# Patient Record
Sex: Female | Born: 1990
Health system: Southern US, Community
[De-identification: ages and names within clinical notes are randomized; demographics above are authoritative.]

## PROBLEM LIST (undated history)

## (undated) ENCOUNTER — Inpatient Hospital Stay (HOSPITAL_COMMUNITY): Payer: Self-pay

## (undated) DIAGNOSIS — O139 Gestational [pregnancy-induced] hypertension without significant proteinuria, unspecified trimester: Secondary | ICD-10-CM

## (undated) DIAGNOSIS — F5081 Binge eating disorder, mild: Secondary | ICD-10-CM

## (undated) DIAGNOSIS — L709 Acne, unspecified: Secondary | ICD-10-CM

## (undated) DIAGNOSIS — O169 Unspecified maternal hypertension, unspecified trimester: Secondary | ICD-10-CM

## (undated) DIAGNOSIS — F909 Attention-deficit hyperactivity disorder, unspecified type: Secondary | ICD-10-CM

## (undated) DIAGNOSIS — N939 Abnormal uterine and vaginal bleeding, unspecified: Secondary | ICD-10-CM

## (undated) DIAGNOSIS — N809 Endometriosis, unspecified: Secondary | ICD-10-CM

## (undated) HISTORY — DX: Endometriosis, unspecified: N80.9

## (undated) HISTORY — DX: Unspecified maternal hypertension, unspecified trimester: O16.9

## (undated) HISTORY — PX: NO PAST SURGERIES: SHX2092

---

## 2013-04-22 LAB — OB RESULTS CONSOLE GC/CHLAMYDIA
Chlamydia: NEGATIVE
GC PROBE AMP, GENITAL: NEGATIVE

## 2013-04-22 LAB — OB RESULTS CONSOLE ANTIBODY SCREEN: ANTIBODY SCREEN: NEGATIVE

## 2013-04-22 LAB — OB RESULTS CONSOLE RUBELLA ANTIBODY, IGM: Rubella: IMMUNE

## 2013-04-22 LAB — OB RESULTS CONSOLE RPR: RPR: NONREACTIVE

## 2013-04-22 LAB — OB RESULTS CONSOLE HEPATITIS B SURFACE ANTIGEN: Hepatitis B Surface Ag: NEGATIVE

## 2013-04-22 LAB — OB RESULTS CONSOLE HIV ANTIBODY (ROUTINE TESTING): HIV: NONREACTIVE

## 2013-10-29 LAB — OB RESULTS CONSOLE GBS: GBS: NEGATIVE

## 2013-11-18 ENCOUNTER — Inpatient Hospital Stay (HOSPITAL_COMMUNITY)
Admission: AD | Admit: 2013-11-18 | Discharge: 2013-11-18 | Disposition: A | Discharge status: Home / Self Care | Payer: BC Managed Care – PPO | Source: Ambulatory Visit | Attending: Obstetrics and Gynecology | Admitting: Obstetrics and Gynecology

## 2013-11-18 ENCOUNTER — Encounter (HOSPITAL_COMMUNITY): Payer: Self-pay

## 2013-11-18 ENCOUNTER — Inpatient Hospital Stay (HOSPITAL_COMMUNITY)
Admission: AD | Admit: 2013-11-18 | Discharge: 2013-11-21 | DRG: 775 | Disposition: A | Discharge status: Home / Self Care | Payer: BC Managed Care – PPO | Source: Ambulatory Visit | Attending: Obstetrics & Gynecology | Admitting: Obstetrics & Gynecology

## 2013-11-18 ENCOUNTER — Encounter (HOSPITAL_COMMUNITY): Payer: Self-pay | Admitting: Anesthesiology

## 2013-11-18 DIAGNOSIS — Z6837 Body mass index (BMI) 37.0-37.9, adult: Secondary | ICD-10-CM | POA: Diagnosis not present

## 2013-11-18 DIAGNOSIS — O163 Unspecified maternal hypertension, third trimester: Secondary | ICD-10-CM

## 2013-11-18 DIAGNOSIS — O99214 Obesity complicating childbirth: Secondary | ICD-10-CM

## 2013-11-18 DIAGNOSIS — O139 Gestational [pregnancy-induced] hypertension without significant proteinuria, unspecified trimester: Secondary | ICD-10-CM | POA: Diagnosis present

## 2013-11-18 DIAGNOSIS — Z8249 Family history of ischemic heart disease and other diseases of the circulatory system: Secondary | ICD-10-CM | POA: Diagnosis not present

## 2013-11-18 DIAGNOSIS — O133 Gestational [pregnancy-induced] hypertension without significant proteinuria, third trimester: Secondary | ICD-10-CM | POA: Diagnosis present

## 2013-11-18 DIAGNOSIS — E669 Obesity, unspecified: Secondary | ICD-10-CM | POA: Diagnosis present

## 2013-11-18 DIAGNOSIS — O169 Unspecified maternal hypertension, unspecified trimester: Secondary | ICD-10-CM

## 2013-11-18 DIAGNOSIS — R030 Elevated blood-pressure reading, without diagnosis of hypertension: Secondary | ICD-10-CM | POA: Diagnosis present

## 2013-11-18 LAB — URINALYSIS, ROUTINE W REFLEX MICROSCOPIC
Bilirubin Urine: NEGATIVE
Glucose, UA: NEGATIVE mg/dL
Ketones, ur: NEGATIVE mg/dL
NITRITE: NEGATIVE
PROTEIN: NEGATIVE mg/dL
Urobilinogen, UA: 0.2 mg/dL (ref 0.0–1.0)
pH: 6.5 (ref 5.0–8.0)

## 2013-11-18 LAB — CBC
HEMATOCRIT: 33.9 % — AB (ref 36.0–46.0)
Hemoglobin: 11.2 g/dL — ABNORMAL LOW (ref 12.0–15.0)
MCH: 30.4 pg (ref 26.0–34.0)
MCHC: 33 g/dL (ref 30.0–36.0)
MCV: 91.9 fL (ref 78.0–100.0)
PLATELETS: 206 10*3/uL (ref 150–400)
RBC: 3.69 MIL/uL — ABNORMAL LOW (ref 3.87–5.11)
RDW: 15.3 % (ref 11.5–15.5)
WBC: 12.1 10*3/uL — AB (ref 4.0–10.5)

## 2013-11-18 LAB — COMPREHENSIVE METABOLIC PANEL
ALBUMIN: 2.5 g/dL — AB (ref 3.5–5.2)
ALK PHOS: 125 U/L — AB (ref 39–117)
ALT: 9 U/L (ref 0–35)
AST: 15 U/L (ref 0–37)
Anion gap: 14 (ref 5–15)
BUN: 8 mg/dL (ref 6–23)
CO2: 20 mEq/L (ref 19–32)
Calcium: 9.3 mg/dL (ref 8.4–10.5)
Chloride: 103 mEq/L (ref 96–112)
Creatinine, Ser: 0.65 mg/dL (ref 0.50–1.10)
GFR calc Af Amer: >90 mL/min (ref >90)
GFR calc non Af Amer: >90 mL/min (ref >90)
GLUCOSE: 79 mg/dL (ref 70–99)
POTASSIUM: 3.6 meq/L — AB (ref 3.7–5.3)
Sodium: 137 mEq/L (ref 137–147)
TOTAL PROTEIN: 6.5 g/dL (ref 6.0–8.3)
Total Bilirubin: 0.2 mg/dL — ABNORMAL LOW (ref 0.3–1.2)

## 2013-11-18 LAB — URINE MICROSCOPIC-ADD ON

## 2013-11-18 LAB — URIC ACID: Uric Acid, Serum: 4.1 mg/dL (ref 2.4–7.0)

## 2013-11-18 LAB — LACTATE DEHYDROGENASE: LDH: 297 U/L — ABNORMAL HIGH (ref 94–250)

## 2013-11-18 MED ORDER — FLEET ENEMA 7-19 GM/118ML RE ENEM: 1.0000 | ENEMA | RECTAL | Status: DC | PRN

## 2013-11-18 MED ORDER — OXYTOCIN 40 UNITS IN LACTATED RINGERS INFUSION - SIMPLE MED
62.5000 mL/h | INTRAVENOUS | Status: DC
  Filled 2013-11-18: qty 1000

## 2013-11-18 MED ORDER — OXYTOCIN BOLUS FROM INFUSION
500.0000 mL | INTRAVENOUS | Status: DC
  Administered 2013-11-19: 500 mL via INTRAVENOUS

## 2013-11-18 MED ORDER — OXYCODONE-ACETAMINOPHEN 5-325 MG PO TABS: 1.0000 | ORAL_TABLET | ORAL | Status: DC | PRN

## 2013-11-18 MED ORDER — ONDANSETRON HCL 4 MG/2ML IJ SOLN: 4.0000 mg | Freq: Four times a day (QID) | INTRAMUSCULAR | Status: DC | PRN

## 2013-11-18 MED ORDER — TERBUTALINE SULFATE 1 MG/ML IJ SOLN: 0.2500 mg | Freq: Once | INTRAMUSCULAR | Status: AC | PRN

## 2013-11-18 MED ORDER — LACTATED RINGERS IV SOLN
INTRAVENOUS | Status: DC
  Administered 2013-11-18: 21:00:00 via INTRAVENOUS
  Administered 2013-11-19: 1000 mL via INTRAVENOUS
  Administered 2013-11-19: 500 mL via INTRAVENOUS
  Administered 2013-11-19: 04:00:00 via INTRAVENOUS

## 2013-11-18 MED ORDER — CITRIC ACID-SODIUM CITRATE 334-500 MG/5ML PO SOLN: 30.0000 mL | ORAL | Status: DC | PRN

## 2013-11-18 MED ORDER — LACTATED RINGERS IV SOLN: 500.0000 mL | INTRAVENOUS | Status: DC | PRN

## 2013-11-18 MED ORDER — MISOPROSTOL 25 MCG QUARTER TABLET
25.0000 ug | ORAL_TABLET | ORAL | Status: DC | PRN
Start: 2013-11-18 — End: 2013-11-19
  Administered 2013-11-18 – 2013-11-19 (×3): 25 ug via VAGINAL
  Filled 2013-11-18: qty 1
  Filled 2013-11-18 (×3): qty 0.25

## 2013-11-18 MED ORDER — LIDOCAINE HCL (PF) 1 % IJ SOLN
30.0000 mL | INTRAMUSCULAR | Status: DC | PRN
  Filled 2013-11-18: qty 30

## 2013-11-18 MED ORDER — ACETAMINOPHEN 325 MG PO TABS
650.0000 mg | ORAL_TABLET | ORAL | Status: DC | PRN
Start: 2013-11-18 — End: 2013-11-19

## 2013-11-18 MED ORDER — OXYCODONE-ACETAMINOPHEN 5-325 MG PO TABS: 2.0000 | ORAL_TABLET | ORAL | Status: DC | PRN

## 2013-11-18 NOTE — H&P (Signed)
Zanylah Hardie is a 23 y.o. female presenting for IOL for gestational hypertension.  The patient has been followed in the office for elevated BPs without proteinuria or neurologic symptoms.  Patient currently has no HA, CP/SOB, RUQ pain, or visual disturbance.  No other antepartum complications.  GBS negative.    Maternal Medical History:  Contractions: Onset was 1 week ago.   Frequency: rare.   Perceived severity is mild.    Fetal activity: Perceived fetal activity is normal.   Last perceived fetal movement was within the past hour.    Prenatal complications: PIH.   Prenatal Complications - Diabetes: none.    OB History   Grav Para Term Preterm Abortions TAB SAB Ect Mult Living   1              Past Medical History  Diagnosis Date  . Medical history non-contributory    Past Surgical History  Procedure Laterality Date  . No past surgeries     Family History: family history includes Hypertension in her father. Social History:  reports that she has never smoked. She has never used smokeless tobacco. She reports that she does not drink alcohol or use illicit drugs.   Prenatal Transfer Tool  Maternal Diabetes: No Genetic Screening: Normal Maternal Ultrasounds/Referrals: Normal Fetal Ultrasounds or other Referrals:  None Maternal Substance Abuse:  No Significant Maternal Medications:  None Significant Maternal Lab Results:  Lab values include: Group B Strep negative Other Comments:  None  ROS    Blood pressure 144/106, pulse 94, temperature 98.3 F (36.8 C), temperature source Oral, resp. rate 18. Maternal Exam:  Uterine Assessment: Contraction strength is mild.  Contraction frequency is rare.   Abdomen: Patient reports no abdominal tenderness. Fundal height is c/w dates.   Estimated fetal weight is 8#.       Physical Exam  Constitutional: She is oriented to person, place, and time. She appears well-developed and well-nourished.  GI: Soft. There is no rebound and  no guarding.  Neurological: She is alert and oriented to person, place, and time.  Skin: Skin is warm and dry.  Psychiatric: She has a normal mood and affect. Her behavior is normal.    Prenatal labs: ABO, Rh:   Antibody: Negative (02/25 0000) Rubella: Immune (02/25 0000) RPR: Nonreactive (02/25 0000)  HBsAg: Negative (02/25 0000)  HIV: Non-reactive (02/25 0000)  GBS: Negative (09/03 0000)   Assessment/Plan: 23yo G1 at [redacted]w[redacted]d with GHTN -IOL with cytotec -Tx BPs prn -Magnesium sulfate for severe range BPs   Randi Poullard 11/18/2013, 9:20 PM

## 2013-11-18 NOTE — MAU Provider Note (Signed)
History     CSN: [id number]  Arrival date and time: 11/18/13 5366   First Provider Initiated Contact with Patient 11/18/13 [id number]      Chief Complaint  Patient presents with  . Hypertension   HPI [name] is a 23 y.o. G1P0 at [redacted]w[redacted]d who presents to MAU today with complaint of elevated blood pressure. The patient states that she has had elevated BP at her last 2 office visits. She states that she woke up early this morning with a headache and checked BP at home. States it was 147/90s. Patient took Tylenol and HA has resolved. She denies abdominal pain, blurred vision, floaters or change in peripheral edema. She states mild edema today. She denies vaginal bleeding or LOF. She endorses a mucus discharge. She states occasional, irregular contractions. She reports good fetal movement.   OB History   Grav Para Term Preterm Abortions TAB SAB Ect Mult Living   1               Past Medical History  Diagnosis Date  . Medical history non-contributory     Past Surgical History  Procedure Laterality Date  . No past surgeries      No family history on file.  History  Substance Use Topics  . Smoking status: Never Smoker   . Smokeless tobacco: Never Used  . Alcohol Use: No    Allergies: No Known Allergies  No prescriptions prior to admission    Review of Systems  Constitutional: Negative for fever and malaise/fatigue.  Eyes: Negative for blurred vision.       Neg - floaters  Gastrointestinal: Negative for abdominal pain.  Genitourinary:       Neg - vaginal bleeding, LOF  Neurological: Negative for headaches.   Physical Exam   Blood pressure 129/89, pulse 81, temperature 98 F (36.7 C), temperature source Oral, resp. rate 18, height 5\' 3"  (1.6 m), weight 219 lb 12.8 oz (99.701 kg).  Physical Exam  Constitutional: She is oriented to person, place, and time. She appears well-developed and well-nourished. No distress.  HENT:  Head: Normocephalic.  Cardiovascular:  Normal rate.   Respiratory: Effort normal.  GI: Soft. She exhibits no distension and no mass. There is no tenderness. There is no rebound and no guarding.  Neurological: She is alert and oriented to person, place, and time. She has normal reflexes.  No clonus  Skin: Skin is warm and dry. No erythema.  Psychiatric: She has a normal mood and affect.    Patient Vitals for the past 24 hrs:  BP Temp Temp src Pulse Resp Height Weight  11/18/13 0605 129/89 mmHg - - 81 - - -  11/18/13 0550 120/81 mmHg - - 93 - - -  11/18/13 0537 141/93 mmHg - - 95 - - -  11/18/13 0535 139/93 mmHg - - 85 - - -  11/18/13 0528 148/102 mmHg 98 F (36.7 C) Oral 90 18 - -  11/18/13 0520 - - - - - 5\' 3"  (1.6 m) 219 lb 12.8 oz (99.701 kg)    Results for orders placed during the hospital encounter of 11/18/13 (from the past 24 hour(s))  URINALYSIS, ROUTINE W REFLEX MICROSCOPIC     Status: Abnormal   Collection Time    11/18/13  5:18 AM      Result Value Ref Range   Color, Urine YELLOW  YELLOW   APPearance CLEAR  CLEAR   Specific Gravity, Urine <1.005 (*) 1.005 - 1.030   pH  6.5  5.0 - 8.0   Glucose, UA NEGATIVE  NEGATIVE mg/dL   Hgb urine dipstick TRACE (*) NEGATIVE   Bilirubin Urine NEGATIVE  NEGATIVE   Ketones, ur NEGATIVE  NEGATIVE mg/dL   Protein, ur NEGATIVE  NEGATIVE mg/dL   Urobilinogen, UA 0.2  0.0 - 1.0 mg/dL   Nitrite NEGATIVE  NEGATIVE   Leukocytes, UA MODERATE (*) NEGATIVE  URINE MICROSCOPIC-ADD ON     Status: None   Collection Time    11/18/13  5:18 AM      Result Value Ref Range   Squamous Epithelial / LPF RARE  RARE   WBC, UA 7-10  <3 WBC/hpf   Bacteria, UA RARE  RARE   Fetal Monitoring: Baseline: 130 bpm, moderate variability, +accelerations, no decelerations Contractions: irregular  MAU Course  Procedures None  MDM UA today Discussed with Dr. Corinna Capra. Ok for discharge with HTN precautions. Follow-up in the office later today for BP check and possibly schedule induction.    Assessment and Plan  A: SIUP at [redacted]w[redacted]d Gestational hypertension  P: Discharge home Pre-eclampsia and labor precautions discussed Patient advised to call the office today for a follow-up appointment this afternoon Patient may return to MAU as needed or if her condition were to change or worsen   Megan Redden, PA-C  11/18/2013, 6:17 AM

## 2013-11-18 NOTE — Discharge Instructions (Signed)
°Hypertension During Pregnancy °Hypertension is also called high blood pressure. Blood pressure moves blood in your body. Sometimes, the force that moves the blood becomes too strong. When you are pregnant, this condition should be watched carefully. It can cause problems for you and your baby. °HOME CARE  °· Make and keep all of your doctor visits. °· Take medicine as told by your doctor. Tell your doctor about all medicines you take. °· Eat very little salt. °· Exercise regularly. °· Do not drink alcohol. °· Do not smoke. °· Do not have drinks with caffeine. °· Lie on your left side when resting. °· Your health care provider may ask you to take one low-dose aspirin (81mg) each day. °GET HELP RIGHT AWAY IF: °· You have bad belly (abdominal) pain. °· You have sudden puffiness (swelling) in the hands, ankles, or face. °· You gain 4 pounds (1.8 kilograms) or more in 1 week. °· You throw up (vomit) repeatedly. °· You have bleeding from the vagina. °· You do not feel the baby moving as much. °· You have a headache. °· You have blurred or double vision. °· You have muscle twitching or spasms. °· You have shortness of breath. °· You have blue fingernails and lips. °· You have blood in your pee (urine). °MAKE SURE YOU: °· Understand these instructions. °· Will watch your condition. °· Will get help right away if you are not doing well or get worse. °Document Released: 03/17/2010 Document Revised: 06/29/2013 Document Reviewed: 09/11/2012 °Exit[employer]® Patient Information ©2015 [employer]. This information is not intended to replace advice given to you by your health care provider. Make sure you discuss any questions you have with your health care provider. ° ° °Preeclampsia and Eclampsia °Preeclampsia is a serious condition that develops only during pregnancy. It is also called toxemia of pregnancy. This condition causes high blood pressure along with other symptoms, such as swelling and headaches. These may develop as the  condition gets worse. Preeclampsia may occur 20 weeks or later into your pregnancy.  °Diagnosing and treating preeclampsia early is very important. If not treated early, it can cause serious problems for you and your baby. One problem it can lead to is eclampsia, which is a condition that causes muscle jerking or shaking (convulsions) in the mother. Delivering your baby is the best treatment for preeclampsia or eclampsia.  °RISK FACTORS °The cause of preeclampsia is not known. You may be more likely to develop preeclampsia if you have certain risk factors. These include:  °· Being pregnant for the first time. °· Having preeclampsia in a past pregnancy. °· Having a family history of preeclampsia. °· Having high blood pressure. °· Being pregnant with twins or triplets. °· Being 35 or older. °· Being African American. °· Having kidney disease or diabetes. °· Having medical conditions such as lupus or blood diseases. °· Being very overweight (obese). °SIGNS AND SYMPTOMS  °The earliest signs of preeclampsia are: °· High blood pressure. °· Increased protein in your urine. Your health care provider will check for this at every prenatal visit. °Other symptoms that can develop include:  °· Severe headaches. °· Sudden weight gain. °· Swelling of your hands, face, legs, and feet. °· Feeling sick to your stomach (nauseous) and throwing up (vomiting). °· Vision problems (blurred or double vision). °· Numbness in your face, arms, legs, and feet. °· Dizziness. °· Slurred speech. °· Sensitivity to bright lights. °· Abdominal pain. °DIAGNOSIS  °There are no screening tests for preeclampsia. Your health   care provider will ask you about symptoms and check for signs of preeclampsia during your prenatal visits. You may also have tests, including: °· Urine testing. °· Blood testing. °· Checking your baby's heart rate. °· Checking the health of your baby and your placenta using images created with sound waves (ultrasound). °TREATMENT    °You can work out the best treatment approach together with your health care provider. It is very important to keep all prenatal appointments. If you have an increased risk of preeclampsia, you may need more frequent prenatal exams. °· Your health care provider may prescribe bed rest. °· You may have to eat as little salt as possible. °· You may need to take medicine to lower your blood pressure if the condition does not respond to more conservative measures. °· You may need to stay in the hospital if your condition is severe. There, treatment will focus on controlling your blood pressure and fluid retention. You may also need to take medicine to prevent seizures. °· If the condition gets worse, your baby may need to be delivered early to protect you and the baby. You may have your labor started with medicine (be induced), or you may have a cesarean delivery. °· Preeclampsia usually goes away after the baby is born. °HOME CARE INSTRUCTIONS  °· Only take over-the-counter or prescription medicines as directed by your health care provider. °· Lie on your left side while resting. This keeps pressure off your baby. °· Elevate your feet while resting. °· Get regular exercise. Ask your health care provider what type of exercise is safe for you. °· Avoid caffeine and alcohol. °· Do not smoke. °· Drink 6-8 glasses of water every day. °· Eat a balanced diet that is low in salt. Do not add salt to your food. °· Avoid stressful situations as much as possible. °· Get plenty of rest and sleep. °· Keep all prenatal appointments and tests as scheduled. °SEEK MEDICAL CARE IF: °· You are gaining more weight than expected. °· You have any headaches, abdominal pain, or nausea. °· You are bruising more than usual. °· You feel dizzy or light-headed. °SEEK IMMEDIATE MEDICAL CARE IF:  °· You develop sudden or severe swelling anywhere in your body. This usually happens in the legs. °· You gain 5 lb (2.3 kg) or more in a week. °· You have a  severe headache, dizziness, problems with your vision, or confusion. °· You have severe abdominal pain. °· You have lasting nausea or vomiting. °· You have a seizure. °· You have trouble moving any part of your body. °· You develop numbness in your body. °· You have trouble speaking. °· You have any abnormal bleeding. °· You develop a stiff neck. °· You pass out. °MAKE SURE YOU:  °· Understand these instructions. °· Will watch your condition. °· Will get help right away if you are not doing well or get worse. °Document Released: 02/10/2000 Document Revised: 02/17/2013 Document Reviewed: 12/05/2012 °Exit[employer]® Patient Information ©2015 [employer]. This information is not intended to replace advice given to you by your health care provider. Make sure you discuss any questions you have with your health care provider. ° °

## 2013-11-18 NOTE — MAU Note (Signed)
Pt states BP was 140s/90s at home. Had a headache that went away with tylenol. Denies vision changes or RUQ pain. Denies LOF or vag bleeding. +FM

## 2013-11-19 ENCOUNTER — Encounter (HOSPITAL_COMMUNITY): Payer: BC Managed Care – PPO | Admitting: Anesthesiology

## 2013-11-19 ENCOUNTER — Inpatient Hospital Stay (HOSPITAL_COMMUNITY): Payer: BC Managed Care – PPO | Admitting: Anesthesiology

## 2013-11-19 ENCOUNTER — Encounter (HOSPITAL_COMMUNITY): Payer: Self-pay | Admitting: *Deleted

## 2013-11-19 DIAGNOSIS — O139 Gestational [pregnancy-induced] hypertension without significant proteinuria, unspecified trimester: Secondary | ICD-10-CM

## 2013-11-19 LAB — COMPREHENSIVE METABOLIC PANEL
ALK PHOS: 93 U/L (ref 39–117)
ALK PHOS: 88 U/L (ref 39–117)
ALT: 9 U/L (ref 0–35)
ALT: 9 U/L (ref 0–35)
ANION GAP: 16 — AB (ref 5–15)
AST: 15 U/L (ref 0–37)
AST: 19 U/L (ref 0–37)
Albumin: 2.4 g/dL — ABNORMAL LOW (ref 3.5–5.2)
Albumin: 2.2 g/dL — ABNORMAL LOW (ref 3.5–5.2)
Anion gap: 16 — ABNORMAL HIGH (ref 5–15)
BILIRUBIN TOTAL: 0.3 mg/dL (ref 0.3–1.2)
BUN: 7 mg/dL (ref 6–23)
BUN: 8 mg/dL (ref 6–23)
CHLORIDE: 103 meq/L (ref 96–112)
CHLORIDE: 103 meq/L (ref 96–112)
CO2: 19 mEq/L (ref 19–32)
CO2: 18 meq/L — AB (ref 19–32)
CREATININE: 0.64 mg/dL (ref 0.50–1.10)
Calcium: 8.5 mg/dL (ref 8.4–10.5)
Calcium: 8.3 mg/dL — ABNORMAL LOW (ref 8.4–10.5)
Creatinine, Ser: 0.67 mg/dL (ref 0.50–1.10)
GFR calc Af Amer: >90 mL/min (ref >90)
GLUCOSE: 104 mg/dL — AB (ref 70–99)
Glucose, Bld: 73 mg/dL (ref 70–99)
POTASSIUM: 3.7 meq/L (ref 3.7–5.3)
POTASSIUM: 4.1 meq/L (ref 3.7–5.3)
SODIUM: 137 meq/L (ref 137–147)
Sodium: 138 mEq/L (ref 137–147)
Total Bilirubin: 0.3 mg/dL (ref 0.3–1.2)
Total Protein: 5.8 g/dL — ABNORMAL LOW (ref 6.0–8.3)
Total Protein: 5.5 g/dL — ABNORMAL LOW (ref 6.0–8.3)

## 2013-11-19 LAB — CBC
HCT: 32.1 % — ABNORMAL LOW (ref 36.0–46.0)
HEMATOCRIT: 34.1 % — AB (ref 36.0–46.0)
Hemoglobin: 11.1 g/dL — ABNORMAL LOW (ref 12.0–15.0)
Hemoglobin: 10.6 g/dL — ABNORMAL LOW (ref 12.0–15.0)
MCH: 29.9 pg (ref 26.0–34.0)
MCH: 30.1 pg (ref 26.0–34.0)
MCHC: 32.6 g/dL (ref 30.0–36.0)
MCHC: 33 g/dL (ref 30.0–36.0)
MCV: 91.9 fL (ref 78.0–100.0)
MCV: 91.2 fL (ref 78.0–100.0)
PLATELETS: 198 10*3/uL (ref 150–400)
Platelets: 177 10*3/uL (ref 150–400)
RBC: 3.71 MIL/uL — AB (ref 3.87–5.11)
RBC: 3.52 MIL/uL — ABNORMAL LOW (ref 3.87–5.11)
RDW: 15.3 % (ref 11.5–15.5)
RDW: 15.5 % (ref 11.5–15.5)
WBC: 13.5 10*3/uL — AB (ref 4.0–10.5)
WBC: 17.3 10*3/uL — ABNORMAL HIGH (ref 4.0–10.5)

## 2013-11-19 LAB — RPR

## 2013-11-19 LAB — MRSA PCR SCREENING: MRSA BY PCR: NEGATIVE

## 2013-11-19 MED ORDER — FENTANYL 2.5 MCG/ML BUPIVACAINE 1/10 % EPIDURAL INFUSION (WH - ANES)
14.0000 mL/h | INTRAMUSCULAR | Status: DC | PRN
  Administered 2013-11-19: 14 mL/h via EPIDURAL

## 2013-11-19 MED ORDER — DIBUCAINE 1 % RE OINT: 1.0000 "application " | TOPICAL_OINTMENT | RECTAL | Status: DC | PRN

## 2013-11-19 MED ORDER — TERBUTALINE SULFATE 1 MG/ML IJ SOLN: 0.2500 mg | Freq: Once | INTRAMUSCULAR | Status: DC | PRN

## 2013-11-19 MED ORDER — EPHEDRINE 5 MG/ML INJ
10.0000 mg | INTRAVENOUS | Status: DC | PRN
  Filled 2013-11-19: qty 2

## 2013-11-19 MED ORDER — DIPHENHYDRAMINE HCL 25 MG PO CAPS: 25.0000 mg | ORAL_CAPSULE | Freq: Four times a day (QID) | ORAL | Status: DC | PRN

## 2013-11-19 MED ORDER — ZOLPIDEM TARTRATE 5 MG PO TABS: 5.0000 mg | ORAL_TABLET | Freq: Every evening | ORAL | Status: DC | PRN

## 2013-11-19 MED ORDER — WITCH HAZEL-GLYCERIN EX PADS: 1.0000 "application " | MEDICATED_PAD | CUTANEOUS | Status: DC | PRN

## 2013-11-19 MED ORDER — PHENYLEPHRINE 40 MCG/ML (10ML) SYRINGE FOR IV PUSH (FOR BLOOD PRESSURE SUPPORT)
80.0000 ug | PREFILLED_SYRINGE | INTRAVENOUS | Status: DC | PRN
  Filled 2013-11-19: qty 2

## 2013-11-19 MED ORDER — BENZOCAINE-MENTHOL 20-0.5 % EX AERO
1.0000 "application " | INHALATION_SPRAY | CUTANEOUS | Status: DC | PRN
  Administered 2013-11-19 – 2013-11-21 (×3): 1 via TOPICAL
  Filled 2013-11-19 (×3): qty 56

## 2013-11-19 MED ORDER — MAGNESIUM SULFATE BOLUS VIA INFUSION
4.0000 g | Freq: Once | INTRAVENOUS | Status: DC
  Filled 2013-11-19: qty 500

## 2013-11-19 MED ORDER — FLEET ENEMA 7-19 GM/118ML RE ENEM: 1.0000 | ENEMA | Freq: Every day | RECTAL | Status: DC | PRN

## 2013-11-19 MED ORDER — FENTANYL 2.5 MCG/ML BUPIVACAINE 1/10 % EPIDURAL INFUSION (WH - ANES): 14.0000 mL/h | INTRAMUSCULAR | Status: DC | PRN

## 2013-11-19 MED ORDER — BUTORPHANOL TARTRATE 1 MG/ML IJ SOLN
1.0000 mg | INTRAMUSCULAR | Status: DC | PRN
  Administered 2013-11-19: 1 mg via INTRAVENOUS
  Filled 2013-11-19: qty 1

## 2013-11-19 MED ORDER — SENNOSIDES-DOCUSATE SODIUM 8.6-50 MG PO TABS
2.0000 | ORAL_TABLET | ORAL | Status: DC
  Administered 2013-11-20 – 2013-11-21 (×2): 2 via ORAL
  Filled 2013-11-19 (×2): qty 2

## 2013-11-19 MED ORDER — BISACODYL 10 MG RE SUPP: 10.0000 mg | Freq: Every day | RECTAL | Status: DC | PRN

## 2013-11-19 MED ORDER — LIDOCAINE HCL (PF) 1 % IJ SOLN
INTRAMUSCULAR | Status: DC | PRN
Start: 2013-11-19 — End: 2013-11-22
  Administered 2013-11-19: 3 mL
  Administered 2013-11-19: 4 mL
  Administered 2013-11-19: 6 mL

## 2013-11-19 MED ORDER — ONDANSETRON HCL 4 MG/2ML IJ SOLN: 4.0000 mg | INTRAMUSCULAR | Status: DC | PRN

## 2013-11-19 MED ORDER — OXYCODONE-ACETAMINOPHEN 5-325 MG PO TABS: 2.0000 | ORAL_TABLET | ORAL | Status: DC | PRN

## 2013-11-19 MED ORDER — SIMETHICONE 80 MG PO CHEW
80.0000 mg | CHEWABLE_TABLET | ORAL | Status: DC | PRN
Start: 2013-11-19 — End: 2013-11-21

## 2013-11-19 MED ORDER — MAGNESIUM SULFATE 40 G IN LACTATED RINGERS - SIMPLE
2.0000 g/h | INTRAVENOUS | Status: DC
  Administered 2013-11-19: 4 g/h via INTRAVENOUS
  Administered 2013-11-19: 2 g/h via INTRAVENOUS
  Filled 2013-11-19: qty 500

## 2013-11-19 MED ORDER — OXYCODONE-ACETAMINOPHEN 5-325 MG PO TABS: 1.0000 | ORAL_TABLET | ORAL | Status: DC | PRN

## 2013-11-19 MED ORDER — LANOLIN HYDROUS EX OINT: TOPICAL_OINTMENT | CUTANEOUS | Status: DC | PRN

## 2013-11-19 MED ORDER — MAGNESIUM SULFATE 40 G IN LACTATED RINGERS - SIMPLE
2.0000 g/h | INTRAVENOUS | Status: DC
  Filled 2013-11-19: qty 500

## 2013-11-19 MED ORDER — TETANUS-DIPHTH-ACELL PERTUSSIS 5-2.5-18.5 LF-MCG/0.5 IM SUSP
0.5000 mL | Freq: Once | INTRAMUSCULAR | Status: DC
  Filled 2013-11-19: qty 0.5

## 2013-11-19 MED ORDER — LACTATED RINGERS IV SOLN: 500.0000 mL | Freq: Once | INTRAVENOUS | Status: DC

## 2013-11-19 MED ORDER — PRENATAL MULTIVITAMIN CH
1.0000 | ORAL_TABLET | Freq: Every day | ORAL | Status: DC
  Administered 2013-11-20 – 2013-11-21 (×2): 1 via ORAL
  Filled 2013-11-19 (×2): qty 1

## 2013-11-19 MED ORDER — OXYTOCIN 40 UNITS IN LACTATED RINGERS INFUSION - SIMPLE MED: 1.0000 m[IU]/min | INTRAVENOUS | Status: DC

## 2013-11-19 MED ORDER — ONDANSETRON HCL 4 MG PO TABS: 4.0000 mg | ORAL_TABLET | ORAL | Status: DC | PRN

## 2013-11-19 MED ORDER — FENTANYL 2.5 MCG/ML BUPIVACAINE 1/10 % EPIDURAL INFUSION (WH - ANES)
INTRAMUSCULAR | Status: AC
  Filled 2013-11-19: qty 125

## 2013-11-19 MED ORDER — DIPHENHYDRAMINE HCL 50 MG/ML IJ SOLN: 12.5000 mg | INTRAMUSCULAR | Status: DC | PRN

## 2013-11-19 MED ORDER — IBUPROFEN 600 MG PO TABS
600.0000 mg | ORAL_TABLET | Freq: Four times a day (QID) | ORAL | Status: DC
  Administered 2013-11-19 – 2013-11-21 (×8): 600 mg via ORAL
  Filled 2013-11-19 (×8): qty 1

## 2013-11-19 MED ORDER — BUPIVACAINE HCL (PF) 0.25 % IJ SOLN
INTRAMUSCULAR | Status: DC | PRN
  Administered 2013-11-19: 3 mL

## 2013-11-19 MED ORDER — LACTATED RINGERS IV SOLN
INTRAVENOUS | Status: DC
  Administered 2013-11-19 – 2013-11-20 (×2): via INTRAVENOUS

## 2013-11-19 MED ORDER — PHENYLEPHRINE 40 MCG/ML (10ML) SYRINGE FOR IV PUSH (FOR BLOOD PRESSURE SUPPORT)
PREFILLED_SYRINGE | INTRAVENOUS | Status: AC
  Filled 2013-11-19: qty 10

## 2013-11-19 NOTE — Anesthesia Procedure Notes (Addendum)
Epidural Patient location during procedure: OB  Preanesthetic Checklist Completed: patient identified, site marked, surgical consent, pre-op evaluation, timeout performed, IV checked, risks and benefits discussed and monitors and equipment checked  Epidural Patient position: sitting Prep: site prepped and draped and DuraPrep Patient monitoring: continuous pulse ox and blood pressure Approach: midline Injection technique: LOR air  Needle:  Needle type: Tuohy  Needle gauge: 17 G Needle length: 9 cm and 9 Needle insertion depth: 7 cm Catheter type: closed end flexible Catheter size: 19 Gauge Catheter at skin depth: 14 cm Test dose: negative  Assessment Events: blood not aspirated, injection not painful, no injection resistance, negative IV test and no paresthesia  Additional Notes Dosing of Epidural:  1st dose, through catheter ............................................Marland Kitchen  Xylocaine 40 mg  2nd dose, through catheter, after waiting 3 minutes........Marland KitchenXylocaine 60 mg    ( 1% Xylo charted as a single dose in Epic Meds for ease of charting; actual dosing was fractionated as above, for saftey's sake)  As each dose occurred, patient was free of IV sx; and patient exhibited no evidence of SA injection.  Patient is  dosed. Please see RN's note for documentation of vital signs,and FHR which are stable.  Patient reminded not to try to ambulate with numb legs, and that an RN must be present when she attempts to get up.      Epidural Patient location during procedure: OB Start time: 11/19/2013 12:41 PM  Preanesthetic Checklist Completed: patient identified, site marked, surgical consent, pre-op evaluation, timeout performed, IV checked, risks and benefits discussed and monitors and equipment checked  Epidural Patient position: sitting Prep: site prepped and draped and DuraPrep Patient monitoring: continuous pulse ox and blood pressure Approach: midline Location:  L3-L4 Injection technique: LOR air  Needle:  Needle type: Tuohy  Needle gauge: 17 G Needle length: 9 cm and 9 Needle insertion depth: 6 cm Catheter type: closed end flexible Catheter size: 19 Gauge Catheter at skin depth: 13 cm Test dose: negative  Assessment Events: blood not aspirated, injection not painful, no injection resistance, negative IV test and no paresthesia  Additional Notes Second epidural attempt at (L4-5) 1 level below the first site. LOR was unremarkable, and distinct. [place] 25 GA passed through [place], but felt like it stopped shy of exiting the Touhy. Stylette was not removed because I did not feel the "pop" of a SAB puncture, only firm resistance to advancement.   Removing the Whitacre needle resulted in a steady flow of what looked to be CSF.  The catheter threaded easily without paresthesia.   Aspiration of the catheter did NOT get CSF. I was unsure if I had a wet tap from the Touhy ( I didn't see CSF) or from the Whitacre that actually did puncture the dura, but I couldn't feel it. In either case I went back to L3-4, and got a second LOR with the Touhy. Then I attempted the Whitacre again, as by this time the patient was saying it felt like she was complete. I dosed the spinal, threaded the catheter easily, and it was (-) for asp of CSF or heme. She got comfortable

## 2013-11-19 NOTE — Progress Notes (Signed)
Delivery Note At 4:29 PM a viable female was delivered via  (Presentation:ROA ;  ).  APGAR:9 , 9; weight pending .   Placenta status: intact,to path .  Cord:3 vessels  with the following complications: .  Cord pH: pending  Anesthesia: epidural   Episiotomy: none Lacerations: evulsion of lower 1/2 of right labia minora with an arteriolar bleeder- ligated and repaired Suture Repair: 2.0 vicryl rapide Est. Blood Loss (mL): 500  Mom to AICU.  Baby to Couplet care / Skin to Skin PP magnesium sulfate.  Latysha Thackston II,Mick Tanguma E 11/19/2013, 4:54 PM

## 2013-11-19 NOTE — Anesthesia Preprocedure Evaluation (Signed)
Anesthesia Evaluation  Patient identified by MRN, date of birth, ID band Patient awake    Reviewed: Allergy & Precautions, H&P , Patient's Chart, lab work & pertinent test results  Airway Mallampati: III TM Distance: >3 FB Neck ROM: full    Dental  (+) Teeth Intact   Pulmonary  breath sounds clear to auscultation        Cardiovascular hypertension, Rhythm:regular Rate:Normal     Neuro/Psych    GI/Hepatic   Endo/Other  Morbid obesity  Renal/GU      Musculoskeletal   Abdominal   Peds  Hematology   Anesthesia Other Findings       Reproductive/Obstetrics (+) Pregnancy                           Anesthesia Physical Anesthesia Plan  ASA: III  Anesthesia Plan: Epidural   Post-op Pain Management:    Induction:   Airway Management Planned:   Additional Equipment:   Intra-op Plan:   Post-operative Plan:   Informed Consent: I have reviewed the patients History and Physical, chart, labs and discussed the procedure including the risks, benefits and alternatives for the proposed anesthesia with the patient or authorized representative who has indicated his/her understanding and acceptance.   Dental Advisory Given  Plan Discussed with:   Anesthesia Plan Comments: (Labs checked- platelets confirmed with RN in room. Fetal heart tracing, per RN, reported to be stable enough for sitting procedure. Discussed epidural, and patient consents to the procedure:  included risk of possible headache,backache, failed block, allergic reaction, and nerve injury. This patient was asked if she had any questions or concerns before the procedure started.)        Anesthesia Quick Evaluation

## 2013-11-19 NOTE — Progress Notes (Signed)
Dr Gaetano Net notified of Maternal BPs ranging from 150-170's/100-110's. Informed physician of second attempt of epidural. Order for Mag 4Gm bolu/2Gm/ hr, then continue to monitor for IV antihypertensives.

## 2013-11-19 NOTE — Progress Notes (Signed)
No HA, no epigastric pain  Filed Vitals:   11/19/13 0734  BP: 155/98  Pulse: 80  Temp:   Resp:    Lungs CTA  DTR 2+ without clonus epigastrum NT  Cx 3/70/-2/vtx AROM clear FHT +accels UCs irreg, mod    Cytotec about 6 am  A/P: Repeat labs         Pitocin 4 hours if needed         D/W patient and FOB

## 2013-11-19 NOTE — Progress Notes (Signed)
Second epidural placement just completed No HA, no vision changes  BPs 150-170s/100-110s, 189/158 taken during UC and epidural placement   BP 131/97 after epidural dosed  Cx 9/90/-1 FHT reactive  A/P: will begin magnesium sulfate         Follow BP closely, will treat if goes back up

## 2013-11-20 LAB — CBC
HEMATOCRIT: 23.3 % — AB (ref 36.0–46.0)
HEMOGLOBIN: 7.9 g/dL — AB (ref 12.0–15.0)
MCH: 31 pg (ref 26.0–34.0)
MCHC: 33.9 g/dL (ref 30.0–36.0)
MCV: 91.4 fL (ref 78.0–100.0)
Platelets: 188 10*3/uL (ref 150–400)
RBC: 2.55 MIL/uL — AB (ref 3.87–5.11)
RDW: 15.4 % (ref 11.5–15.5)
WBC: 16.6 10*3/uL — ABNORMAL HIGH (ref 4.0–10.5)

## 2013-11-20 LAB — COMPREHENSIVE METABOLIC PANEL
ALT: 8 U/L (ref 0–35)
AST: 20 U/L (ref 0–37)
Albumin: 1.7 g/dL — ABNORMAL LOW (ref 3.5–5.2)
Alkaline Phosphatase: 66 U/L (ref 39–117)
Anion gap: 10 (ref 5–15)
BUN: 6 mg/dL (ref 6–23)
CHLORIDE: 104 meq/L (ref 96–112)
CO2: 23 meq/L (ref 19–32)
CREATININE: 0.8 mg/dL (ref 0.50–1.10)
Calcium: 7.3 mg/dL — ABNORMAL LOW (ref 8.4–10.5)
GLUCOSE: 134 mg/dL — AB (ref 70–99)
Potassium: 3.8 mEq/L (ref 3.7–5.3)
Sodium: 137 mEq/L (ref 137–147)
Total Protein: 4.4 g/dL — ABNORMAL LOW (ref 6.0–8.3)

## 2013-11-20 LAB — URIC ACID: Uric Acid, Serum: 5.1 mg/dL (ref 2.4–7.0)

## 2013-11-20 MED ORDER — MAGNESIUM SULFATE 40 G IN LACTATED RINGERS - SIMPLE: 2.0000 g/h | INTRAVENOUS | Status: DC

## 2013-11-20 MED ORDER — MAGNESIUM SULFATE 40 G IN LACTATED RINGERS - SIMPLE
2.0000 g/h | INTRAVENOUS | Status: AC
  Administered 2013-11-20: 2 g/h via INTRAVENOUS
  Filled 2013-11-20: qty 500

## 2013-11-20 NOTE — Anesthesia Postprocedure Evaluation (Signed)
Anesthesia Post Note  Patient: Megan Powell  Procedure(s) Performed: * No procedures listed *  Anesthesia type: Epidural  Patient location: Mother/Baby  Post pain: Pain level controlled  Post assessment: Post-op Vital signs reviewed  Last Vitals:  Filed Vitals:   11/20/13 0818  BP: 142/91  Pulse: 90  Temp: 36.9 C  Resp: 18    Post vital signs: Reviewed  Level of consciousness:alert  Complications: No apparent anesthesia complications

## 2013-11-20 NOTE — Lactation Note (Signed)
This note was copied from the chart of [facility]. Lactation Consultation Note  Patient Name: Megan Powell Megan Powell Date: 11/20/2013 Reason for consult: Initial assessment  Initial consult; 67 hrs old.  Infant GA 39.2; 8 lbs; vaginal delivery; EBL-551ml.  Infant has breastfed x7 (20-45 min) + 1 attempt within first 24 hrs of life; voids-4; stools-5 within the first 24 hrs of life.  LS-8 by RN.  Mom states breastfeeding is going well but was wondering if she had any milk.  Taught hand expression with return demonstration and colostrum easily flows from breasts.  Mom was encouraged and excited about the colostrum.  Encouraged to use colostrum on breasts at end of each feeding.  Mom has been reading about breastfeeding and knows a good amount.  Encouraged mom to continue exclusive breastfeeding and feeding with cues.  Educated on size of infant's stomach and cluster feeding.  Mom is dedicated to breastfeeding.  Reports some tenderness.  Comfort gels given and explained use.  LC did not get to see a latch at this visit d/t infant sleeping and mom's supper had arrived.  Encouraged mom to eat.  Lots basic questions answered.  Lactation brochure given and informed of support group and outpatient services.  Encouraged to call for questions as needed.     Maternal Data Formula Feeding for Exclusion: No Has patient been taught Hand Expression?: Yes Does the patient have breastfeeding experience prior to this delivery?: No  Feeding    LATCH Score/Interventions                      Lactation Tools Discussed/Used WIC Program: No   Consult Status Consult Status: Follow-up Date: 11/21/13 Follow-up type: In-patient    Merlene Laughter 11/20/2013, 7:39 PM

## 2013-11-20 NOTE — Progress Notes (Signed)
Feels good  Filed Vitals:   11/20/13 0700  BP: 144/88  Pulse: 104  Temp:   Resp: 18   Up changing baby diaper  Lungs CTA  Results for orders placed during the hospital encounter of 11/18/13 (from the past 24 hour(s))  CBC     Status: Abnormal   Collection Time    11/19/13  8:10 AM      Result Value Ref Range   WBC 13.5 (*) 4.0 - 10.5 K/uL   RBC 3.71 (*) 3.87 - 5.11 MIL/uL   Hemoglobin 11.1 (*) 12.0 - 15.0 g/dL   HCT 34.1 (*) 36.0 - 46.0 %   MCV 91.9  78.0 - 100.0 fL   MCH 29.9  26.0 - 34.0 pg   MCHC 32.6  30.0 - 36.0 g/dL   RDW 15.3  11.5 - 15.5 %   Platelets 177  150 - 400 K/uL  COMPREHENSIVE METABOLIC PANEL     Status: Abnormal   Collection Time    11/19/13  8:10 AM      Result Value Ref Range   Sodium 138  137 - 147 mEq/L   Potassium 3.7  3.7 - 5.3 mEq/L   Chloride 103  96 - 112 mEq/L   CO2 19  19 - 32 mEq/L   Glucose, Bld 73  70 - 99 mg/dL   BUN 7  6 - 23 mg/dL   Creatinine, Ser 0.64  0.50 - 1.10 mg/dL   Calcium 8.5  8.4 - 10.5 mg/dL   Total Protein 5.8 (*) 6.0 - 8.3 g/dL   Albumin 2.4 (*) 3.5 - 5.2 g/dL   AST 15  0 - 37 U/L   ALT 9  0 - 35 U/L   Alkaline Phosphatase 93  39 - 117 U/L   Total Bilirubin 0.3  0.3 - 1.2 mg/dL   GFR calc non Af Amer >90  >90 mL/min   GFR calc Af Amer >90  >90 mL/min   Anion gap 16 (*) 5 - 15  COMPREHENSIVE METABOLIC PANEL     Status: Abnormal   Collection Time    11/19/13  5:16 PM      Result Value Ref Range   Sodium 137  137 - 147 mEq/L   Potassium 4.1  3.7 - 5.3 mEq/L   Chloride 103  96 - 112 mEq/L   CO2 18 (*) 19 - 32 mEq/L   Glucose, Bld 104 (*) 70 - 99 mg/dL   BUN 8  6 - 23 mg/dL   Creatinine, Ser 0.67  0.50 - 1.10 mg/dL   Calcium 8.3 (*) 8.4 - 10.5 mg/dL   Total Protein 5.5 (*) 6.0 - 8.3 g/dL   Albumin 2.2 (*) 3.5 - 5.2 g/dL   AST 19  0 - 37 U/L   ALT 9  0 - 35 U/L   Alkaline Phosphatase 88  39 - 117 U/L   Total Bilirubin 0.3  0.3 - 1.2 mg/dL   GFR calc non Af Amer >90  >90 mL/min   GFR calc Af Amer >90  >90  mL/min   Anion gap 16 (*) 5 - 15  CBC     Status: Abnormal   Collection Time    11/19/13  5:16 PM      Result Value Ref Range   WBC 17.3 (*) 4.0 - 10.5 K/uL   RBC 3.52 (*) 3.87 - 5.11 MIL/uL   Hemoglobin 10.6 (*) 12.0 - 15.0 g/dL   HCT 32.1 (*) 36.0 -  46.0 %   MCV 91.2  78.0 - 100.0 fL   MCH 30.1  26.0 - 34.0 pg   MCHC 33.0  30.0 - 36.0 g/dL   RDW 15.5  11.5 - 15.5 %   Platelets 198  150 - 400 K/uL  MRSA PCR SCREENING     Status: None   Collection Time    11/19/13  8:10 PM      Result Value Ref Range   MRSA by PCR NEGATIVE  NEGATIVE  CBC     Status: Abnormal   Collection Time    11/20/13  5:35 AM      Result Value Ref Range   WBC 16.6 (*) 4.0 - 10.5 K/uL   RBC 2.55 (*) 3.87 - 5.11 MIL/uL   Hemoglobin 7.9 (*) 12.0 - 15.0 g/dL   HCT 23.3 (*) 36.0 - 46.0 %   MCV 91.4  78.0 - 100.0 fL   MCH 31.0  26.0 - 34.0 pg   MCHC 33.9  30.0 - 36.0 g/dL   RDW 15.4  11.5 - 15.5 %   Platelets 188  150 - 400 K/uL  COMPREHENSIVE METABOLIC PANEL     Status: Abnormal   Collection Time    11/20/13  5:35 AM      Result Value Ref Range   Sodium 137  137 - 147 mEq/L   Potassium 3.8  3.7 - 5.3 mEq/L   Chloride 104  96 - 112 mEq/L   CO2 23  19 - 32 mEq/L   Glucose, Bld 134 (*) 70 - 99 mg/dL   BUN 6  6 - 23 mg/dL   Creatinine, Ser 0.80  0.50 - 1.10 mg/dL   Calcium 7.3 (*) 8.4 - 10.5 mg/dL   Total Protein 4.4 (*) 6.0 - 8.3 g/dL   Albumin 1.7 (*) 3.5 - 5.2 g/dL   AST 20  0 - 37 U/L   ALT 8  0 - 35 U/L   Alkaline Phosphatase 66  39 - 117 U/L   Total Bilirubin <0.2 (*) 0.3 - 1.2 mg/dL   GFR calc non Af Amer >90  >90 mL/min   GFR calc Af Amer >90  >90 mL/min   Anion gap 10  5 - 15  URIC ACID     Status: None   Collection Time    11/20/13  5:35 AM      Result Value Ref Range   Uric Acid, Serum 5.1  2.4 - 7.0 mg/dL   A/P: preeclampsia         Continue magnesium until 4 pm

## 2013-11-21 LAB — TYPE AND SCREEN
ABO/RH(D): A NEG
ANTIBODY SCREEN: POSITIVE
DAT, IgG: NEGATIVE
Unit division: 0

## 2013-11-21 MED ORDER — RHO D IMMUNE GLOBULIN 1500 UNIT/2ML IJ SOSY
300.0000 ug | PREFILLED_SYRINGE | Freq: Once | INTRAMUSCULAR | Status: AC
  Administered 2013-11-21: 300 ug via INTRAVENOUS
  Filled 2013-11-21: qty 2

## 2013-11-21 NOTE — Discharge Summary (Signed)
Obstetric Discharge Summary Reason for Admission: induction of labor Prenatal Procedures: Preeclampsia Intrapartum Procedures: spontaneous vaginal delivery Postpartum Procedures: none Complications-Operative and Postpartum: second degree perineal laceration Hemoglobin  Date Value Ref Range Status  11/20/2013 7.9* 12.0 - 15.0 g/dL Final     DELTA CHECK NOTED     REPEATED TO VERIFY     HCT  Date Value Ref Range Status  11/20/2013 23.3* 36.0 - 46.0 % Final    Physical Exam:  General: alert Lochia: appropriate Uterine Fundus: firm Incision: healing well DVT Evaluation: No evidence of DVT seen on physical exam.  Discharge Diagnoses: Term Pregnancy-delivered  Discharge Information: Date: 11/21/2013 Activity: pelvic rest Diet: routine Medications: PNV, Colace and Iron Condition: stable Instructions: refer to practice specific booklet Discharge to: home   Newborn Data: Live born female  Birth Weight: 8 lb (3629 g) APGAR: 9, 9  Home with mother.  Megan Powell 11/21/2013, 8:57 AM

## 2013-11-22 LAB — RH IG WORKUP (INCLUDES ABO/RH)
ABO/RH(D): A NEG
Fetal Screen: NEGATIVE
Gestational Age(Wks): 39
UNIT DIVISION: 0

## 2013-12-28 ENCOUNTER — Encounter (HOSPITAL_COMMUNITY): Payer: Self-pay | Admitting: *Deleted

## 2013-12-29 ENCOUNTER — Other Ambulatory Visit: Payer: Self-pay | Admitting: Obstetrics and Gynecology

## 2013-12-30 LAB — CYTOLOGY - PAP

## 2014-02-26 HISTORY — PX: WISDOM TOOTH EXTRACTION: SHX21

## 2014-12-06 ENCOUNTER — Encounter: Payer: Self-pay | Admitting: *Deleted

## 2014-12-06 ENCOUNTER — Encounter: Payer: Self-pay | Admitting: Family

## 2014-12-06 ENCOUNTER — Other Ambulatory Visit (HOSPITAL_COMMUNITY)
Admission: RE | Admit: 2014-12-06 | Discharge: 2014-12-06 | Disposition: A | Discharge status: Home / Self Care | Payer: Medicaid Other | Source: Ambulatory Visit | Attending: Hematology and Oncology | Admitting: Hematology and Oncology

## 2014-12-06 ENCOUNTER — Ambulatory Visit (INDEPENDENT_AMBULATORY_CARE_PROVIDER_SITE_OTHER): Payer: Self-pay | Admitting: Family

## 2014-12-06 VITALS — BP 125/80 | HR 88 | Wt 171.0 lb

## 2014-12-06 DIAGNOSIS — Z8759 Personal history of other complications of pregnancy, childbirth and the puerperium: Secondary | ICD-10-CM

## 2014-12-06 DIAGNOSIS — O26899 Other specified pregnancy related conditions, unspecified trimester: Secondary | ICD-10-CM | POA: Insufficient documentation

## 2014-12-06 DIAGNOSIS — Z113 Encounter for screening for infections with a predominantly sexual mode of transmission: Secondary | ICD-10-CM | POA: Insufficient documentation

## 2014-12-06 DIAGNOSIS — O360110 Maternal care for anti-D [Rh] antibodies, first trimester, not applicable or unspecified: Secondary | ICD-10-CM

## 2014-12-06 DIAGNOSIS — Z6791 Unspecified blood type, Rh negative: Secondary | ICD-10-CM

## 2014-12-06 DIAGNOSIS — Z3480 Encounter for supervision of other normal pregnancy, unspecified trimester: Secondary | ICD-10-CM | POA: Insufficient documentation

## 2014-12-06 DIAGNOSIS — Z3481 Encounter for supervision of other normal pregnancy, first trimester: Secondary | ICD-10-CM

## 2014-12-06 DIAGNOSIS — Z3491 Encounter for supervision of normal pregnancy, unspecified, first trimester: Secondary | ICD-10-CM

## 2014-12-06 HISTORY — DX: Personal history of other complications of pregnancy, childbirth and the puerperium: Z87.59

## 2014-12-06 NOTE — Progress Notes (Signed)
Pt is still breastfeeding.  Bedside U/S shows IUP with FHT of 153 BPM and CRL of 12.33mm  GA [redacted]w[redacted]d

## 2014-12-06 NOTE — Progress Notes (Signed)
   Subjective:    Megan Powell is a G2P1001 [redacted]w[redacted]d being seen today for her first obstetrical visit.  Her obstetrical history is significant for history of gestational hypertension with induction of labor at 39 wks. Patient does intend to breast feed.  Currently breastfeeding 24 year old.   Pregnancy history fully reviewed.  Patient reports no bleeding and no cramping.  Filed Vitals:   12/06/14 1011  BP: 125/80  Pulse: 88  Weight: 171 lb (77.565 kg)    HISTORY: OB History  Gravida Para Term Preterm AB SAB TAB Ectopic Multiple Living  2 1 1       1     # Outcome Date GA Lbr Len/2nd Weight Sex Delivery Anes PTL Lv  2 Current           1 Term 11/19/13 [redacted]w[redacted]d 07:56 / 00:59 8 lb (3.629 kg) M Vag-Spont EPI  Y     Past Medical History  Diagnosis Date  . Medical history non-contributory   . Hypertension affecting pregnancy    Past Surgical History  Procedure Laterality Date  . No past surgeries     Family History  Problem Relation Age of Onset  . Hypertension Father      Exam   BP 125/80 mmHg  Pulse 88  Wt 171 lb (77.565 kg)  LMP 10/09/2014 Uterine Size: size equals dates  Pelvic Exam:    Perineum: No Hemorrhoids, Normal Perineum   Vulva: normal   Vagina:  normal mucosa, normal discharge, no palpable nodules   pH: Not done   Cervix: no bleeding, no cervical motion tenderness and no lesions   Adnexa: normal adnexa and no mass, fullness, tenderness   Bony Pelvis: Adequate  System: Breast:  No nipple retraction or dimpling, No nipple discharge or bleeding, No axillary or supraclavicular adenopathy, Normal to palpation without dominant masses   Skin: normal coloration and turgor, no rashes    Neurologic: negative   Extremities: normal strength, tone, and muscle mass   HEENT neck supple with midline trachea and thyroid without masses   Mouth/Teeth mucous membranes moist, pharynx normal without lesions   Neck supple and no masses   Cardiovascular: regular rate and rhythm,  no murmurs or gallops   Respiratory:  appears well, vitals normal, no respiratory distress, acyanotic, normal RR, neck free of mass or lymphadenopathy, chest clear, no wheezing, crepitations, rhonchi, normal symmetric air entry   Abdomen: soft, non-tender; bowel sounds normal; no masses,  no organomegaly   Urinary: urethral meatus normal        Assessment:    Pregnancy: G2P1001 Patient Active Problem List   Diagnosis Date Noted  . History of gestational hypertension 12/06/2014  . Supervision of normal pregnancy 12/06/2014        Plan:   Initial labs drawn. Prenatal vitamins. Problem list reviewed and updated. Genetic Screening discussed First Screen: will order at next visit. Follow up in 4 weeks.  Kathrine Haddock N 12/06/2014

## 2014-12-06 NOTE — Patient Instructions (Signed)
First Trimester of Pregnancy The first trimester of pregnancy is from week 1 until the end of week 12 (months 1 through 3). A week after a sperm fertilizes an egg, the egg will implant on the wall of the uterus. This embryo will begin to develop into a baby. Genes from you and your partner are forming the baby. The female genes determine whether the baby is a boy or a girl. At 6-8 weeks, the eyes and face are formed, and the heartbeat can be seen on ultrasound. At the end of 12 weeks, all the baby's organs are formed.  Now that you are pregnant, you will want to do everything you can to have a healthy baby. Two of the most important things are to get good prenatal care and to follow your health care provider's instructions. Prenatal care is all the medical care you receive before the baby's birth. This care will help prevent, find, and treat any problems during the pregnancy and childbirth. BODY CHANGES Your body goes through many changes during pregnancy. The changes vary from woman to woman.   You may gain or lose a couple of pounds at first.  You may feel sick to your stomach (nauseous) and throw up (vomit). If the vomiting is uncontrollable, call your health care provider.  You may tire easily.  You may develop headaches that can be relieved by medicines approved by your health care provider.  You may urinate more often. Painful urination may mean you have a bladder infection.  You may develop heartburn as a result of your pregnancy.  You may develop constipation because certain hormones are causing the muscles that push waste through your intestines to slow down.  You may develop hemorrhoids or swollen, bulging veins (varicose veins).  Your breasts may begin to grow larger and become tender. Your nipples may stick out more, and the tissue that surrounds them (areola) may become darker.  Your gums may bleed and may be sensitive to brushing and flossing.  Dark spots or blotches (chloasma,  mask of pregnancy) may develop on your face. This will likely fade after the baby is born.  Your menstrual periods will stop.  You may have a loss of appetite.  You may develop cravings for certain kinds of food.  You may have changes in your emotions from day to day, such as being excited to be pregnant or being concerned that something may go wrong with the pregnancy and baby.  You may have more vivid and strange dreams.  You may have changes in your hair. These can include thickening of your hair, rapid growth, and changes in texture. Some women also have hair loss during or after pregnancy, or hair that feels dry or thin. Your hair will most likely return to normal after your baby is born. WHAT TO EXPECT AT YOUR PRENATAL VISITS During a routine prenatal visit:  You will be weighed to make sure you and the baby are growing normally.  Your blood pressure will be taken.  Your abdomen will be measured to track your baby's growth.  The fetal heartbeat will be listened to starting around week 10 or 12 of your pregnancy.  Test results from any previous visits will be discussed. Your health care provider may ask you:  How you are feeling.  If you are feeling the baby move.  If you have had any abnormal symptoms, such as leaking fluid, bleeding, severe headaches, or abdominal cramping.  If you are using any tobacco products,   including cigarettes, chewing tobacco, and electronic cigarettes.  If you have any questions. Other tests that may be performed during your first trimester include:  Blood tests to find your blood type and to check for the presence of any previous infections. They will also be used to check for low iron levels (anemia) and Rh antibodies. Later in the pregnancy, blood tests for diabetes will be done along with other tests if problems develop.  Urine tests to check for infections, diabetes, or protein in the urine.  An ultrasound to confirm the proper growth  and development of the baby.  An amniocentesis to check for possible genetic problems.  Fetal screens for spina bifida and Down syndrome.  You may need other tests to make sure you and the baby are doing well.  HIV (human immunodeficiency virus) testing. Routine prenatal testing includes screening for HIV, unless you choose not to have this test. HOME CARE INSTRUCTIONS  Medicines  Follow your health care provider's instructions regarding medicine use. Specific medicines may be either safe or unsafe to take during pregnancy.  Take your prenatal vitamins as directed.  If you develop constipation, try taking a stool softener if your health care provider approves. Diet  Eat regular, well-balanced meals. Choose a variety of foods, such as meat or vegetable-based protein, fish, milk and low-fat dairy products, vegetables, fruits, and whole grain breads and cereals. Your health care provider will help you determine the amount of weight gain that is right for you.  Avoid raw meat and uncooked cheese. These carry germs that can cause birth defects in the baby.  Eating four or five small meals rather than three large meals a day may help relieve nausea and vomiting. If you start to feel nauseous, eating a few soda crackers can be helpful. Drinking liquids between meals instead of during meals also seems to help nausea and vomiting.  If you develop constipation, eat more high-fiber foods, such as fresh vegetables or fruit and whole grains. Drink enough fluids to keep your urine clear or pale yellow. Activity and Exercise  Exercise only as directed by your health care provider. Exercising will help you:  Control your weight.  Stay in shape.  Be prepared for labor and delivery.  Experiencing pain or cramping in the lower abdomen or low back is a good sign that you should stop exercising. Check with your health care provider before continuing normal exercises.  Try to avoid standing for long  periods of time. Move your legs often if you must stand in one place for a long time.  Avoid heavy lifting.  Wear low-heeled shoes, and practice good posture.  You may continue to have sex unless your health care provider directs you otherwise. Relief of Pain or Discomfort  Wear a good support bra for breast tenderness.   Take warm sitz baths to soothe any pain or discomfort caused by hemorrhoids. Use hemorrhoid cream if your health care provider approves.   Rest with your legs elevated if you have leg cramps or low back pain.  If you develop varicose veins in your legs, wear support hose. Elevate your feet for 15 minutes, 3-4 times a day. Limit salt in your diet. Prenatal Care  Schedule your prenatal visits by the twelfth week of pregnancy. They are usually scheduled monthly at first, then more often in the last 2 months before delivery.  Write down your questions. Take them to your prenatal visits.  Keep all your prenatal visits as directed by your   health care provider. Safety  Wear your seat belt at all times when driving.  Make a list of emergency phone numbers, including numbers for family, friends, the hospital, and police and fire departments. General Tips  Ask your health care provider for a referral to a local prenatal education class. Begin classes no later than at the beginning of month 6 of your pregnancy.  Ask for help if you have counseling or nutritional needs during pregnancy. Your health care provider can offer advice or refer you to specialists for help with various needs.  Do not use hot tubs, steam rooms, or saunas.  Do not douche or use tampons or scented sanitary pads.  Do not cross your legs for long periods of time.  Avoid cat litter boxes and soil used by cats. These carry germs that can cause birth defects in the baby and possibly loss of the fetus by miscarriage or stillbirth.  Avoid all smoking, herbs, alcohol, and medicines not prescribed by  your health care provider. Chemicals in these affect the formation and growth of the baby.  Do not use any tobacco products, including cigarettes, chewing tobacco, and electronic cigarettes. If you need help quitting, ask your health care provider. You may receive counseling support and other resources to help you quit.  Schedule a dentist appointment. At home, brush your teeth with a soft toothbrush and be gentle when you floss. SEEK MEDICAL CARE IF:   You have dizziness.  You have mild pelvic cramps, pelvic pressure, or nagging pain in the abdominal area.  You have persistent nausea, vomiting, or diarrhea.  You have a bad smelling vaginal discharge.  You have pain with urination.  You notice increased swelling in your face, hands, legs, or ankles. SEEK IMMEDIATE MEDICAL CARE IF:   You have a fever.  You are leaking fluid from your vagina.  You have spotting or bleeding from your vagina.  You have severe abdominal cramping or pain.  You have rapid weight gain or loss.  You vomit blood or material that looks like coffee grounds.  You are exposed to German measles and have never had them.  You are exposed to fifth disease or chickenpox.  You develop a severe headache.  You have shortness of breath.  You have any kind of trauma, such as from a fall or a car accident.   This information is not intended to replace advice given to you by your health care provider. Make sure you discuss any questions you have with your health care provider.   Document Released: 02/06/2001 Document Revised: 03/05/2014 Document Reviewed: 12/23/2012 Elsevier Interactive Patient Education 2016 [employer]  

## 2014-12-07 LAB — OBSTETRIC PANEL
ANTIBODY SCREEN: NEGATIVE
Basophils Absolute: 0 10*3/uL (ref 0.0–0.1)
Basophils Relative: 0 % (ref 0–1)
EOS PCT: 1 % (ref 0–5)
Eosinophils Absolute: 0.1 10*3/uL (ref 0.0–0.7)
HCT: 43 % (ref 36.0–46.0)
Hemoglobin: 14.5 g/dL (ref 12.0–15.0)
Hepatitis B Surface Ag: NEGATIVE
Lymphocytes Relative: 29 % (ref 12–46)
Lymphs Abs: 2.1 10*3/uL (ref 0.7–4.0)
MCH: 30.3 pg (ref 26.0–34.0)
MCHC: 33.7 g/dL (ref 30.0–36.0)
MCV: 90 fL (ref 78.0–100.0)
MPV: 10.8 fL (ref 8.6–12.4)
Monocytes Absolute: 0.6 10*3/uL (ref 0.1–1.0)
Monocytes Relative: 8 % (ref 3–12)
NEUTROS ABS: 4.5 10*3/uL (ref 1.7–7.7)
Neutrophils Relative %: 62 % (ref 43–77)
PLATELETS: 262 10*3/uL (ref 150–400)
RBC: 4.78 MIL/uL (ref 3.87–5.11)
RDW: 15.4 % (ref 11.5–15.5)
Rh Type: NEGATIVE
Rubella: 1.65 Index — ABNORMAL HIGH (ref <0.90)
WBC: 7.3 10*3/uL (ref 4.0–10.5)

## 2014-12-07 LAB — URINE CYTOLOGY ANCILLARY ONLY
Chlamydia: NEGATIVE
Neisseria Gonorrhea: NEGATIVE

## 2014-12-07 LAB — HIV ANTIBODY (ROUTINE TESTING W REFLEX): HIV 1&2 Ab, 4th Generation: NONREACTIVE

## 2014-12-08 LAB — CULTURE, URINE COMPREHENSIVE
Colony Count: NO GROWTH
Organism ID, Bacteria: NO GROWTH

## 2014-12-14 ENCOUNTER — Encounter: Payer: Self-pay | Admitting: *Deleted

## 2015-01-03 ENCOUNTER — Ambulatory Visit (INDEPENDENT_AMBULATORY_CARE_PROVIDER_SITE_OTHER): Payer: Medicaid Other | Admitting: Family Medicine

## 2015-01-03 VITALS — BP 132/83 | HR 98 | Wt 170.0 lb

## 2015-01-03 DIAGNOSIS — Z3491 Encounter for supervision of normal pregnancy, unspecified, first trimester: Secondary | ICD-10-CM

## 2015-01-03 DIAGNOSIS — Z3481 Encounter for supervision of other normal pregnancy, first trimester: Secondary | ICD-10-CM

## 2015-01-03 NOTE — Progress Notes (Signed)
Subjective:  Deniesha Powell is a 24 y.o. G2P1001 at [redacted]w[redacted]d being seen today for ongoing prenatal care.  Patient reports no complaints.  Contractions: Not present.  Vag. Bleeding: None. Movement: Absent. Denies leaking of fluid.   The following portions of the patient's history were reviewed and updated as appropriate: allergies, current medications, past family history, past medical history, past social history, past surgical history and problem list. Problem list updated.  Objective:   Filed Vitals:   01/03/15 1458  BP: 132/83  Pulse: 98  Weight: 170 lb (77.111 kg)    Fetal Status: Fetal Heart Rate (bpm): 160   Movement: Absent     General:  Alert, oriented and cooperative. Patient is in no acute distress.  Skin: Skin is warm and dry. No rash noted.   Cardiovascular: Normal heart rate noted  Respiratory: Normal respiratory effort, no problems with respiration noted  Abdomen: Soft, gravid, appropriate for gestational age. Pain/Pressure: Absent     Pelvic: Vag. Bleeding: None Vag D/C Character: Thin   Cervical exam deferred        Extremities: Normal range of motion.  Edema: None  Mental Status: Normal mood and affect. Normal behavior. Normal judgment and thought content.   Urinalysis: Urine Protein: Negative Urine Glucose: Negative  Assessment and Plan:  Pregnancy: G2P1001 at [redacted]w[redacted]d  1. Supervision of normal pregnancy, first trimester Continue routine prenatal care. Panorama today - [place] MFM OB COMP + 14 WK; Future  Preterm labor symptoms and general obstetric precautions including but not limited to vaginal bleeding, contractions, leaking of fluid and fetal movement were reviewed in detail with the patient. Please refer to After Visit Summary for other counseling recommendations.  Return in about 8 weeks (around 02/28/2015) for ob visit.   Megan Jude, MD

## 2015-01-03 NOTE — Patient Instructions (Signed)
Second Trimester of Pregnancy The second trimester is from week 13 through week 28, months 4 through 6. The second trimester is often a time when you feel your best. Your body has also adjusted to being pregnant, and you begin to feel better physically. Usually, morning sickness has lessened or quit completely, you may have more energy, and you may have an increase in appetite. The second trimester is also a time when the fetus is growing rapidly. At the end of the sixth month, the fetus is about 9 inches long and weighs about 1 pounds. You will likely begin to feel the baby move (quickening) between 18 and 20 weeks of the pregnancy. BODY CHANGES Your body goes through many changes during pregnancy. The changes vary from woman to woman.   Your weight will continue to increase. You will notice your lower abdomen bulging out.  You may begin to get stretch marks on your hips, abdomen, and breasts.  You may develop headaches that can be relieved by medicines approved by your health care provider.  You may urinate more often because the fetus is pressing on your bladder.  You may develop or continue to have heartburn as a result of your pregnancy.  You may develop constipation because certain hormones are causing the muscles that push waste through your intestines to slow down.  You may develop hemorrhoids or swollen, bulging veins (varicose veins).  You may have back pain because of the weight gain and pregnancy hormones relaxing your joints between the bones in your pelvis and as a result of a shift in weight and the muscles that support your balance.  Your breasts will continue to grow and be tender.  Your gums may bleed and may be sensitive to brushing and flossing.  Dark spots or blotches (chloasma, mask of pregnancy) may develop on your face. This will likely fade after the baby is born.  A dark line from your belly button to the pubic area (linea nigra) may appear. This will likely  fade after the baby is born.  You may have changes in your hair. These can include thickening of your hair, rapid growth, and changes in texture. Some women also have hair loss during or after pregnancy, or hair that feels dry or thin. Your hair will most likely return to normal after your baby is born. WHAT TO EXPECT AT YOUR PRENATAL VISITS During a routine prenatal visit:  You will be weighed to make sure you and the fetus are growing normally.  Your blood pressure will be taken.  Your abdomen will be measured to track your baby's growth.  The fetal heartbeat will be listened to.  Any test results from the previous visit will be discussed. Your health care provider may ask you:  How you are feeling.  If you are feeling the baby move.  If you have had any abnormal symptoms, such as leaking fluid, bleeding, severe headaches, or abdominal cramping.  If you are using any tobacco products, including cigarettes, chewing tobacco, and electronic cigarettes.  If you have any questions. Other tests that may be performed during your second trimester include:  Blood tests that check for:  Low iron levels (anemia).  Gestational diabetes (between 24 and 28 weeks).  Rh antibodies.  Urine tests to check for infections, diabetes, or protein in the urine.  An ultrasound to confirm the proper growth and development of the baby.  An amniocentesis to check for possible genetic problems.  Fetal screens for spina bifida   and Down syndrome.  HIV (human immunodeficiency virus) testing. Routine prenatal testing includes screening for HIV, unless you choose not to have this test. HOME CARE INSTRUCTIONS   Avoid all smoking, herbs, alcohol, and unprescribed drugs. These chemicals affect the formation and growth of the baby.  Do not use any tobacco products, including cigarettes, chewing tobacco, and electronic cigarettes. If you need help quitting, ask your health care provider. You may receive  counseling support and other resources to help you quit.  Follow your health care provider's instructions regarding medicine use. There are medicines that are either safe or unsafe to take during pregnancy.  Exercise only as directed by your health care provider. Experiencing uterine cramps is a good sign to stop exercising.  Continue to eat regular, healthy meals.  Wear a good support bra for breast tenderness.  Do not use hot tubs, steam rooms, or saunas.  Wear your seat belt at all times when driving.  Avoid raw meat, uncooked cheese, cat litter boxes, and soil used by cats. These carry germs that can cause birth defects in the baby.  Take your prenatal vitamins.  Take 1500-2000 mg of calcium daily starting at the 20th week of pregnancy until you deliver your baby.  Try taking a stool softener (if your health care provider approves) if you develop constipation. Eat more high-fiber foods, such as fresh vegetables or fruit and whole grains. Drink plenty of fluids to keep your urine clear or pale yellow.  Take warm sitz baths to soothe any pain or discomfort caused by hemorrhoids. Use hemorrhoid cream if your health care provider approves.  If you develop varicose veins, wear support hose. Elevate your feet for 15 minutes, 3-4 times a day. Limit salt in your diet.  Avoid heavy lifting, wear low heel shoes, and practice good posture.  Rest with your legs elevated if you have leg cramps or low back pain.  Visit your dentist if you have not gone yet during your pregnancy. Use a soft toothbrush to brush your teeth and be gentle when you floss.  A sexual relationship may be continued unless your health care provider directs you otherwise.  Continue to go to all your prenatal visits as directed by your health care provider. SEEK MEDICAL CARE IF:   You have dizziness.  You have mild pelvic cramps, pelvic pressure, or nagging pain in the abdominal area.  You have persistent nausea,  vomiting, or diarrhea.  You have a bad smelling vaginal discharge.  You have pain with urination. SEEK IMMEDIATE MEDICAL CARE IF:   You have a fever.  You are leaking fluid from your vagina.  You have spotting or bleeding from your vagina.  You have severe abdominal cramping or pain.  You have rapid weight gain or loss.  You have shortness of breath with chest pain.  You notice sudden or extreme swelling of your face, hands, ankles, feet, or legs.  You have not felt your baby move in over an hour.  You have severe headaches that do not go away with medicine.  You have vision changes.   This information is not intended to replace advice given to you by your health care provider. Make sure you discuss any questions you have with your health care provider.   Document Released: 02/06/2001 Document Revised: 03/05/2014 Document Reviewed: 04/15/2012 Elsevier Interactive Patient Education [employer].  Breastfeeding Deciding to breastfeed is one of the best choices you can make for you and your baby. A change in  hormones during pregnancy causes your breast tissue to grow and increases the number and size of your milk ducts. These hormones also allow proteins, sugars, and fats from your blood supply to make breast milk in your milk-producing glands. Hormones prevent breast milk from being released before your baby is born as well as prompt milk flow after birth. Once breastfeeding has begun, thoughts of your baby, as well as his or her sucking or crying, can stimulate the release of milk from your milk-producing glands.  BENEFITS OF BREASTFEEDING For Your Baby  Your first milk (colostrum) helps your baby's digestive system function better.  There are antibodies in your milk that help your baby fight off infections.  Your baby has a lower incidence of asthma, allergies, and sudden infant death syndrome.  The nutrients in breast milk are better for your baby than infant  formulas and are designed uniquely for your baby's needs.  Breast milk improves your baby's brain development.  Your baby is less likely to develop other conditions, such as childhood obesity, asthma, or type 2 diabetes mellitus. For You  Breastfeeding helps to create a very special bond between you and your baby.  Breastfeeding is convenient. Breast milk is always available at the correct temperature and costs nothing.  Breastfeeding helps to burn calories and helps you lose the weight gained during pregnancy.  Breastfeeding makes your uterus contract to its prepregnancy size faster and slows bleeding (lochia) after you give birth.   Breastfeeding helps to lower your risk of developing type 2 diabetes mellitus, osteoporosis, and breast or ovarian cancer later in life. SIGNS THAT YOUR BABY IS HUNGRY Early Signs of Hunger  Increased alertness or activity.  Stretching.  Movement of the head from side to side.  Movement of the head and opening of the mouth when the corner of the mouth or cheek is stroked (rooting).  Increased sucking sounds, smacking lips, cooing, sighing, or squeaking.  Hand-to-mouth movements.  Increased sucking of fingers or hands. Late Signs of Hunger  Fussing.  Intermittent crying. Extreme Signs of Hunger Signs of extreme hunger will require calming and consoling before your baby will be able to breastfeed successfully. Do not wait for the following signs of extreme hunger to occur before you initiate breastfeeding:  Restlessness.  A loud, strong cry.  Screaming. BREASTFEEDING BASICS Breastfeeding Initiation  Find a comfortable place to sit or lie down, with your neck and back well supported.  Place a pillow or rolled up blanket under your baby to bring him or her to the level of your breast (if you are seated). Nursing pillows are specially designed to help support your arms and your baby while you breastfeed.  Make sure that your baby's  abdomen is facing your abdomen.  Gently massage your breast. With your fingertips, massage from your chest wall toward your nipple in a circular motion. This encourages milk flow. You may need to continue this action during the feeding if your milk flows slowly.  Support your breast with 4 fingers underneath and your thumb above your nipple. Make sure your fingers are well away from your nipple and your baby's mouth.  Stroke your baby's lips gently with your finger or nipple.  When your baby's mouth is open wide enough, quickly bring your baby to your breast, placing your entire nipple and as much of the colored area around your nipple (areola) as possible into your baby's mouth.  More areola should be visible above your baby's upper lip than below  the lower lip.  Your baby's tongue should be between his or her lower gum and your breast.  Ensure that your baby's mouth is correctly positioned around your nipple (latched). Your baby's lips should create a seal on your breast and be turned out (everted).  It is common for your baby to suck about 2-3 minutes in order to start the flow of breast milk. Latching Teaching your baby how to latch on to your breast properly is very important. An improper latch can cause nipple pain and decreased milk supply for you and poor weight gain in your baby. Also, if your baby is not latched onto your nipple properly, he or she may swallow some air during feeding. This can make your baby fussy. Burping your baby when you switch breasts during the feeding can help to get rid of the air. However, teaching your baby to latch on properly is still the best way to prevent fussiness from swallowing air while breastfeeding. Signs that your baby has successfully latched on to your nipple:  Silent tugging or silent sucking, without causing you pain.  Swallowing heard between every 3-4 sucks.  Muscle movement above and in front of his or her ears while sucking. Signs  that your baby has not successfully latched on to nipple:  Sucking sounds or smacking sounds from your baby while breastfeeding.  Nipple pain. If you think your baby has not latched on correctly, slip your finger into the corner of your baby's mouth to break the suction and place it between your baby's gums. Attempt breastfeeding initiation again. Signs of Successful Breastfeeding Signs from your baby:  A gradual decrease in the number of sucks or complete cessation of sucking.  Falling asleep.  Relaxation of his or her body.  Retention of a small amount of milk in his or her mouth.  Letting go of your breast by himself or herself. Signs from you:  Breasts that have increased in firmness, weight, and size 1-3 hours after feeding.  Breasts that are softer immediately after breastfeeding.  Increased milk volume, as well as a change in milk consistency and color by the fifth day of breastfeeding.  Nipples that are not sore, cracked, or bleeding. Signs That Your [employer] is Getting Enough Milk  Wetting at least 3 diapers in a 24-hour period. The urine should be clear and pale yellow by age 81 days.  At least 3 stools in a 24-hour period by age 81 days. The stool should be soft and yellow.  At least 3 stools in a 24-hour period by age 75 days. The stool should be seedy and yellow.  No loss of weight greater than 10% of birth weight during the first 19 days of age.  Average weight gain of 4-7 ounces (113-198 g) per week after age 2 days.  Consistent daily weight gain by age 21 days, without weight loss after the age of 2 weeks. After a feeding, your baby may spit up a small amount. This is common. BREASTFEEDING FREQUENCY AND DURATION Frequent feeding will help you make more milk and can prevent sore nipples and breast engorgement. Breastfeed when you feel the need to reduce the fullness of your breasts or when your baby shows signs of hunger. This is called "breastfeeding on demand." Avoid  introducing a pacifier to your baby while you are working to establish breastfeeding (the first 4-6 weeks after your baby is born). After this time you may choose to use a pacifier. Research has shown that pacifier  use during the first year of a baby's life decreases the risk of sudden infant death syndrome (SIDS). Allow your baby to feed on each breast as long as he or she wants. Breastfeed until your baby is finished feeding. When your baby unlatches or falls asleep while feeding from the first breast, offer the second breast. Because newborns are often sleepy in the first few weeks of life, you may need to awaken your baby to get him or her to feed. Breastfeeding times will vary from baby to baby. However, the following rules can serve as a guide to help you ensure that your baby is properly fed:  Newborns (babies 23 weeks of age or younger) may breastfeed every 1-3 hours.  Newborns should not go longer than 3 hours during the day or 5 hours during the night without breastfeeding.  You should breastfeed your baby a minimum of 8 times in a 24-hour period until you begin to introduce solid foods to your baby at around 62 months of age. BREAST MILK PUMPING Pumping and storing breast milk allows you to ensure that your baby is exclusively fed your breast milk, even at times when you are unable to breastfeed. This is especially important if you are going back to work while you are still breastfeeding or when you are not able to be present during feedings. Your lactation consultant can give you guidelines on how long it is safe to store breast milk. A breast pump is a machine that allows you to pump milk from your breast into a sterile bottle. The pumped breast milk can then be stored in a refrigerator or freezer. Some breast pumps are operated by hand, while others use electricity. Ask your lactation consultant which type will work best for you. Breast pumps can be purchased, but some hospitals and  breastfeeding support groups lease breast pumps on a monthly basis. A lactation consultant can teach you how to hand express breast milk, if you prefer not to use a pump. CARING FOR YOUR BREASTS WHILE YOU BREASTFEED Nipples can become dry, cracked, and sore while breastfeeding. The following recommendations can help keep your breasts moisturized and healthy:  Avoid using soap on your nipples.  Wear a supportive bra. Although not required, special nursing bras and tank tops are designed to allow access to your breasts for breastfeeding without taking off your entire bra or top. Avoid wearing underwire-style bras or extremely tight bras.  Air dry your nipples for 3-58minutes after each feeding.  Use only cotton bra pads to absorb leaked breast milk. Leaking of breast milk between feedings is normal.  Use lanolin on your nipples after breastfeeding. Lanolin helps to maintain your skin's normal moisture barrier. If you use pure lanolin, you do not need to wash it off before feeding your baby again. Pure lanolin is not toxic to your baby. You may also hand express a few drops of breast milk and gently massage that milk into your nipples and allow the milk to air dry. In the first few weeks after giving birth, some women experience extremely full breasts (engorgement). Engorgement can make your breasts feel heavy, warm, and tender to the touch. Engorgement peaks within 3-5 days after you give birth. The following recommendations can help ease engorgement:  Completely empty your breasts while breastfeeding or pumping. You may want to start by applying warm, moist heat (in the shower or with warm water-soaked hand towels) just before feeding or pumping. This increases circulation and helps the milk flow.  If your baby does not completely empty your breasts while breastfeeding, pump any extra milk after he or she is finished.  Wear a snug bra (nursing or regular) or tank top for 1-2 days to signal your body  to slightly decrease milk production.  Apply ice packs to your breasts, unless this is too uncomfortable for you.  Make sure that your baby is latched on and positioned properly while breastfeeding. If engorgement persists after 48 hours of following these recommendations, contact your health care provider or a Science writer. OVERALL HEALTH CARE RECOMMENDATIONS WHILE BREASTFEEDING  Eat healthy foods. Alternate between meals and snacks, eating 3 of each per day. Because what you eat affects your breast milk, some of the foods may make your baby more irritable than usual. Avoid eating these foods if you are sure that they are negatively affecting your baby.  Drink milk, fruit juice, and water to satisfy your thirst (about 10 glasses a day).  Rest often, relax, and continue to take your prenatal vitamins to prevent fatigue, stress, and anemia.  Continue breast self-awareness checks.  Avoid chewing and smoking tobacco. Chemicals from cigarettes that pass into breast milk and exposure to secondhand smoke may harm your baby.  Avoid alcohol and drug use, including marijuana. Some medicines that may be harmful to your baby can pass through breast milk. It is important to ask your health care provider before taking any medicine, including all over-the-counter and prescription medicine as well as vitamin and herbal supplements. It is possible to become pregnant while breastfeeding. If birth control is desired, ask your health care provider about options that will be safe for your baby. SEEK MEDICAL CARE IF:  You feel like you want to stop breastfeeding or have become frustrated with breastfeeding.  You have painful breasts or nipples.  Your nipples are cracked or bleeding.  Your breasts are red, tender, or warm.  You have a swollen area on either breast.  You have a fever or chills.  You have nausea or vomiting.  You have drainage other than breast milk from your nipples.  Your  breasts do not become full before feedings by the fifth day after you give birth.  You feel sad and depressed.  Your baby is too sleepy to eat well.  Your baby is having trouble sleeping.   Your baby is wetting less than 3 diapers in a 24-hour period.  Your baby has less than 3 stools in a 24-hour period.  Your baby's skin or the white part of his or her eyes becomes yellow.   Your baby is not gaining weight by 50 days of age. SEEK IMMEDIATE MEDICAL CARE IF:  Your baby is overly tired (lethargic) and does not want to wake up and feed.  Your baby develops an unexplained fever.   This information is not intended to replace advice given to you by your health care provider. Make sure you discuss any questions you have with your health care provider.   Document Released: 02/12/2005 Document Revised: 11/03/2014 Document Reviewed: 08/06/2012 Elsevier Interactive Patient Education [employer].

## 2015-01-03 NOTE — Assessment & Plan Note (Signed)
BABYSCRIPTS PATIENT: [X ] initial, [X ] 12, [ ] 20, [ ] 28, [ ] 32, [ ] 36, [ ] 38, [ ] 39, [ ] 40 

## 2015-01-17 ENCOUNTER — Telehealth: Payer: Self-pay | Admitting: *Deleted

## 2015-01-17 ENCOUNTER — Encounter: Payer: Self-pay | Admitting: *Deleted

## 2015-01-17 DIAGNOSIS — Z3490 Encounter for supervision of normal pregnancy, unspecified, unspecified trimester: Secondary | ICD-10-CM

## 2015-01-17 NOTE — Telephone Encounter (Signed)
t notified of Panorama being low risk and It's a BOY!

## 2015-02-23 ENCOUNTER — Ambulatory Visit (HOSPITAL_COMMUNITY)
Admission: RE | Admit: 2015-02-23 | Discharge: 2015-02-23 | Disposition: A | Discharge status: Home / Self Care | Payer: Medicaid Other | Source: Ambulatory Visit | Attending: Family Medicine | Admitting: Family Medicine

## 2015-02-23 ENCOUNTER — Other Ambulatory Visit: Payer: Self-pay | Admitting: Family Medicine

## 2015-02-23 DIAGNOSIS — Z36 Encounter for antenatal screening of mother: Secondary | ICD-10-CM | POA: Diagnosis not present

## 2015-02-23 DIAGNOSIS — Z3A18 18 weeks gestation of pregnancy: Secondary | ICD-10-CM

## 2015-02-23 DIAGNOSIS — O09292 Supervision of pregnancy with other poor reproductive or obstetric history, second trimester: Secondary | ICD-10-CM | POA: Diagnosis present

## 2015-02-23 DIAGNOSIS — O09899 Supervision of other high risk pregnancies, unspecified trimester: Secondary | ICD-10-CM

## 2015-02-23 DIAGNOSIS — Z3689 Encounter for other specified antenatal screening: Secondary | ICD-10-CM

## 2015-02-23 DIAGNOSIS — Z3491 Encounter for supervision of normal pregnancy, unspecified, first trimester: Secondary | ICD-10-CM

## 2015-02-27 NOTE — L&D Delivery Note (Signed)
Delivery Note At 6:08 PM a viable female was delivered via Vaginal, Spontaneous Delivery (Presentation: Right Occiput Anterior).  APGAR: 7, 9; weight pending.   Placenta status: Intact, Spontaneous. Cord: 3 vessels with the following complications: Nuchal x1, tight, delivered through via somersault. None.  Cord pH: n/a  Anesthesia: Epidural  Episiotomy: None Lacerations: Labial, Rt-small, hemostatic Est. Blood Loss (mL): 25  Mom to postpartum.  Baby to Couplet care / Skin to Skin.  Julianne Handler, CNM 07/22/2015, 6:34 PM

## 2015-02-28 ENCOUNTER — Encounter: Payer: Medicaid Other | Admitting: Obstetrics & Gynecology

## 2015-02-28 ENCOUNTER — Ambulatory Visit (INDEPENDENT_AMBULATORY_CARE_PROVIDER_SITE_OTHER): Payer: Medicaid Other | Admitting: Family

## 2015-02-28 VITALS — BP 127/89 | HR 86 | Wt 179.0 lb

## 2015-02-28 DIAGNOSIS — Z3492 Encounter for supervision of normal pregnancy, unspecified, second trimester: Secondary | ICD-10-CM

## 2015-02-28 DIAGNOSIS — Z8759 Personal history of other complications of pregnancy, childbirth and the puerperium: Secondary | ICD-10-CM

## 2015-02-28 NOTE — Progress Notes (Signed)
Subjective:  Megan Powell is a 25 y.o. G2P1001 at [redacted]w[redacted]d being seen today for ongoing prenatal care.  She is currently monitored for the following issues for this low-risk pregnancy and has history of gestational hypertension; Supervision of normal pregnancy; and Rh negative status during pregnancy on her problem list.  Patient reports no complaints.  Contractions: Not present. Vag. Bleeding: None.  Movement: Present. Denies leaking of fluid.   The following portions of the patient's history were reviewed and updated as appropriate: allergies, current medications, past family history, past medical history, past social history, past surgical history and problem list. Problem list updated.  Objective:   Filed Vitals:   02/28/15 1036  BP: 127/89  Pulse: 86  Weight: 179 lb (81.194 kg)    Fetal Status: Fetal Heart Rate (bpm): 144 Fundal Height: 21 cm Movement: Present     General:  Alert, oriented and cooperative. Patient is in no acute distress.  Skin: Skin is warm and dry. No rash noted.   Cardiovascular: Normal heart rate noted  Respiratory: Normal respiratory effort, no problems with respiration noted  Abdomen: Soft, gravid, appropriate for gestational age. Pain/Pressure: Absent     Pelvic: Vag. Bleeding: None Vag D/C Character: Thin   Cervical exam deferred        Extremities: Normal range of motion.  Edema: None  Mental Status: Normal mood and affect. Normal behavior. Normal judgment and thought content.   Urinalysis: Urine Protein: Negative Urine Glucose: Negative  Assessment and Plan:  Pregnancy: G2P1001 at [redacted]w[redacted]d  1. Supervision of normal pregnancy, second trimester - Reviewed anatomy ultrasound   2. History of gestational hypertension - Close observation of blood pressures  Preterm labor symptoms and general obstetric precautions including but not limited to vaginal bleeding, contractions, leaking of fluid and fetal movement were reviewed in detail with the patient. Please  refer to After Visit Summary for other counseling recommendations.   Babyscripts schedule - weekly blood pressures   Gwen Pounds, CNM

## 2015-04-29 ENCOUNTER — Ambulatory Visit (INDEPENDENT_AMBULATORY_CARE_PROVIDER_SITE_OTHER): Payer: Medicaid Other | Admitting: Advanced Practice Midwife

## 2015-04-29 VITALS — BP 125/86 | HR 102 | Wt 195.0 lb

## 2015-04-29 DIAGNOSIS — O360930 Maternal care for other rhesus isoimmunization, third trimester, not applicable or unspecified: Secondary | ICD-10-CM

## 2015-04-29 DIAGNOSIS — Z23 Encounter for immunization: Secondary | ICD-10-CM | POA: Diagnosis not present

## 2015-04-29 DIAGNOSIS — Z36 Encounter for antenatal screening of mother: Secondary | ICD-10-CM | POA: Diagnosis not present

## 2015-04-29 DIAGNOSIS — Z3492 Encounter for supervision of normal pregnancy, unspecified, second trimester: Secondary | ICD-10-CM

## 2015-04-29 DIAGNOSIS — Z3483 Encounter for supervision of other normal pregnancy, third trimester: Secondary | ICD-10-CM

## 2015-04-29 LAB — CBC
HEMATOCRIT: 38 % (ref 36.0–46.0)
HEMOGLOBIN: 12.5 g/dL (ref 12.0–15.0)
MCH: 29.9 pg (ref 26.0–34.0)
MCHC: 32.9 g/dL (ref 30.0–36.0)
MCV: 90.9 fL (ref 78.0–100.0)
MPV: 10.7 fL (ref 8.6–12.4)
Platelets: 259 10*3/uL (ref 150–400)
RBC: 4.18 MIL/uL (ref 3.87–5.11)
RDW: 14.3 % (ref 11.5–15.5)
WBC: 10.3 10*3/uL (ref 4.0–10.5)

## 2015-04-29 MED ORDER — RHO D IMMUNE GLOBULIN 1500 UNIT/2ML IJ SOSY
300.0000 ug | PREFILLED_SYRINGE | Freq: Once | INTRAMUSCULAR | Status: AC
  Administered 2015-04-29: 300 ug via INTRAMUSCULAR

## 2015-04-30 LAB — HIV ANTIBODY (ROUTINE TESTING W REFLEX): HIV 1&2 Ab, 4th Generation: NONREACTIVE

## 2015-04-30 LAB — RPR TITER

## 2015-04-30 LAB — ANTIBODY SCREEN: Antibody Screen: NEGATIVE

## 2015-04-30 LAB — GLUCOSE TOLERANCE, 1 HOUR (50G) W/O FASTING: GLUCOSE, 1 HR, GESTATIONAL: 85 mg/dL (ref <140)

## 2015-04-30 LAB — RPR: RPR: REACTIVE — AB

## 2015-05-01 NOTE — Progress Notes (Signed)
Subjective:  Megan Powell is a 25 y.o. G2P1001 at [redacted]w[redacted]d being seen today for ongoing prenatal care.  She is currently monitored for the following issues for this low-risk pregnancy and has History of gestational hypertension; Supervision of normal pregnancy; and Rh negative status during pregnancy on her problem list.  Patient reports no complaints.  Contractions: Not present. Vag. Bleeding: None.  Movement: Present. Denies leaking of fluid.   The following portions of the patient's history were reviewed and updated as appropriate: allergies, current medications, past family history, past medical history, past social history, past surgical history and problem list. Problem list updated.  Objective:   Filed Vitals:   04/29/15 0957  BP: 125/86  Pulse: 102  Weight: 195 lb (88.451 kg)    Fetal Status:     Movement: Present     General:  Alert, oriented and cooperative. Patient is in no acute distress.  Skin: Skin is warm and dry. No rash noted.   Cardiovascular: Normal heart rate noted  Respiratory: Normal respiratory effort, no problems with respiration noted  Abdomen: Soft, gravid, appropriate for gestational age. Pain/Pressure: Absent     Pelvic: Vag. Bleeding: None Vag D/C Character: Thin   Cervical exam deferred        Extremities: Normal range of motion.  Edema: None  Mental Status: Normal mood and affect. Normal behavior. Normal judgment and thought content.   Urinalysis:      Assessment and Plan:  Pregnancy: G2P1001 at [redacted]w[redacted]d  1. Supervision of normal pregnancy, second trimester  - Antibody screen - CBC - HIV antibody - RPR - Glucose Tolerance, 1 HR (50g) - Tdap vaccine greater than or equal to 7yo IM - rho (d) immune globulin (RHIG/RHOPHYLAC) injection 300 mcg; Inject 2 mLs (300 mcg total) into the muscle once.  Preterm labor symptoms and general obstetric precautions including but not limited to vaginal bleeding, contractions, leaking of fluid and fetal movement were  reviewed in detail with the patient. Please refer to After Visit Summary for other counseling recommendations.  No Follow-up on file.   Elvera Maria, CNM

## 2015-05-02 LAB — FLUORESCENT TREPONEMAL AB(FTA)-IGG-BLD: Fluorescent Treponemal ABS: NONREACTIVE

## 2015-05-10 ENCOUNTER — Encounter: Payer: Self-pay | Admitting: Advanced Practice Midwife

## 2015-05-10 DIAGNOSIS — R768 Other specified abnormal immunological findings in serum: Secondary | ICD-10-CM | POA: Insufficient documentation

## 2015-05-27 ENCOUNTER — Ambulatory Visit (INDEPENDENT_AMBULATORY_CARE_PROVIDER_SITE_OTHER): Payer: Medicaid Other | Admitting: Family

## 2015-05-27 VITALS — BP 135/85 | HR 106 | Wt 203.0 lb

## 2015-05-27 DIAGNOSIS — Z3483 Encounter for supervision of other normal pregnancy, third trimester: Secondary | ICD-10-CM

## 2015-05-27 DIAGNOSIS — O360130 Maternal care for anti-D [Rh] antibodies, third trimester, not applicable or unspecified: Secondary | ICD-10-CM

## 2015-05-27 DIAGNOSIS — Z3492 Encounter for supervision of normal pregnancy, unspecified, second trimester: Secondary | ICD-10-CM

## 2015-05-27 NOTE — Progress Notes (Signed)
Subjective:  Megan Powell is a 25 y.o. G2P1001 at [redacted]w[redacted]d being seen today for ongoing prenatal care.  She is currently monitored for the following issues for this low-risk pregnancy and has History of gestational hypertension; Supervision of normal pregnancy; Rh negative status during pregnancy; and Biological false positive RPR test on her problem list.  Patient reports no complaints.  Contractions: Not present. Vag. Bleeding: None.  Movement: Present. Denies leaking of fluid.   The following portions of the patient's history were reviewed and updated as appropriate: allergies, current medications, past family history, past medical history, past social history, past surgical history and problem list. Problem list updated.  Objective:   Filed Vitals:   05/27/15 0956  BP: 135/85  Pulse: 106  Weight: 203 lb (92.08 kg)    Fetal Status: Fetal Heart Rate (bpm): 147 Fundal Height: 33 cm Movement: Present     General:  Alert, oriented and cooperative. Patient is in no acute distress.  Skin: Skin is warm and dry. No rash noted.   Cardiovascular: Normal heart rate noted  Respiratory: Normal respiratory effort, no problems with respiration noted  Abdomen: Soft, gravid, appropriate for gestational age. Pain/Pressure: Absent     Pelvic: Vag. Bleeding: None Vag D/C Character: Thin   Cervical exam deferred        Extremities: Normal range of motion.  Edema: None  Mental Status: Normal mood and affect. Normal behavior. Normal judgment and thought content.   Urinalysis: Urine Protein: Negative Urine Glucose: Trace  Assessment and Plan:  Pregnancy: G2P1001 at [redacted]w[redacted]d  1. Supervision of normal pregnancy, second trimester - Reviewed third trimester labs with patient.     2. Rh negative status during pregnancy, third trimester, not applicable or unspecified fetus - Received rhogam on 04/29/15  Preterm labor symptoms and general obstetric precautions including but not limited to vaginal bleeding,  contractions, leaking of fluid and fetal movement were reviewed in detail with the patient. Please refer to After Visit Summary for other counseling recommendations.  Return for [facility].   Venia Carbon Michiel Cowboy, CNM

## 2015-06-24 ENCOUNTER — Other Ambulatory Visit (HOSPITAL_COMMUNITY)
Admission: RE | Admit: 2015-06-24 | Discharge: 2015-06-24 | Disposition: A | Discharge status: Home / Self Care | Payer: Medicaid Other | Source: Ambulatory Visit | Attending: Advanced Practice Midwife | Admitting: Advanced Practice Midwife

## 2015-06-24 ENCOUNTER — Ambulatory Visit (INDEPENDENT_AMBULATORY_CARE_PROVIDER_SITE_OTHER): Payer: Medicaid Other | Admitting: Advanced Practice Midwife

## 2015-06-24 VITALS — BP 138/88 | HR 106 | Wt 211.0 lb

## 2015-06-24 DIAGNOSIS — Z3483 Encounter for supervision of other normal pregnancy, third trimester: Secondary | ICD-10-CM

## 2015-06-24 DIAGNOSIS — Z36 Encounter for antenatal screening of mother: Secondary | ICD-10-CM | POA: Diagnosis not present

## 2015-06-24 DIAGNOSIS — Z113 Encounter for screening for infections with a predominantly sexual mode of transmission: Secondary | ICD-10-CM | POA: Diagnosis not present

## 2015-06-24 DIAGNOSIS — Z3493 Encounter for supervision of normal pregnancy, unspecified, third trimester: Secondary | ICD-10-CM

## 2015-06-24 LAB — OB RESULTS CONSOLE GC/CHLAMYDIA: Gonorrhea: NEGATIVE

## 2015-06-24 LAB — OB RESULTS CONSOLE GBS: STREP GROUP B AG: NEGATIVE

## 2015-06-24 NOTE — Patient Instructions (Signed)
Braxton Hicks Contractions °Contractions of the uterus can occur throughout pregnancy. Contractions are not always a sign that you are in labor.  °WHAT ARE BRAXTON HICKS CONTRACTIONS?  °Contractions that occur before labor are called Braxton Hicks contractions, or false labor. Toward the end of pregnancy (32-34 weeks), these contractions can develop more often and may become more forceful. This is not true labor because these contractions do not result in opening (dilatation) and thinning of the cervix. They are sometimes difficult to tell apart from true labor because these contractions can be forceful and people have different pain tolerances. You should not feel embarrassed if you go to the hospital with false labor. Sometimes, the only way to tell if you are in true labor is for your health care provider to look for changes in the cervix. °If there are no prenatal problems or other health problems associated with the pregnancy, it is completely safe to be sent home with false labor and await the onset of true labor. °HOW CAN YOU TELL THE DIFFERENCE BETWEEN TRUE AND FALSE LABOR? °False Labor °· The contractions of false labor are usually shorter and not as hard as those of true labor.   °· The contractions are usually irregular.   °· The contractions are often felt in the front of the lower abdomen and in the groin.   °· The contractions may go away when you walk around or change positions while lying down.   °· The contractions get weaker and are shorter lasting as time goes on.   °· The contractions do not usually become progressively stronger, regular, and closer together as with true labor.   °True Labor °· Contractions in true labor last 30-70 seconds, become very regular, usually become more intense, and increase in frequency.   °· The contractions do not go away with walking.   °· The discomfort is usually felt in the top of the uterus and spreads to the lower abdomen and low back.   °· True labor can be  determined by your health care provider with an exam. This will show that the cervix is dilating and getting thinner.   °WHAT TO REMEMBER °· Keep up with your usual exercises and follow other instructions given by your health care provider.   °· Take medicines as directed by your health care provider.   °· Keep your regular prenatal appointments.   °· Eat and drink lightly if you think you are going into labor.   °· If Braxton Hicks contractions are making you uncomfortable:   °¨ Change your position from lying down or resting to walking, or from walking to resting.   °¨ Sit and rest in a tub of warm water.   °¨ Drink 2-3 glasses of water. Dehydration may cause these contractions.   °¨ Do slow and deep breathing several times an hour.   °WHEN SHOULD I SEEK IMMEDIATE MEDICAL CARE? °Seek immediate medical care if: °· Your contractions become stronger, more regular, and closer together.   °· You have fluid leaking or gushing from your vagina.   °· You have a fever.   °· You pass blood-tinged mucus.   °· You have vaginal bleeding.   °· You have continuous abdominal pain.   °· You have low back pain that you never had before.   °· You feel your baby's head pushing down and causing pelvic pressure.   °· Your baby is not moving as much as it used to.   °  °This information is not intended to replace advice given to you by your health care provider. Make sure you discuss any questions you have with your health care   provider. °  °Document Released: 02/12/2005 Document Revised: 02/17/2013 Document Reviewed: 11/24/2012 °Elsevier Interactive Patient Education ©2016 [employer] ° °

## 2015-06-24 NOTE — Progress Notes (Signed)
Subjective:  Megan Powell is a 25 y.o. G2P1001 at [redacted]w[redacted]d being seen today for ongoing prenatal care.  She is currently monitored for the following issues for this low-risk pregnancy and has History of gestational hypertension; Supervision of normal pregnancy; Rh negative status during pregnancy; and Biological false positive RPR test on her problem list.  Patient reports occasional contractions.  Contractions: Not present. Vag. Bleeding: None.  Movement: Present. Denies leaking of fluid.   The following portions of the patient's history were reviewed and updated as appropriate: allergies, current medications, past family history, past medical history, past social history, past surgical history and problem list. Problem list updated.  Objective:   Filed Vitals:   06/24/15 1030  BP: 138/88  Pulse: 106  Weight: 211 lb (95.709 kg)    Fetal Status: Fetal Heart Rate (bpm): 163 Fundal Height: 38 cm Movement: Present  Presentation: Vertex  General:  Alert, oriented and cooperative. Patient is in no acute distress.  Skin: Skin is warm and dry. No rash noted.   Cardiovascular: Normal heart rate noted  Respiratory: Normal respiratory effort, no problems with respiration noted  Abdomen: Soft, gravid, appropriate for gestational age. Pain/Pressure: Present     Pelvic: Vag. Bleeding: None Vag D/C Character: Thin   Cervical exam performed Dilation: Fingertip Effacement (%): 0 Station: Ballotable  Extremities: Normal range of motion.  Edema: None  Mental Status: Normal mood and affect. Normal behavior. Normal judgment and thought content.   Urinalysis: Urine Protein: Negative Urine Glucose: Trace  Assessment and Plan:  Pregnancy: G2P1001 at [redacted]w[redacted]d  1. Prenatal care in third trimester  - Culture, beta strep (group b only) - GC/Chlamydia probe amp ([facility])not at [facility]  Term labor symptoms and general obstetric precautions including but not limited to vaginal bleeding, contractions, leaking of  fluid and fetal movement were reviewed in detail with the patient. Please refer to After Visit Summary for other counseling recommendations.  Return in about 1 week (around 07/01/2015).  Watch BP closely.    Manya Silvas, CNM

## 2015-06-26 LAB — CULTURE, BETA STREP (GROUP B ONLY)

## 2015-06-27 LAB — GC/CHLAMYDIA PROBE AMP (~~LOC~~) NOT AT ARMC
Chlamydia: NEGATIVE
NEISSERIA GONORRHEA: NEGATIVE

## 2015-06-30 ENCOUNTER — Telehealth: Payer: Self-pay

## 2015-06-30 NOTE — Telephone Encounter (Signed)
[facility] called with an alert of patients blood pressure of 118/90.    Called patient and she states that she has not done a repeat blood pressure yet. Instructed patient to wait 15-20 mins and try and relax and be sitting when she takes next blood pressure. Patient reports good fetal movement and has an OB appointment tomorrow. Patient instructed to call back if her blood pressure remains elevated and/or she develops any dizziness, headaches that dont go away or seeing spots. Patient states understanding. [name] RN BSN

## 2015-07-01 ENCOUNTER — Ambulatory Visit (INDEPENDENT_AMBULATORY_CARE_PROVIDER_SITE_OTHER): Payer: Medicaid Other | Admitting: Family

## 2015-07-01 VITALS — BP 132/88 | HR 109 | Wt 214.0 lb

## 2015-07-01 DIAGNOSIS — O133 Gestational [pregnancy-induced] hypertension without significant proteinuria, third trimester: Secondary | ICD-10-CM | POA: Diagnosis not present

## 2015-07-01 DIAGNOSIS — Z3493 Encounter for supervision of normal pregnancy, unspecified, third trimester: Secondary | ICD-10-CM

## 2015-07-01 DIAGNOSIS — Z8759 Personal history of other complications of pregnancy, childbirth and the puerperium: Secondary | ICD-10-CM

## 2015-07-01 LAB — COMPLETE METABOLIC PANEL WITH GFR
ALBUMIN: 3.3 g/dL — AB (ref 3.6–5.1)
ALK PHOS: 104 U/L (ref 33–115)
ALT: 8 U/L (ref 6–29)
AST: 13 U/L (ref 10–30)
BILIRUBIN TOTAL: 0.3 mg/dL (ref 0.2–1.2)
BUN: 9 mg/dL (ref 7–25)
CALCIUM: 9.2 mg/dL (ref 8.6–10.2)
CO2: 19 mmol/L — ABNORMAL LOW (ref 20–31)
CREATININE: 0.52 mg/dL (ref 0.50–1.10)
Chloride: 105 mmol/L (ref 98–110)
GFR, Est African American: >89 mL/min (ref >=60)
GFR, Est Non African American: >89 mL/min (ref >=60)
GLUCOSE: 91 mg/dL (ref 65–99)
Potassium: 4.3 mmol/L (ref 3.5–5.3)
SODIUM: 137 mmol/L (ref 135–146)
TOTAL PROTEIN: 6.2 g/dL (ref 6.1–8.1)

## 2015-07-01 LAB — CBC
HEMATOCRIT: 36.8 % (ref 35.0–45.0)
HEMOGLOBIN: 12.3 g/dL (ref 11.7–15.5)
MCH: 30.4 pg (ref 27.0–33.0)
MCHC: 33.4 g/dL (ref 32.0–36.0)
MCV: 90.9 fL (ref 80.0–100.0)
MPV: 10.6 fL (ref 7.5–12.5)
Platelets: 238 10*3/uL (ref 140–400)
RBC: 4.05 MIL/uL (ref 3.80–5.10)
RDW: 15.2 % — ABNORMAL HIGH (ref 11.0–15.0)
WBC: 11.2 10*3/uL — AB (ref 3.8–10.8)

## 2015-07-01 NOTE — Progress Notes (Signed)
Subjective:  Megan Powell is a 25 y.o. G2P1001 at [redacted]w[redacted]d being seen today for ongoing prenatal care.  She is currently monitored for the following issues for this low-risk pregnancy and has History of gestational hypertension; Supervision of normal pregnancy; Rh negative status during pregnancy; and Biological false positive RPR test on her problem list.  Patient reports headaches yesterday.  No report of epigastric pain or vision changes.  Contractions: Not present. Vag. Bleeding: None.  Movement: Present. Denies leaking of fluid.   The following portions of the patient's history were reviewed and updated as appropriate: allergies, current medications, past family history, past medical history, past social history, past surgical history and problem list. Problem list updated.  Objective:   Filed Vitals:   07/01/15 1010  BP: 132/88  Pulse: 109  Weight: 214 lb (97.07 kg)    Fetal Status: Fetal Heart Rate (bpm): 160 Fundal Height: 38 cm Movement: Present  Presentation: Vertex  General:  Alert, oriented and cooperative. Patient is in no acute distress.  Skin: Skin is warm and dry. No rash noted.   Cardiovascular: Normal heart rate noted  Respiratory: Normal respiratory effort, no problems with respiration noted  Abdomen: Soft, gravid, appropriate for gestational age. Pain/Pressure: Present     Pelvic: Vag. Bleeding: None Vag D/C Character: Thin   Cervical exam deferred        Extremities: Normal range of motion.  Edema: None  Mental Status: Normal mood and affect. Normal behavior. Normal judgment and thought content.   Urinalysis: Urine Protein: Negative Urine Glucose: Negative  Assessment and Plan:  Pregnancy: G2P1001 at [redacted]w[redacted]d  1. Supervision of normal pregnancy, third trimester - Reviewed GBS results  2. History of gestational hypertension - CMP, CBC, Protein Creatinine as baseline  Term labor symptoms and general obstetric precautions including but not limited to vaginal  bleeding, contractions, leaking of fluid and fetal movement were reviewed in detail with the patient. Please refer to After Visit Summary for other counseling recommendations.  Return in about 3 days (around 07/04/2015) for BP check only; one week appointment.   Venia Carbon Michiel Cowboy, CNM

## 2015-07-02 LAB — PROTEIN / CREATININE RATIO, URINE
Creatinine, Urine: 34 mg/dL (ref 20–320)
PROTEIN CREATININE RATIO: 118 mg/g{creat} (ref 21–161)
Total Protein, Urine: 4 mg/dL — ABNORMAL LOW (ref 5–24)

## 2015-07-05 ENCOUNTER — Ambulatory Visit: Payer: Medicaid Other | Admitting: *Deleted

## 2015-07-05 VITALS — BP 128/86 | HR 117

## 2015-07-05 DIAGNOSIS — Z8759 Personal history of other complications of pregnancy, childbirth and the puerperium: Secondary | ICD-10-CM

## 2015-07-05 NOTE — Progress Notes (Signed)
Pt here for BP check only  BP is 128/86.  Pt denies any dizziness or headaches.  She will continue with her 40 app and RTC on Friday for ROB

## 2015-07-08 ENCOUNTER — Ambulatory Visit (INDEPENDENT_AMBULATORY_CARE_PROVIDER_SITE_OTHER): Payer: Medicaid Other | Admitting: Family

## 2015-07-08 VITALS — BP 144/87 | HR 104 | Wt 217.0 lb

## 2015-07-08 DIAGNOSIS — Z3483 Encounter for supervision of other normal pregnancy, third trimester: Secondary | ICD-10-CM

## 2015-07-08 DIAGNOSIS — Z3493 Encounter for supervision of normal pregnancy, unspecified, third trimester: Secondary | ICD-10-CM

## 2015-07-08 DIAGNOSIS — Z8759 Personal history of other complications of pregnancy, childbirth and the puerperium: Secondary | ICD-10-CM

## 2015-07-08 NOTE — Progress Notes (Signed)
Subjective:  Megan Powell is a 25 y.o. G2P1001 at [redacted]w[redacted]d being seen today for ongoing prenatal care.  She is currently monitored for the following issues for this low-risk pregnancy and has History of gestational hypertension; Supervision of normal pregnancy; Rh negative status during pregnancy; and Biological false positive RPR test on her problem list.  Patient reports no complaints.  Denies  headache, vision changes, or epigastric pain.  Contractions: Not present. Vag. Bleeding: None.  Movement: Present. Denies leaking of fluid.   The following portions of the patient's history were reviewed and updated as appropriate: allergies, current medications, past family history, past medical history, past social history, past surgical history and problem list. Problem list updated.  Objective:   Filed Vitals:   07/08/15 1021  BP: 144/87  Pulse: 104  Weight: 217 lb (98.431 kg)    Fetal Status: Fetal Heart Rate (bpm): 155 Fundal Height: 40 cm Movement: Present  Presentation: Vertex  General:  Alert, oriented and cooperative. Patient is in no acute distress.  Skin: Skin is warm and dry. No rash noted.   Cardiovascular: Normal heart rate noted  Respiratory: Normal respiratory effort, no problems with respiration noted  Abdomen: Soft, gravid, appropriate for gestational age. Pain/Pressure: Present     Pelvic: Vag. Bleeding: None Vag D/C Character: Thin   Cervical exam performed Dilation: Fingertip Effacement (%): 0    Extremities: Normal range of motion.  Edema: None  Mental Status: Normal mood and affect. Normal behavior. Normal judgment and thought content.   Urinalysis: Urine Protein: Negative Urine Glucose: Negative  Assessment and Plan:  Pregnancy: G2P1001 at [redacted]w[redacted]d  1. Supervision of normal pregnancy, third trimester - Reviewed signs of labor  2. History of gestational hypertension - First elevated blood pressure; asymptomatic - Consulted with Dr. Roselie Awkward > have return on Monday for  BP check; reviewed prex symptoms  Term labor symptoms and general obstetric precautions including but not limited to vaginal bleeding, contractions, leaking of fluid and fetal movement were reviewed in detail with the patient. Please refer to After Visit Summary for other counseling recommendations.  Return in about 1 week (around 07/15/2015).   Venia Carbon Michiel Cowboy, CNM

## 2015-07-11 ENCOUNTER — Ambulatory Visit: Payer: Medicaid Other

## 2015-07-11 VITALS — BP 137/79 | HR 81

## 2015-07-11 DIAGNOSIS — Z8759 Personal history of other complications of pregnancy, childbirth and the puerperium: Secondary | ICD-10-CM

## 2015-07-11 NOTE — Progress Notes (Signed)
Reviewed signs and symptoms she will need to call for increased headache, or seeing spots/ dizzy. Patient states understanding. Patient reports baby moving well. Patient has regular scheduled OB appointment on Thursday May 18th. [name] RN BSN

## 2015-07-13 ENCOUNTER — Telehealth: Payer: Self-pay

## 2015-07-13 NOTE — Telephone Encounter (Signed)
Baby script called alerting high blood pressure readings for patients.   Attempted to call patient and left message for patient- left her message if she is having increase in headaches or dizziness or blurry vision to go be evaluated at [facility]. Patient is to come tomorrow for office visit. [name] RN BSN

## 2015-07-14 ENCOUNTER — Ambulatory Visit (INDEPENDENT_AMBULATORY_CARE_PROVIDER_SITE_OTHER): Payer: Medicaid Other | Admitting: Obstetrics & Gynecology

## 2015-07-14 VITALS — BP 131/88 | HR 107 | Wt 220.0 lb

## 2015-07-14 DIAGNOSIS — Z3483 Encounter for supervision of other normal pregnancy, third trimester: Secondary | ICD-10-CM

## 2015-07-14 DIAGNOSIS — Z3493 Encounter for supervision of normal pregnancy, unspecified, third trimester: Secondary | ICD-10-CM

## 2015-07-14 DIAGNOSIS — O360130 Maternal care for anti-D [Rh] antibodies, third trimester, not applicable or unspecified: Secondary | ICD-10-CM

## 2015-07-14 NOTE — Progress Notes (Signed)
Patient states she did have headache yesterday with her higher diastolic pressures in the 90s. [name]

## 2015-07-14 NOTE — Progress Notes (Signed)
Subjective:  Megan Powell is a 25 y.o. G2P1001 at [redacted]w[redacted]d being seen today for ongoing prenatal care.  She is currently monitored for the following issues for this low-risk pregnancy and has History of gestational hypertension; Supervision of normal pregnancy; Rh negative status during pregnancy; and Biological false positive RPR test on her problem list.  Patient reports no complaints.  Contractions: Irritability. Vag. Bleeding: None.  Movement: Present. Denies leaking of fluid.   The following portions of the patient's history were reviewed and updated as appropriate: allergies, current medications, past family history, past medical history, past social history, past surgical history and problem list. Problem list updated.  Objective:   Filed Vitals:   07/14/15 1309  BP: 131/88  Pulse: 107  Weight: 220 lb (99.791 kg)    Fetal Status: Fetal Heart Rate (bpm): 152   Movement: Present     General:  Alert, oriented and cooperative. Patient is in no acute distress.  Skin: Skin is warm and dry. No rash noted.   Cardiovascular: Normal heart rate noted  Respiratory: Normal respiratory effort, no problems with respiration noted  Abdomen: Soft, gravid, appropriate for gestational age. Pain/Pressure: Present     Pelvic: Vag. Bleeding: None Vag D/C Character: Thin   Cervical exam performed        Extremities: Normal range of motion.  Edema: Trace  Mental Status: Normal mood and affect. Normal behavior. Normal judgment and thought content.   Urinalysis: Urine Protein: Trace Urine Glucose: Negative  Assessment and Plan:  Pregnancy: G2P1001 at [redacted]w[redacted]d  1. Supervision of normal pregnancy, third trimester   2. Rh negative status during pregnancy, third trimester, not applicable or unspecified fetus   Term labor symptoms and general obstetric precautions including but not limited to vaginal bleeding, contractions, leaking of fluid and fetal movement were reviewed in detail with the  patient. Please refer to After Visit Summary for other counseling recommendations.  Return in about 1 week (around 07/21/2015).   Emily Filbert, MD

## 2015-07-18 ENCOUNTER — Encounter: Payer: Self-pay | Admitting: Obstetrics & Gynecology

## 2015-07-18 ENCOUNTER — Telehealth: Payer: Self-pay | Admitting: *Deleted

## 2015-07-18 DIAGNOSIS — K649 Unspecified hemorrhoids: Secondary | ICD-10-CM

## 2015-07-18 MED ORDER — HYDROCORTISONE 2.5 % RE CREA: 1.0000 "application " | TOPICAL_CREAM | Freq: Two times a day (BID) | RECTAL | Status: DC

## 2015-07-18 NOTE — Telephone Encounter (Signed)
Pt sent a message through my-chart stating that she has hemorrhoids and has used OTC meds without relief.  She is requesting a RX for Annusol.  This was sent to [facility].

## 2015-07-21 ENCOUNTER — Encounter (HOSPITAL_COMMUNITY): Payer: Self-pay

## 2015-07-21 ENCOUNTER — Inpatient Hospital Stay (HOSPITAL_COMMUNITY)
Admission: AD | Admit: 2015-07-21 | Discharge: 2015-07-24 | DRG: 775 | Disposition: A | Discharge status: Home / Self Care | Payer: Medicaid Other | Source: Ambulatory Visit | Attending: Family Medicine | Admitting: Family Medicine

## 2015-07-21 ENCOUNTER — Ambulatory Visit (INDEPENDENT_AMBULATORY_CARE_PROVIDER_SITE_OTHER): Payer: Medicaid Other | Admitting: Obstetrics & Gynecology

## 2015-07-21 VITALS — BP 139/79 | HR 91 | Wt 222.0 lb

## 2015-07-21 DIAGNOSIS — Z3A40 40 weeks gestation of pregnancy: Secondary | ICD-10-CM

## 2015-07-21 DIAGNOSIS — Z6838 Body mass index (BMI) 38.0-38.9, adult: Secondary | ICD-10-CM

## 2015-07-21 DIAGNOSIS — O99214 Obesity complicating childbirth: Secondary | ICD-10-CM | POA: Diagnosis present

## 2015-07-21 DIAGNOSIS — Z6791 Unspecified blood type, Rh negative: Secondary | ICD-10-CM

## 2015-07-21 DIAGNOSIS — R768 Other specified abnormal immunological findings in serum: Secondary | ICD-10-CM

## 2015-07-21 DIAGNOSIS — Z8249 Family history of ischemic heart disease and other diseases of the circulatory system: Secondary | ICD-10-CM

## 2015-07-21 DIAGNOSIS — Z3493 Encounter for supervision of normal pregnancy, unspecified, third trimester: Secondary | ICD-10-CM

## 2015-07-21 DIAGNOSIS — IMO0001 Reserved for inherently not codable concepts without codable children: Secondary | ICD-10-CM

## 2015-07-21 DIAGNOSIS — Z8759 Personal history of other complications of pregnancy, childbirth and the puerperium: Secondary | ICD-10-CM

## 2015-07-21 DIAGNOSIS — Z3483 Encounter for supervision of other normal pregnancy, third trimester: Secondary | ICD-10-CM

## 2015-07-21 DIAGNOSIS — O26893 Other specified pregnancy related conditions, third trimester: Principal | ICD-10-CM | POA: Diagnosis present

## 2015-07-21 DIAGNOSIS — O360130 Maternal care for anti-D [Rh] antibodies, third trimester, not applicable or unspecified: Secondary | ICD-10-CM

## 2015-07-21 NOTE — [facility] Note (Signed)
Recheck patient in 1 hour per CNM 

## 2015-07-21 NOTE — Progress Notes (Signed)
Subjective:  Megan Powell is a 25 y.o. G2P1001 at [redacted]w[redacted]d being seen today for ongoing prenatal care.  She is currently monitored for the following issues for this low-risk pregnancy and has History of gestational hypertension; Supervision of normal pregnancy; Rh negative status during pregnancy; and Biological false positive RPR test on her problem list.  Patient reports no complaints.  Contractions: Irregular. Vag. Bleeding: None.  Movement: Present. Denies leaking of fluid.   The following portions of the patient's history were reviewed and updated as appropriate: allergies, current medications, past family history, past medical history, past social history, past surgical history and problem list. Problem list updated.  Objective:   Filed Vitals:   07/21/15 1418  BP: 139/79  Pulse: 91  Weight: 222 lb (100.699 kg)    Fetal Status:     Movement: Present     General:  Alert, oriented and cooperative. Patient is in no acute distress.  Skin: Skin is warm and dry. No rash noted.   Cardiovascular: Normal heart rate noted  Respiratory: Normal respiratory effort, no problems with respiration noted  Abdomen: Soft, gravid, appropriate for gestational age. Pain/Pressure: Present     Pelvic: Vag. Bleeding: None Vag D/C Character: Mucous   Cervical exam performed        Extremities: Normal range of motion.  Edema: Mild pitting, slight indentation  Mental Status: Normal mood and affect. Normal behavior. Normal judgment and thought content.   Urinalysis: Urine Protein: Trace Urine Glucose: Negative  Assessment and Plan:  Pregnancy: G2P1001 at [redacted]w[redacted]d  1. History of gestational hypertension   2. Rh negative status during pregnancy, third trimester, not applicable or unspecified fetus   3. Supervision of normal pregnancy, third trimester   Term labor symptoms and general obstetric precautions including but not limited to vaginal bleeding, contractions, leaking of fluid and fetal movement were  reviewed in detail with the patient. Please refer to After Visit Summary for other counseling recommendations.  Return for RTC Tuesday for NST and visit.   Emily Filbert, MD

## 2015-07-21 NOTE — [facility] Note (Signed)
Pt reports contractions this pm, seen in office this am and was 3 cm.

## 2015-07-22 ENCOUNTER — Inpatient Hospital Stay (HOSPITAL_COMMUNITY): Payer: Medicaid Other | Admitting: Anesthesiology

## 2015-07-22 ENCOUNTER — Encounter (HOSPITAL_COMMUNITY): Payer: Self-pay | Admitting: *Deleted

## 2015-07-22 DIAGNOSIS — Z6791 Unspecified blood type, Rh negative: Secondary | ICD-10-CM | POA: Diagnosis not present

## 2015-07-22 DIAGNOSIS — O134 Gestational [pregnancy-induced] hypertension without significant proteinuria, complicating childbirth: Secondary | ICD-10-CM | POA: Diagnosis not present

## 2015-07-22 DIAGNOSIS — Z8249 Family history of ischemic heart disease and other diseases of the circulatory system: Secondary | ICD-10-CM | POA: Diagnosis not present

## 2015-07-22 DIAGNOSIS — O99214 Obesity complicating childbirth: Secondary | ICD-10-CM | POA: Diagnosis present

## 2015-07-22 DIAGNOSIS — Z3A40 40 weeks gestation of pregnancy: Secondary | ICD-10-CM | POA: Diagnosis not present

## 2015-07-22 DIAGNOSIS — IMO0001 Reserved for inherently not codable concepts without codable children: Secondary | ICD-10-CM

## 2015-07-22 DIAGNOSIS — Z6838 Body mass index (BMI) 38.0-38.9, adult: Secondary | ICD-10-CM | POA: Diagnosis not present

## 2015-07-22 DIAGNOSIS — O26893 Other specified pregnancy related conditions, third trimester: Secondary | ICD-10-CM | POA: Diagnosis present

## 2015-07-22 LAB — CBC
HCT: 37.6 % (ref 36.0–46.0)
HEMATOCRIT: 35.9 % — AB (ref 36.0–46.0)
Hemoglobin: 12.8 g/dL (ref 12.0–15.0)
Hemoglobin: 12 g/dL (ref 12.0–15.0)
MCH: 30.3 pg (ref 26.0–34.0)
MCH: 30.1 pg (ref 26.0–34.0)
MCHC: 34 g/dL (ref 30.0–36.0)
MCHC: 33.4 g/dL (ref 30.0–36.0)
MCV: 88.9 fL (ref 78.0–100.0)
MCV: 90 fL (ref 78.0–100.0)
PLATELETS: 242 10*3/uL (ref 150–400)
PLATELETS: 211 10*3/uL (ref 150–400)
RBC: 4.23 MIL/uL (ref 3.87–5.11)
RBC: 3.99 MIL/uL (ref 3.87–5.11)
RDW: 15.6 % — ABNORMAL HIGH (ref 11.5–15.5)
RDW: 15.6 % — AB (ref 11.5–15.5)
WBC: 14.5 10*3/uL — ABNORMAL HIGH (ref 4.0–10.5)
WBC: 18.4 10*3/uL — AB (ref 4.0–10.5)

## 2015-07-22 LAB — TYPE AND SCREEN
ABO/RH(D): A NEG
Antibody Screen: NEGATIVE

## 2015-07-22 LAB — RPR: RPR Ser Ql: NONREACTIVE

## 2015-07-22 MED ORDER — OXYCODONE-ACETAMINOPHEN 5-325 MG PO TABS
2.0000 | ORAL_TABLET | ORAL | Status: DC | PRN
Start: 2015-07-22 — End: 2015-07-22

## 2015-07-22 MED ORDER — BENZOCAINE-MENTHOL 20-0.5 % EX AERO: 1.0000 "application " | INHALATION_SPRAY | CUTANEOUS | Status: DC | PRN

## 2015-07-22 MED ORDER — LACTATED RINGERS IV SOLN
INTRAVENOUS | Status: DC
Start: 2015-07-22 — End: 2015-07-22
  Administered 2015-07-22: 03:00:00 via INTRAVENOUS

## 2015-07-22 MED ORDER — PHENYLEPHRINE 40 MCG/ML (10ML) SYRINGE FOR IV PUSH (FOR BLOOD PRESSURE SUPPORT)
80.0000 ug | PREFILLED_SYRINGE | INTRAVENOUS | Status: DC | PRN
  Filled 2015-07-22: qty 5
  Filled 2015-07-22: qty 10

## 2015-07-22 MED ORDER — FLEET ENEMA 7-19 GM/118ML RE ENEM: 1.0000 | ENEMA | RECTAL | Status: DC | PRN

## 2015-07-22 MED ORDER — SENNOSIDES-DOCUSATE SODIUM 8.6-50 MG PO TABS
2.0000 | ORAL_TABLET | ORAL | Status: DC
  Administered 2015-07-22 – 2015-07-23 (×2): 2 via ORAL
  Filled 2015-07-22 (×2): qty 2

## 2015-07-22 MED ORDER — SIMETHICONE 80 MG PO CHEW: 80.0000 mg | CHEWABLE_TABLET | ORAL | Status: DC | PRN

## 2015-07-22 MED ORDER — LIDOCAINE HCL (PF) 1 % IJ SOLN
INTRAMUSCULAR | Status: DC | PRN
  Administered 2015-07-22 (×2): 4 mL

## 2015-07-22 MED ORDER — HYDROCORTISONE 2.5 % RE CREA
1.0000 "application " | TOPICAL_CREAM | Freq: Two times a day (BID) | RECTAL | Status: DC | PRN
  Administered 2015-07-22: 1 via RECTAL
  Filled 2015-07-22: qty 28.35

## 2015-07-22 MED ORDER — DIPHENHYDRAMINE HCL 25 MG PO CAPS: 25.0000 mg | ORAL_CAPSULE | Freq: Four times a day (QID) | ORAL | Status: DC | PRN

## 2015-07-22 MED ORDER — PRENATAL MULTIVITAMIN CH
1.0000 | ORAL_TABLET | Freq: Every day | ORAL | Status: DC
  Administered 2015-07-23: 1 via ORAL
  Filled 2015-07-22: qty 1

## 2015-07-22 MED ORDER — FENTANYL CITRATE (PF) 100 MCG/2ML IJ SOLN: 50.0000 ug | INTRAMUSCULAR | Status: DC | PRN

## 2015-07-22 MED ORDER — TETANUS-DIPHTH-ACELL PERTUSSIS 5-2.5-18.5 LF-MCG/0.5 IM SUSP: 0.5000 mL | Freq: Once | INTRAMUSCULAR | Status: DC

## 2015-07-22 MED ORDER — EPHEDRINE 5 MG/ML INJ
10.0000 mg | INTRAVENOUS | Status: DC | PRN
  Filled 2015-07-22: qty 2

## 2015-07-22 MED ORDER — ONDANSETRON HCL 4 MG/2ML IJ SOLN: 4.0000 mg | INTRAMUSCULAR | Status: DC | PRN

## 2015-07-22 MED ORDER — DIPHENHYDRAMINE HCL 50 MG/ML IJ SOLN: 12.5000 mg | INTRAMUSCULAR | Status: DC | PRN

## 2015-07-22 MED ORDER — IBUPROFEN 600 MG PO TABS
600.0000 mg | ORAL_TABLET | Freq: Four times a day (QID) | ORAL | Status: DC
  Administered 2015-07-22 – 2015-07-24 (×5): 600 mg via ORAL
  Filled 2015-07-22 (×6): qty 1

## 2015-07-22 MED ORDER — ACETAMINOPHEN 325 MG PO TABS: 650.0000 mg | ORAL_TABLET | ORAL | Status: DC | PRN

## 2015-07-22 MED ORDER — LACTATED RINGERS IV SOLN
500.0000 mL | Freq: Once | INTRAVENOUS | Status: DC
Start: 2015-07-22 — End: 2015-07-22

## 2015-07-22 MED ORDER — OXYTOCIN BOLUS FROM INFUSION
500.0000 mL | INTRAVENOUS | Status: DC
  Administered 2015-07-22: 500 mL via INTRAVENOUS

## 2015-07-22 MED ORDER — WITCH HAZEL-GLYCERIN EX PADS: MEDICATED_PAD | CUTANEOUS | Status: DC | PRN

## 2015-07-22 MED ORDER — COCONUT OIL OIL: 1.0000 "application " | TOPICAL_OIL | Status: DC | PRN

## 2015-07-22 MED ORDER — ONDANSETRON HCL 4 MG PO TABS: 4.0000 mg | ORAL_TABLET | ORAL | Status: DC | PRN

## 2015-07-22 MED ORDER — SOD CITRATE-CITRIC ACID 500-334 MG/5ML PO SOLN: 30.0000 mL | ORAL | Status: DC | PRN

## 2015-07-22 MED ORDER — LACTATED RINGERS IV SOLN: 500.0000 mL | INTRAVENOUS | Status: DC | PRN

## 2015-07-22 MED ORDER — OXYTOCIN 40 UNITS IN LACTATED RINGERS INFUSION - SIMPLE MED
2.5000 [IU]/h | INTRAVENOUS | Status: DC
  Filled 2015-07-22: qty 1000

## 2015-07-22 MED ORDER — ONDANSETRON HCL 4 MG/2ML IJ SOLN: 4.0000 mg | Freq: Four times a day (QID) | INTRAMUSCULAR | Status: DC | PRN

## 2015-07-22 MED ORDER — LIDOCAINE HCL (PF) 1 % IJ SOLN
30.0000 mL | INTRAMUSCULAR | Status: DC | PRN
  Filled 2015-07-22: qty 30

## 2015-07-22 MED ORDER — FENTANYL 2.5 MCG/ML BUPIVACAINE 1/10 % EPIDURAL INFUSION (WH - ANES): 14.0000 mL/h | INTRAMUSCULAR | Status: DC | PRN

## 2015-07-22 MED ORDER — PHENYLEPHRINE 40 MCG/ML (10ML) SYRINGE FOR IV PUSH (FOR BLOOD PRESSURE SUPPORT)
80.0000 ug | PREFILLED_SYRINGE | INTRAVENOUS | Status: DC | PRN
  Filled 2015-07-22: qty 5

## 2015-07-22 MED ORDER — FENTANYL 2.5 MCG/ML BUPIVACAINE 1/10 % EPIDURAL INFUSION (WH - ANES)
14.0000 mL/h | INTRAMUSCULAR | Status: DC | PRN
  Administered 2015-07-22: 14 mL/h via EPIDURAL

## 2015-07-22 MED ORDER — FENTANYL 2.5 MCG/ML BUPIVACAINE 1/10 % EPIDURAL INFUSION (WH - ANES)
14.0000 mL/h | INTRAMUSCULAR | Status: DC | PRN
  Administered 2015-07-22: 14 mL/h via EPIDURAL
  Filled 2015-07-22: qty 125

## 2015-07-22 MED ORDER — OXYCODONE-ACETAMINOPHEN 5-325 MG PO TABS
1.0000 | ORAL_TABLET | ORAL | Status: DC | PRN
Start: 2015-07-22 — End: 2015-07-22

## 2015-07-22 NOTE — Anesthesia Procedure Notes (Signed)
Epidural Patient location during procedure: OB  Staffing Anesthesiologist: Nolon Nations Performed by: anesthesiologist   Preanesthetic Checklist Completed: patient identified, pre-op evaluation, timeout performed, IV checked, risks and benefits discussed and monitors and equipment checked  Epidural Patient position: sitting Prep: site prepped and draped and DuraPrep Patient monitoring: heart rate Approach: midline Location: L3-L4 Injection technique: LOR air and LOR saline  Needle:  Needle type: Tuohy  Needle gauge: 17 G Needle length: 9 cm Needle insertion depth: 7 cm Catheter type: closed end flexible Catheter size: 19 Gauge Catheter at skin depth: 13 cm Test dose: negative  Assessment Sensory level: T8 Events: blood not aspirated, injection not painful, no injection resistance, negative IV test and no paresthesia  Additional Notes Reason for block:procedure for pain

## 2015-07-22 NOTE — Progress Notes (Signed)
Megan Powell is a 25 y.o. G2P1001 at [redacted]w[redacted]d by ultrasound admitted for labor.  Subjective: Comfortable, no pain w/ctx. Feels tired.  Objective: BP 129/65 mmHg  Pulse 94  Temp(Src) 97.9 F (36.6 C) (Oral)  Resp 20  Ht 5\' 4"  (1.626 m)  Wt 222 lb (100.699 kg)  BMI 38.09 kg/m2  SpO2 100%  LMP 10/09/2014     FHT:  FHR: 130 bpm, variability: moderate,  accelerations:  Present,  decelerations:  Absent UC:   irregular, every 4-6 minutes SVE:   Dilation: 5 Effacement (%): 70 Station: -2 Exam by:: M Bambrhi AROM: small, clear  Labs: Lab Results  Component Value Date   WBC 14.5* 07/22/2015   HGB 12.8 07/22/2015   HCT 37.6 07/22/2015   MCV 88.9 07/22/2015   PLT 242 07/22/2015    Assessment / Plan: Augmentation of labor, progressing well  Labor: Progressing normally Preeclampsia:  no signs or symptoms of toxicity Fetal Wellbeing:  Category I Pain Control:  none I/D:  n/a Anticipated MOD:  NSVD  Julianne Handler 07/22/2015, 1:48 PM

## 2015-07-22 NOTE — Progress Notes (Signed)
Doresa Daria is a 25 y.o. G2P1001 at [redacted]w[redacted]d admitted for contractions with cervical changes noted.  Subjective: Believes that contractions are getting stronger and closer together. Reports pain control is adequate.  Objective: BP 134/77 mmHg  Pulse 80  Temp(Src) 98.1 F (36.7 C) (Oral)  Resp 18  Ht 5\' 4"  (1.626 m)  Wt 222 lb (100.699 kg)  BMI 38.09 kg/m2  SpO2 100%  LMP 10/09/2014      FHT:  FHR: 130 bpm, variability: moderate,  accelerations:  Present,  decelerations:  Absent UC:   none SVE:   Dilation: 3.5 Effacement (%): 80 Station: -3 Exam by:: B [employer] RN   Labs: Lab Results  Component Value Date   WBC 14.5* 07/22/2015   HGB 12.8 07/22/2015   HCT 37.6 07/22/2015   MCV 88.9 07/22/2015   PLT 242 07/22/2015    Assessment / Plan: Spontaneous labor, progressing normally  Labor: Progressing normally. No adequate contractions noted. Consider augmentation vs. discharge if does not progress further. Preeclampsia:  no signs or symptoms of toxicity Fetal Wellbeing:  Category I Pain Control:  Labor support without medications I/D:  n/a Anticipated MOD:  NSVD  [facility] 07/22/2015, 6:03 AM

## 2015-07-22 NOTE — Progress Notes (Signed)
Bernis Huey is a 25 y.o. G2P1001 at [redacted]w[redacted]d by ultrasound admitted for labor.  Subjective: Comfortable with epidural, no c/o.  Objective: BP 122/79 mmHg  Pulse 94  Temp(Src) 97.9 F (36.6 C) (Oral)  Resp 18  Ht 5\' 4"  (1.626 m)  Wt 222 lb (100.699 kg)  BMI 38.09 kg/m2  SpO2 99%  LMP 10/09/2014      FHT:  FHR: 135 bpm, variability: moderate,  accelerations:  Abscent,  decelerations:  Present occ. variable UC:   regular, every 2-4 minutes SVE:   Dilation: 6.5 Effacement (%): 70 Station: -2 Exam by:: Casilda Carls RN  Labs: Lab Results  Component Value Date   WBC 14.5* 07/22/2015   HGB 12.8 07/22/2015   HCT 37.6 07/22/2015   MCV 88.9 07/22/2015   PLT 242 07/22/2015    Assessment / Plan: Spontaneous labor, progressing normally  Labor: Progressing normally Preeclampsia:  no signs or symptoms of toxicity Fetal Wellbeing:  Category II Pain Control:  Epidural I/D:  n/a Anticipated MOD:  NSVD  Julianne Handler 07/22/2015, 5:34 PM

## 2015-07-22 NOTE — MAU Note (Signed)
Recheck pt in one hour per MD

## 2015-07-22 NOTE — Anesthesia Pain Management Evaluation Note (Signed)
  CRNA Pain Management Visit Note  Patient: Megan Powell, 25 y.o., female  "Hello I am a member of the anesthesia team at [facility]. We have an anesthesia team available at all times to provide care throughout the hospital, including epidural management and anesthesia for C-section. I don't know your plan for the delivery whether it a natural birth, water birth, IV sedation, nitrous supplementation, doula or epidural, but we want to meet your pain goals."   1.Was your pain managed to your expectations on prior hospitalizations?   Yes   2.What is your expectation for pain management during this hospitalization?     Epidural  3.How can we help you reach that goal? epidural  Record the patient's initial score and the patient's pain goal.   Pain: 6  Pain Goal: 8 [facility] wants you to be able to say your pain was always managed very well.  Anayelli Lai 07/22/2015

## 2015-07-22 NOTE — Anesthesia Preprocedure Evaluation (Addendum)
Anesthesia Evaluation  Patient identified by MRN, date of birth, ID band Patient awake    Reviewed: Allergy & Precautions, H&P , Patient's Chart, lab work & pertinent test results  Airway Mallampati: III  TM Distance: >3 FB Neck ROM: full    Dental  (+) Teeth Intact   Pulmonary    breath sounds clear to auscultation       Cardiovascular hypertension,  Rhythm:regular Rate:Normal     Neuro/Psych    GI/Hepatic   Endo/Other  Morbid obesity  Renal/GU      Musculoskeletal   Abdominal   Peds  Hematology   Anesthesia Other Findings       Reproductive/Obstetrics (+) Pregnancy                             Anesthesia Physical  Anesthesia Plan  ASA: III  Anesthesia Plan: Epidural   Post-op Pain Management:    Induction:   Airway Management Planned:   Additional Equipment:   Intra-op Plan:   Post-operative Plan:   Informed Consent: I have reviewed the patients History and Physical, chart, labs and discussed the procedure including the risks, benefits and alternatives for the proposed anesthesia with the patient or authorized representative who has indicated his/her understanding and acceptance.   Dental Advisory Given  Plan Discussed with:   Anesthesia Plan Comments: (Labs checked- platelets confirmed with RN in room. Fetal heart tracing, per RN, reported to be stable enough for sitting procedure. Discussed epidural, and patient consents to the procedure:  included risk of possible headache,backache, failed block, allergic reaction, and nerve injury. This patient was asked if she had any questions or concerns before the procedure started.)        Anesthesia Quick Evaluation

## 2015-07-22 NOTE — Progress Notes (Signed)
Megan Powell is a 25 y.o. G2P1001 at [redacted]w[redacted]d admitted for contractions with cervical change.  Subjective: Continues to feel contractions are growing stronger. Ambulated.   Objective: BP 134/77 mmHg  Pulse 80  Temp(Src) 98.1 F (36.7 C) (Oral)  Resp 18  Ht 5\' 4"  (1.626 m)  Wt 222 lb (100.699 kg)  BMI 38.09 kg/m2  SpO2 100%  LMP 10/09/2014      FHT: Has been off of monitor, ambulating SVE:   Dilation: 4.5 Effacement (%): 80 Station: -3 Exam by:: B [employer]: Lab Results  Component Value Date   WBC 14.5* 07/22/2015   HGB 12.8 07/22/2015   HCT 37.6 07/22/2015   MCV 88.9 07/22/2015   PLT 242 07/22/2015    Assessment / Plan: Spontaneous labor, progressing normally  Labor: Progressing normally. Appears to be making cervical change. Will continue to monitor. Preeclampsia:  no signs or symptoms of toxicity Pain Control:  Labor support without medications I/D:  n/a Anticipated MOD:  NSVD  [facility] 07/22/2015, 7:20 AM

## 2015-07-22 NOTE — H&P (Signed)
LABOR ADMISSION HISTORY AND PHYSICAL  Megan Powell is a 25 y.o. female G2P1001 with IUP at [redacted]w[redacted]d by 7week [place] presenting for contractions. She reports +FMs, No LOF, no VB, no blurry vision, headaches or peripheral edema, and RUQ pain.  She plans on breast feeding. She requests POP for birth control. Declines circumcision for infant. History of gestational HTN noted, not currently on medication. Rh negative. RPR reactive, however RPR titer 1:1 and treponemal antibodies nonreactive--determined to be a false positive.  Dating: By [place] --->  Estimated Date of Delivery: 07/22/15  @[redacted]w[redacted]d , CWD, normal anatomy, cephalic presentation   Prenatal History/Complications:  Past Medical History: Past Medical History  Diagnosis Date  . Medical history non-contributory   . Hypertension affecting pregnancy     Past Surgical History: Past Surgical History  Procedure Laterality Date  . No past surgeries      Obstetrical History: OB History    Gravida Para Term Preterm AB TAB SAB Ectopic Multiple Living   2 1 1       1       Social History: Social History   Social History  . Marital Status: Married    Spouse Name: N/A  . Number of Children: N/A  . Years of Education: N/A   Occupational History  . homemaker    Social History Main Topics  . Smoking status: Never Smoker   . Smokeless tobacco: Never Used  . Alcohol Use: No  . Drug Use: No  . Sexual Activity:    Partners: Male   Other Topics Concern  . None   Social History Narrative    Family History: Family History  Problem Relation Age of Onset  . Hypertension Father     Allergies: No Known Allergies  Prescriptions prior to admission  Medication Sig Dispense Refill Last Dose  . Prenatal Vit-Fe Fumarate-FA (PRENATAL MULTIVITAMIN) TABS tablet Take 1 tablet by mouth daily at 12 noon.   Past Week at Unknown time  . hydrocortisone (ANUSOL-HC) 2.5 % rectal cream Place 1 application rectally 2 (two) times daily. 30 g 0 Taking      Review of Systems   All systems reviewed and negative except as stated in HPI  Blood pressure 135/88, pulse 95, temperature 97.9 F (36.6 C), temperature source Oral, resp. rate 18, height 5\' 4"  (1.626 m), weight 222 lb (100.699 kg), last menstrual period 10/09/2014, SpO2 100 %, unknown if currently breastfeeding. General appearance: alert, cooperative and no distress Lungs: clear to auscultation bilaterally Heart: regular rate and rhythm Abdomen: soft, non-tender; bowel sounds normal Extremities: Homans sign is negative, no sign of DVT Presentation: cephalic Fetal monitoringBaseline: 130 bpm and Variability: Good {> 6 bpm) Uterine activity: Regular  Dilation: 4.5 Effacement (%): 80 Station: -2 Exam by:: Passenger transport manager RN   Prenatal labs: ABO, Rh: A/NEG/-- (10/10 1047) Antibody: NEG (03/03 1058) Rubella: !Error! RPR: REACTIVE (03/03 1058)  HBsAg: NEGATIVE (10/10 1047)  HIV: NONREACTIVE (03/03 1058)  GBS: Negative (04/28 0000)  1 hr Glucola 85 Genetic screening  Negative Anatomy US Normal   No results found for this or any previous visit (from the past 24 hour(s)).  Patient Active Problem List   Diagnosis Date Noted  . Biological false positive RPR test 05/10/2015  . History of gestational hypertension 12/06/2014  . Supervision of normal pregnancy 12/06/2014  . Rh negative status during pregnancy 12/06/2014    Assessment: Megan Powell is a 25 y.o. G2P1001 at [redacted]w[redacted]d here for contractions.  #Labor: Progressing normally #Pain: Oral pain medication #  FWB: Category I #ID:  GBS negative #MOF: Breast #MOC: POP #Circ:  No # Rh negative: Will need Rhogam prior to discharge # Gestational HTN: Not currently on medication. Denies symptoms of pre-eclampsia. Continue to monitor.  [facility] 07/22/2015, 3:14 AM   I have seen the above pt and agree with plan.

## 2015-07-22 NOTE — Lactation Note (Signed)
This note was copied from a baby's chart. Lactation Consultation Note Initial visit at 4 hours of age.  Mom reports 16 months experience with breastfeeding older child with weaning a few months ago.  Mom reports good latch and a few feedings for this baby already.  Mom denies pain with latch. Baby asleep in moms arms.  [facility] LC resources given and discussed.  Encouraged to feed with early cues on demand.  Early newborn behavior discussed.  Hand expression demonstrated by mom with colostrum visible.  Mom to call for assist as needed.    Patient Name: Megan Powell [id number] Date: 07/22/2015 Reason for consult: Initial assessment   Maternal Data Formula Feeding for Exclusion: No Has patient been taught Hand Expression?: Yes Does the patient have breastfeeding experience prior to this delivery?: Yes  Feeding Feeding Type: Breast Fed Length of feed: 20 min  LATCH Score/Interventions Latch: Grasps breast easily, tongue down, lips flanged, rhythmical sucking.  Audible Swallowing: A few with stimulation  Type of Nipple: Everted at rest and after stimulation  Comfort (Breast/Nipple): Soft / non-tender     Hold (Positioning): No assistance needed to correctly position infant at breast. Intervention(s): Breastfeeding basics reviewed  LATCH Score: 9  Lactation Tools Discussed/Used     Consult Status Consult Status: Follow-up Date: 07/23/15 Follow-up type: In-patient    Shoptaw, Justine Null 07/22/2015, 10:27 PM

## 2015-07-23 LAB — CBC
HCT: 32.2 % — ABNORMAL LOW (ref 36.0–46.0)
Hemoglobin: 10.5 g/dL — ABNORMAL LOW (ref 12.0–15.0)
MCH: 29.6 pg (ref 26.0–34.0)
MCHC: 32.6 g/dL (ref 30.0–36.0)
MCV: 90.7 fL (ref 78.0–100.0)
PLATELETS: 180 10*3/uL (ref 150–400)
RBC: 3.55 MIL/uL — ABNORMAL LOW (ref 3.87–5.11)
RDW: 15.8 % — ABNORMAL HIGH (ref 11.5–15.5)
WBC: 15.1 10*3/uL — ABNORMAL HIGH (ref 4.0–10.5)

## 2015-07-23 MED ORDER — RHO D IMMUNE GLOBULIN 1500 UNIT/2ML IJ SOSY
300.0000 ug | PREFILLED_SYRINGE | Freq: Once | INTRAMUSCULAR | Status: AC
  Administered 2015-07-23: 300 ug via INTRAVENOUS
  Filled 2015-07-23: qty 2

## 2015-07-23 NOTE — Progress Notes (Signed)
POSTPARTUM PROGRESS NOTE  Post Partum Day 1 Subjective:  Megan Powell is a 25 y.o. G2P2002 [redacted]w[redacted]d s/p nsvd.  No acute events overnight.  Pt denies problems with ambulating, voiding or po intake.  She denies nausea or vomiting.  Pain is well controlled.  She has had flatus. She has not had bowel movement.  Lochia Small.   Objective: Blood pressure 108/52, pulse 75, temperature 98.5 F (36.9 C), temperature source Oral, resp. rate 18, height 5\' 4"  (1.626 m), weight 222 lb (100.699 kg), last menstrual period 10/09/2014, SpO2 99 %, unknown if currently breastfeeding.  Physical Exam:  General: alert, cooperative and no distress Lochia:normal flow Chest: CTAB Heart: RRR no m/r/g Abdomen: +BS, soft, nontender,  Uterine Fundus: firm,  DVT Evaluation: No calf swelling or tenderness Extremities: trace edema   Recent Labs  07/22/15 1928 07/23/15 0527  HGB 12.0 10.5*  HCT 35.9* 32.2*    Assessment/Plan:  ASSESSMENT: Megan Powell is a 25 y.o. [id number] [redacted]w[redacted]d s/p nsvd, doing well  Plan for discharge tomorrow   LOS: 1 day   Desma Maxim 07/23/2015, 6:53 AM

## 2015-07-23 NOTE — Anesthesia Postprocedure Evaluation (Signed)
Anesthesia Post Note  Patient: Megan Powell  Procedure(s) Performed: * No procedures listed *  Patient location during evaluation: Mother Baby Anesthesia Type: Epidural Level of consciousness: awake and alert and oriented Pain management: pain level controlled Vital Signs Assessment: post-procedure vital signs reviewed and stable Respiratory status: spontaneous breathing Cardiovascular status: blood pressure returned to baseline and stable Postop Assessment: no headache, adequate PO intake, no backache, patient able to bend at knees, epidural receding and no signs of nausea or vomiting     Last Vitals:  Filed Vitals:   07/23/15 0900 07/23/15 1900  BP: 109/63 126/81  Pulse: 84 79  Temp: 36.7 C 36.9 C  Resp: 18 18    Last Pain:  Filed Vitals:   07/23/15 1923  PainSc: 0-No pain   Pain Goal:                 Ailene Ards

## 2015-07-24 LAB — RH IG WORKUP (INCLUDES ABO/RH)
ABO/RH(D): A NEG
Fetal Screen: NEGATIVE
GESTATIONAL AGE(WKS): 40
Unit division: 0

## 2015-07-24 MED ORDER — IBUPROFEN 600 MG PO TABS: 600.0000 mg | ORAL_TABLET | Freq: Four times a day (QID) | ORAL | Status: DC

## 2015-07-24 MED ORDER — SENNOSIDES-DOCUSATE SODIUM 8.6-50 MG PO TABS: 2.0000 | ORAL_TABLET | ORAL | Status: DC

## 2015-07-24 NOTE — Discharge Summary (Deleted)
Obstetric Discharge Summary Reason for Admission: onset of labor Prenatal Procedures: none Intrapartum Procedures: spontaneous vaginal delivery Postpartum Procedures: Rho(D) Ig Complications-Operative and Postpartum: none  Delivery Note At 6:08 PM a viable female was delivered via Vaginal, Spontaneous Delivery (Presentation: Right Occiput Anterior). APGAR: 7, 9; weight pending.  Placenta status: Intact, Spontaneous. Cord: 3 vessels with the following complications: Nuchal x1, tight, delivered through via somersault. None. Cord pH: n/a  Anesthesia: Epidural  Episiotomy: None Lacerations: Labial, Rt-small, hemostatic Est. Blood Loss (mL): 25  Mom to postpartum. Baby to Couplet care / Skin to Skin.  Hospital Course:  Active Problems:   Active labor   Megan Powell is a 25 y.o. [id number] s/p SVD.  Patient was admitted for contractions after cervical changes were noted.  She has postpartum course that was uncomplicated including no problems with ambulating, PO intake, urination, pain, or bleeding. The pt feels ready to go home and  will be discharged with outpatient follow-up.   Today: No acute events overnight.  Pt denies problems with ambulating, voiding or po intake.  She denies nausea or vomiting.  Pain is well controlled.  She has had flatus. She has not had bowel movement.  Lochia Minimal.  Plan for birth control is  oral progesterone-only contraceptive.  Method of Feeding: Breast  Physical Exam:  General: alert, cooperative and no distress Lochia: appropriate Uterine Fundus: firm DVT Evaluation: No evidence of DVT seen on physical exam.  H/H: Lab Results  Component Value Date/Time   HGB 10.5* 07/23/2015 05:27 AM   HCT 32.2* 07/23/2015 05:27 AM    Discharge Diagnoses: Term Pregnancy-delivered  Discharge Information: Date: 07/24/2015 Activity: pelvic rest Diet: routine  Medications: Ibuprofen Breast feeding:  Yes Condition: stable Instructions: refer to  handout Discharge to: home     Medication List    TAKE these medications        hydrocortisone 2.5 % rectal cream  Commonly known as:  ANUSOL-HC  Place 1 application rectally 2 (two) times daily.     ibuprofen 600 MG tablet  Commonly known as:  ADVIL,MOTRIN  Take 1 tablet (600 mg total) by mouth every 6 (six) hours.     prenatal multivitamin Tabs tablet  Take 1 tablet by mouth daily at 12 noon.     senna-docusate 8.6-50 MG tablet  Commonly known as:  Senokot-S  Take 2 tablets by mouth daily.       Follow-up Information    Follow up with [facility] [place]. Schedule an appointment as soon as possible for a visit in 6 weeks.   Specialty:  Obstetrics and Gynecology   Contact information:   [place], [place], [place] 07/24/2015,8:34 AM

## 2015-07-24 NOTE — Discharge Summary (Signed)
OB Discharge Summary     Patient Name: Megan Powell DOB: 1990/04/22 MRN: [id number]  Date of admission: 07/21/2015 Delivering Provider: Julianne Handler   Date of discharge: 07/24/2015  Admitting diagnosis: 40w labor, ctx 5-7 min Intrauterine pregnancy: [redacted]w[redacted]d     Secondary diagnosis:  Active Problems:   Active labor  Additional Problems: Rh negative     Discharge diagnosis: Term Pregnancy Delivered                                                                                                Post partum procedures:rhogam  Augmentation: None  Complications: None  Hospital course:  Onset of Labor With Vaginal Delivery     25 y.o. yo [id number] at [redacted]w[redacted]d was admitted in Active Labor on 07/21/2015. Patient had an uncomplicated labor course as follows:  Membrane Rupture Time/Date: 1:33 PM ,07/22/2015   Intrapartum Procedures: Episiotomy: None [1]                                         Lacerations:  Labial [10]  Patient had a delivery of a Viable infant. 07/22/2015  Information for the patient's newborn:  Marchel, Drum [id number]  Delivery Method: Vaginal, Spontaneous Delivery (Filed from Delivery Summary)    Pateint had an uncomplicated postpartum course.  She is ambulating, tolerating a regular diet, passing flatus, and urinating well. Patient is discharged home in stable condition on 07/24/2015.    Physical exam  Filed Vitals:   07/23/15 0517 07/23/15 0900 07/23/15 1900 07/24/15 0609  BP: 108/52 109/63 126/81 114/47  Pulse: 75 84 79 64  Temp: 98.5 F (36.9 C) 98.1 F (36.7 C) 98.5 F (36.9 C) 98.5 F (36.9 C)  TempSrc: Oral Oral Oral Oral  Resp: 18 18 18 18   Height:      Weight:      SpO2:  99% 98%    General: alert Lochia: appropriate Uterine Fundus: firm Incision: N/A DVT Evaluation: No evidence of DVT seen on physical exam. Labs: Lab Results  Component Value Date   WBC 15.1* 07/23/2015   HGB 10.5* 07/23/2015   HCT 32.2* 07/23/2015   MCV 90.7  07/23/2015   PLT 180 07/23/2015   CMP Latest Ref Rng 07/01/2015  Glucose 65 - 99 mg/dL 91  BUN 7 - 25 mg/dL 9  Creatinine 0.50 - 1.10 mg/dL 0.52  Sodium 135 - 146 mmol/L 137  Potassium 3.5 - 5.3 mmol/L 4.3  Chloride 98 - 110 mmol/L 105  CO2 20 - 31 mmol/L 19(L)  Calcium 8.6 - 10.2 mg/dL 9.2  Total Protein 6.1 - 8.1 g/dL 6.2  Total Bilirubin 0.2 - 1.2 mg/dL 0.3  Alkaline Phos 33 - 115 U/L 104  AST 10 - 30 U/L 13  ALT 6 - 29 U/L 8    Discharge instruction: per After Visit Summary and "Baby and Me Booklet".  After visit meds:    Medication List    TAKE these medications        hydrocortisone 2.5 %  rectal cream  Commonly known as:  ANUSOL-HC  Place 1 application rectally 2 (two) times daily.     ibuprofen 600 MG tablet  Commonly known as:  ADVIL,MOTRIN  Take 1 tablet (600 mg total) by mouth every 6 (six) hours.     prenatal multivitamin Tabs tablet  Take 1 tablet by mouth daily at 12 noon.     senna-docusate 8.6-50 MG tablet  Commonly known as:  Senokot-S  Take 2 tablets by mouth daily.        Diet: routine diet  Activity: Advance as tolerated. Pelvic rest for 6 weeks.   Outpatient follow up:6 weeks Follow up Appt:No future appointments. Follow up Visit:No Follow-up on file.  Postpartum contraception: Progesterone only pills  Newborn Data: Live born female  Birth Weight: 8 lb 11.9 oz (3966 g) APGAR: 7, 9  Baby Feeding: Breast Disposition:home with mother   07/24/2015 Junie Panning, DO  I spoke with and examined patient and agree with resident/PA/SNM's note and plan of care.  Roma Schanz, CNM, [facility] 07/24/2015 8:42 AM

## 2015-07-24 NOTE — Discharge Instructions (Signed)

## 2015-07-24 NOTE — Lactation Note (Signed)
This note was copied from a baby's chart. Lactation Consultation Note: Mother has infant latched on when I arrived for breastfeeding consult. Infant is tugging with strong rhythmic suckling . Mother states that infant keeps rolling bottom lip in. She is adjusted to roll lip down for wider gape with feedings. Mother denies having any breastfeeding concerns. Reviewed treatment for severe engorgement. Mother reminded to cue base feed at least 8-12 times in 24 hours. Mother is aware of available Lactation service and community support.   Patient Name: Boy Brelee Garard [id number] Date: 07/24/2015 Reason for consult: Follow-up assessment   Maternal Data    Feeding Feeding Type: Breast Fed  LATCH Score/Interventions Latch: Grasps breast easily, tongue down, lips flanged, rhythmical sucking.  Audible Swallowing: Spontaneous and intermittent  Type of Nipple: Everted at rest and after stimulation  Comfort (Breast/Nipple): Filling, red/small blisters or bruises, mild/mod discomfort  Problem noted: Filling Interventions (Filling): Frequent nursing;Hand pump  Hold (Positioning): No assistance needed to correctly position infant at breast.  LATCH Score: 9  Lactation Tools Discussed/Used     Consult Status Consult Status: Complete    Darla Lesches 07/24/2015, 10:21 AM

## 2015-07-26 ENCOUNTER — Encounter: Payer: Medicaid Other | Admitting: Obstetrics & Gynecology

## 2015-09-02 ENCOUNTER — Encounter: Payer: Self-pay | Admitting: Family

## 2015-09-02 ENCOUNTER — Ambulatory Visit (INDEPENDENT_AMBULATORY_CARE_PROVIDER_SITE_OTHER): Payer: Medicaid Other | Admitting: Family

## 2015-09-02 DIAGNOSIS — Z30011 Encounter for initial prescription of contraceptive pills: Secondary | ICD-10-CM

## 2015-09-02 MED ORDER — NORETHINDRONE 0.35 MG PO TABS: 1.0000 | ORAL_TABLET | Freq: Every day | ORAL | Status: DC

## 2015-09-02 NOTE — Progress Notes (Signed)
Patient ID: Megan Powell, female   DOB: 08/09/1990, 25 y.o.   MRN: [id number] [place] Partum Exam  Megan Powell is a 25 y.o. G39P2002 female who presents for a postpartum visit. She is 6 weeks postpartum following a spontaneous vaginal delivery. I have fully reviewed the prenatal and intrapartum course. The delivery was at 40 gestational weeks.  Anesthesia: epidural. Postpartum course has been unremarkable. Baby's course has been unremarkable. Baby is feeding by breast. Bleeding no bleeding. Bowel function is normal. Bladder function is normal. Patient is not sexually active. Contraception method is none. Postpartum depression screening: negative.  The following portions of the patient's history were reviewed and updated as appropriate: allergies, current medications, past family history, past medical history, past social history, past surgical history and problem list.  Review of Systems Pertinent items are noted in HPI.   Objective:    BP 116/78 mmHg  Pulse 78  Resp 16  Ht 5\' 5"  (1.651 m)  Wt 211 lb (95.709 kg)  BMI 35.11 kg/m2  Breastfeeding? Yes  General:  alert, cooperative and appears stated age   Breasts:  inspection negative, no nipple discharge or bleeding, no masses or nodularity palpable  Lungs: clear to auscultation bilaterally  Heart:  regular rate and rhythm, S1, S2 normal, no murmur, click, rub or gallop  Abdomen: soft, non-tender; bowel sounds normal; no masses,  no organomegaly  Pelvic exam not indicated - not bleeding, no pain at vaginal area      Assessment:    Normal postpartum exam. Pap smear not done at today's visit.   Plan:    1. Contraception: oral progesterone-only contraceptive.  Desires to use for one month and then will switch to COC. 2. Encouraged to come in for pap smear with co-testing to be able to do every 3 years.   3. Follow up as needed.   Venia Carbon Michiel Cowboy, CNM

## 2015-11-16 ENCOUNTER — Telehealth: Payer: Self-pay | Admitting: *Deleted

## 2015-11-16 ENCOUNTER — Encounter: Payer: Self-pay | Admitting: Obstetrics & Gynecology

## 2015-11-16 DIAGNOSIS — N61 Mastitis without abscess: Secondary | ICD-10-CM

## 2015-11-16 MED ORDER — DICLOXACILLIN SODIUM 250 MG PO CAPS: 250.0000 mg | ORAL_CAPSULE | Freq: Four times a day (QID) | ORAL | 0 refills | Status: DC

## 2015-11-16 NOTE — Telephone Encounter (Signed)
Pt called with c/o's breast swollen red and running a fever.  She is breast feeding.  She is not allergic to anything.  Per Dr Hulan Fray send in [place] for Dicloxicillin to [place]

## 2016-01-05 ENCOUNTER — Encounter: Payer: Self-pay | Admitting: Obstetrics & Gynecology

## 2016-01-05 ENCOUNTER — Telehealth: Payer: Self-pay | Admitting: *Deleted

## 2016-01-05 NOTE — Telephone Encounter (Signed)
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

## 2016-01-09 ENCOUNTER — Encounter: Payer: Self-pay | Admitting: *Deleted

## 2016-02-12 ENCOUNTER — Encounter: Payer: Self-pay | Admitting: Obstetrics & Gynecology

## 2016-02-27 ENCOUNTER — Encounter: Payer: Self-pay | Admitting: Obstetrics & Gynecology

## 2016-08-14 ENCOUNTER — Encounter: Payer: Self-pay | Admitting: Obstetrics & Gynecology

## 2016-08-14 ENCOUNTER — Ambulatory Visit (INDEPENDENT_AMBULATORY_CARE_PROVIDER_SITE_OTHER): Payer: Medicaid Other | Admitting: Obstetrics & Gynecology

## 2016-08-14 ENCOUNTER — Other Ambulatory Visit (HOSPITAL_COMMUNITY)
Admission: RE | Admit: 2016-08-14 | Discharge: 2016-08-14 | Disposition: A | Discharge status: Home / Self Care | Payer: Medicaid Other | Source: Ambulatory Visit | Attending: Obstetrics & Gynecology | Admitting: Obstetrics & Gynecology

## 2016-08-14 DIAGNOSIS — Z3689 Encounter for other specified antenatal screening: Secondary | ICD-10-CM | POA: Diagnosis not present

## 2016-08-14 DIAGNOSIS — O09891 Supervision of other high risk pregnancies, first trimester: Secondary | ICD-10-CM | POA: Insufficient documentation

## 2016-08-14 DIAGNOSIS — Z3A01 Less than 8 weeks gestation of pregnancy: Secondary | ICD-10-CM | POA: Insufficient documentation

## 2016-08-14 DIAGNOSIS — Z3481 Encounter for supervision of other normal pregnancy, first trimester: Secondary | ICD-10-CM | POA: Diagnosis present

## 2016-08-14 DIAGNOSIS — Z3480 Encounter for supervision of other normal pregnancy, unspecified trimester: Secondary | ICD-10-CM

## 2016-08-14 NOTE — Progress Notes (Signed)
BABDATING AND VIABILITY SONOGRAM   Megan Powell is a 26 y.o. year old G7P2002 with LMP Patient's last menstrual period was 06/24/2016. which would correlate to  [redacted]w[redacted]d weeks gestation.  She has regular menstrual cycles.   She is here today for a confirmatory initial sonogram.    GESTATION: SINGLETON gestation     FETAL ACTIVITY:          Heart rate       150          The fetus is active.  WORKING EDD( early ultrasound ): 03-31-17    [name] RN

## 2016-08-14 NOTE — Progress Notes (Signed)
  Subjective:    Megan Powell is a MW  [id number] (2 sons)  [redacted]w[redacted]d being seen today for her first obstetrical visit.  Her obstetrical history is significant for none. Patient does intend to breast feed. Pregnancy history fully reviewed.  Patient reports no complaints.  Vitals:   08/14/16 1438  BP: (!) 146/81  Pulse: 87  Weight: 181 lb (82.1 kg)    HISTORY: OB History  Gravida Para Term Preterm AB Living  3 2 2     2   SAB TAB Ectopic Multiple Live Births        0 2    # Outcome Date GA Lbr Len/2nd Weight Sex Delivery Anes PTL Lv  3 Current           2 Term 07/22/15 [redacted]w[redacted]d / 00:11 8 lb 11.9 oz (3.966 kg) M Vag-Spont EPI  LIV  1 Term 11/19/13 [redacted]w[redacted]d 07:56 / 00:59 8 lb (3.629 kg) M Vag-Spont EPI  LIV     Complications: Gestational hypertension     Past Medical History:  Diagnosis Date  . Hypertension affecting pregnancy    Past Surgical History:  Procedure Laterality Date  . NO PAST SURGERIES     Family History  Problem Relation Age of Onset  . Hypertension Father      Exam    Uterus:     Pelvic Exam:    Perineum: No Hemorrhoids   Vulva: normal   Vagina:  normal mucosa   pH:    Cervix: anteverted, friable with pap smear   Adnexa: normal adnexa   Bony Pelvis: android  System: Breast:  normal appearance, no masses or tenderness   Skin: normal coloration and turgor, no rashes    Neurologic: oriented   Extremities: normal strength, tone, and muscle mass   HEENT PERRLA   Mouth/Teeth mucous membranes moist, pharynx normal without lesions   Neck supple   Cardiovascular: regular rate and rhythm   Respiratory:  appears well, vitals normal, no respiratory distress, acyanotic, normal RR, ear and throat exam is normal, neck free of mass or lymphadenopathy, chest clear, no wheezing, crepitations, rhonchi, normal symmetric air entry   Abdomen: soft, non-tender; bowel sounds normal; no masses,  no organomegaly   Urinary: urethral meatus normal  She has a 6 cm lipoma on her  right chest wall.   Assessment:    Pregnancy: [id number] Patient Active Problem List   Diagnosis Date Noted  . Short interval between pregnancies affecting pregnancy in first trimester, antepartum 08/14/2016  . Biological false positive RPR test 05/10/2015  . History of gestational hypertension 12/06/2014  . Supervision of other normal pregnancy, antepartum 12/06/2014        Plan:     Initial labs drawn. Prenatal vitamins. Problem list reviewed and updated. Genetic Screening discussed requests NIPS  Ultrasound discussed; fetal survey: requested.  Follow up in 5 weeks. She would like to do [employer] again.  Emily Filbert 08/14/2016

## 2016-08-15 ENCOUNTER — Telehealth: Payer: Self-pay | Admitting: *Deleted

## 2016-08-15 ENCOUNTER — Other Ambulatory Visit: Payer: Self-pay | Admitting: *Deleted

## 2016-08-15 DIAGNOSIS — Z3480 Encounter for supervision of other normal pregnancy, unspecified trimester: Secondary | ICD-10-CM

## 2016-08-15 DIAGNOSIS — Z3481 Encounter for supervision of other normal pregnancy, first trimester: Secondary | ICD-10-CM

## 2016-08-15 LAB — OBSTETRIC PANEL
ANTIBODY SCREEN: NEGATIVE
BASOS PCT: 0 %
Basophils Absolute: 0 cells/uL (ref 0–200)
EOS ABS: 97 {cells}/uL (ref 15–500)
Eosinophils Relative: 1 %
HCT: 40.6 % (ref 35.0–45.0)
HEP B S AG: NEGATIVE
Hemoglobin: 13.2 g/dL (ref 11.7–15.5)
LYMPHS ABS: 2522 {cells}/uL (ref 850–3900)
LYMPHS PCT: 26 %
MCH: 29.2 pg (ref 27.0–33.0)
MCHC: 32.5 g/dL (ref 32.0–36.0)
MCV: 89.8 fL (ref 80.0–100.0)
MONO ABS: 776 {cells}/uL (ref 200–950)
MPV: 10.2 fL (ref 7.5–12.5)
Monocytes Relative: 8 %
NEUTROS PCT: 65 %
Neutro Abs: 6305 cells/uL (ref 1500–7800)
PLATELETS: 261 10*3/uL (ref 140–400)
RBC: 4.52 MIL/uL (ref 3.80–5.10)
RDW: 14.7 % (ref 11.0–15.0)
RH TYPE: NEGATIVE
RUBELLA: 1.34 {index} — AB (ref <0.90)
WBC: 9.7 10*3/uL (ref 3.8–10.8)

## 2016-08-15 LAB — URINE CULTURE: ORGANISM ID, BACTERIA: NO GROWTH

## 2016-08-15 MED ORDER — VITAFOL ULTRA 29-0.6-0.4-200 MG PO CAPS: 1.0000 | ORAL_CAPSULE | Freq: Every day | ORAL | 12 refills | Status: DC

## 2016-08-15 NOTE — Telephone Encounter (Signed)
Pt called requesting PNV and wants to schedule 1st Trimester screening with MFM.  Appt made and pt notified.

## 2016-08-17 LAB — CYTOLOGY - PAP
Adequacy: ABSENT
CHLAMYDIA, DNA PROBE: NEGATIVE
Diagnosis: NEGATIVE
Neisseria Gonorrhea: NEGATIVE

## 2016-09-18 ENCOUNTER — Ambulatory Visit (INDEPENDENT_AMBULATORY_CARE_PROVIDER_SITE_OTHER): Payer: Medicaid Other | Admitting: Obstetrics & Gynecology

## 2016-09-18 VITALS — BP 131/77 | HR 83 | Wt 181.0 lb

## 2016-09-18 DIAGNOSIS — Z8759 Personal history of other complications of pregnancy, childbirth and the puerperium: Secondary | ICD-10-CM

## 2016-09-18 DIAGNOSIS — Z3480 Encounter for supervision of other normal pregnancy, unspecified trimester: Secondary | ICD-10-CM

## 2016-09-18 DIAGNOSIS — Z3689 Encounter for other specified antenatal screening: Secondary | ICD-10-CM

## 2016-09-18 DIAGNOSIS — Z3482 Encounter for supervision of other normal pregnancy, second trimester: Secondary | ICD-10-CM

## 2016-09-18 MED ORDER — ASPIRIN EC 81 MG PO TBEC: 81.0000 mg | DELAYED_RELEASE_TABLET | Freq: Every day | ORAL | 2 refills | Status: DC

## 2016-09-18 NOTE — Progress Notes (Signed)
   PRENATAL VISIT NOTE  Subjective:  Megan Powell is a 26 y.o. G3P2002 at [redacted]w[redacted]d being seen today for ongoing prenatal care.  She is currently monitored for the following issues for this low-risk pregnancy and has History of gestational hypertension; Supervision of other normal pregnancy, antepartum; and Short interval between pregnancies affecting pregnancy in first trimester, antepartum on her problem list.  Patient reports no complaints.   . Vag. Bleeding: None.   . Denies leaking of fluid.   The following portions of the patient's history were reviewed and updated as appropriate: allergies, current medications, past family history, past medical history, past social history, past surgical history and problem list. Problem list updated.  Objective:   Vitals:   09/18/16 1417  BP: 131/77  Pulse: 83  Weight: 181 lb (82.1 kg)    Fetal Status: Fetal Heart Rate (bpm): 161         General:  Alert, oriented and cooperative. Patient is in no acute distress.  Skin: Skin is warm and dry. No rash noted.   Cardiovascular: Normal heart rate noted  Respiratory: Normal respiratory effort, no problems with respiration noted  Abdomen: Soft, gravid, appropriate for gestational age.  Pain/Pressure: Absent     Pelvic: Cervical exam deferred        Extremities: Normal range of motion.  Edema: None  Mental Status:  Normal mood and affect. Normal behavior. Normal judgment and thought content.   Assessment and Plan:  Pregnancy: G3P2002 at [redacted]w[redacted]d  1. History of gestational hypertension Aspirin ordered for preeclampsia prevention - aspirin EC 81 MG tablet; Take 1 tablet (81 mg total) by mouth daily. Take after 12 weeks for prevention of preeclampsia later in pregnancy  Dispense: 300 tablet; Refill: 2  2. Encounter for fetal anatomic survey Anatomy scan ordered - US MFM OB COMP + 14 WK; Future  3. Supervision of other normal pregnancy, antepartum Panorama done today. Scheduled for NT scan. - Genetic  Screening - US MFM OB COMP + 14 WK; Future Doing well with [facility] program. No other complaints or concerns.  Routine obstetric precautions reviewed. Please refer to After Visit Summary for other counseling recommendations.  Return in about 8 weeks (around 11/13/2016) for OB 20 week visit ([facility]).   Verita Schneiders, MD

## 2016-09-18 NOTE — Patient Instructions (Signed)

## 2016-09-18 NOTE — Addendum Note (Signed)
Addended by: Asencion Islam on: 09/18/2016 03:06 PM   Modules accepted: Orders

## 2016-09-19 ENCOUNTER — Encounter (HOSPITAL_COMMUNITY): Payer: Self-pay

## 2016-09-19 ENCOUNTER — Ambulatory Visit (HOSPITAL_COMMUNITY): Admission: RE | Admit: 2016-09-19 | Payer: Medicaid Other | Source: Ambulatory Visit

## 2016-09-19 ENCOUNTER — Ambulatory Visit (HOSPITAL_COMMUNITY)
Admission: RE | Admit: 2016-09-19 | Discharge: 2016-09-19 | Disposition: A | Discharge status: Home / Self Care | Payer: Medicaid Other | Source: Ambulatory Visit | Attending: Obstetrics & Gynecology | Admitting: Obstetrics & Gynecology

## 2016-09-19 DIAGNOSIS — Z3682 Encounter for antenatal screening for nuchal translucency: Secondary | ICD-10-CM | POA: Diagnosis not present

## 2016-09-19 DIAGNOSIS — Z3481 Encounter for supervision of other normal pregnancy, first trimester: Secondary | ICD-10-CM

## 2016-09-19 DIAGNOSIS — O99211 Obesity complicating pregnancy, first trimester: Secondary | ICD-10-CM | POA: Insufficient documentation

## 2016-09-19 DIAGNOSIS — Z3A12 12 weeks gestation of pregnancy: Secondary | ICD-10-CM | POA: Insufficient documentation

## 2016-09-19 DIAGNOSIS — O09291 Supervision of pregnancy with other poor reproductive or obstetric history, first trimester: Secondary | ICD-10-CM | POA: Diagnosis not present

## 2016-09-20 LAB — QUANTIFERON TB GOLD ASSAY (BLOOD)
INTERFERON GAMMA RELEASE ASSAY: NEGATIVE
MITOGEN-NIL SO: 7.75 [IU]/mL
QUANTIFERON NIL VALUE: 0.38 [IU]/mL
Quantiferon Tb Ag Minus Nil Value: <0 IU/mL

## 2016-09-25 ENCOUNTER — Encounter: Payer: Self-pay | Admitting: *Deleted

## 2016-09-25 DIAGNOSIS — Z3480 Encounter for supervision of other normal pregnancy, unspecified trimester: Secondary | ICD-10-CM

## 2016-11-05 ENCOUNTER — Ambulatory Visit (HOSPITAL_COMMUNITY)
Admission: RE | Admit: 2016-11-05 | Discharge: 2016-11-05 | Disposition: A | Discharge status: Home / Self Care | Payer: Medicaid Other | Source: Ambulatory Visit | Attending: Obstetrics & Gynecology | Admitting: Obstetrics & Gynecology

## 2016-11-05 ENCOUNTER — Ambulatory Visit (HOSPITAL_COMMUNITY): Payer: Medicaid Other

## 2016-11-05 DIAGNOSIS — Z3480 Encounter for supervision of other normal pregnancy, unspecified trimester: Secondary | ICD-10-CM

## 2016-11-05 DIAGNOSIS — Z3689 Encounter for other specified antenatal screening: Secondary | ICD-10-CM | POA: Diagnosis present

## 2016-11-05 DIAGNOSIS — Z3A19 19 weeks gestation of pregnancy: Secondary | ICD-10-CM | POA: Insufficient documentation

## 2016-11-05 DIAGNOSIS — O321XX0 Maternal care for breech presentation, not applicable or unspecified: Secondary | ICD-10-CM | POA: Diagnosis not present

## 2016-11-08 ENCOUNTER — Encounter: Payer: Self-pay | Admitting: Obstetrics & Gynecology

## 2016-11-15 ENCOUNTER — Ambulatory Visit (INDEPENDENT_AMBULATORY_CARE_PROVIDER_SITE_OTHER): Payer: Medicaid Other | Admitting: Obstetrics & Gynecology

## 2016-11-15 VITALS — BP 128/68 | HR 89 | Wt 188.0 lb

## 2016-11-15 DIAGNOSIS — Z3493 Encounter for supervision of normal pregnancy, unspecified, third trimester: Secondary | ICD-10-CM

## 2016-11-15 DIAGNOSIS — Z8759 Personal history of other complications of pregnancy, childbirth and the puerperium: Secondary | ICD-10-CM

## 2016-11-15 DIAGNOSIS — Z3482 Encounter for supervision of other normal pregnancy, second trimester: Secondary | ICD-10-CM

## 2016-11-15 DIAGNOSIS — Z3480 Encounter for supervision of other normal pregnancy, unspecified trimester: Secondary | ICD-10-CM

## 2016-11-15 NOTE — Progress Notes (Signed)
   PRENATAL VISIT NOTE  Subjective:  Megan Powell is a 26 y.o. G3P2002 at [redacted]w[redacted]d being seen today for ongoing prenatal care.  She is currently monitored for the following issues for this low-risk pregnancy and has History of gestational hypertension; Supervision of other normal pregnancy, antepartum; Rh negative status during pregnancy; and Short interval between pregnancies affecting pregnancy in first trimester, antepartum on her problem list.  Patient reports no complaints.  Contractions: Not present. Vag. Bleeding: None.   . Denies leaking of fluid.   The following portions of the patient's history were reviewed and updated as appropriate: allergies, current medications, past family history, past medical history, past social history, past surgical history and problem list. Problem list updated.  Objective:   Vitals:   11/15/16 1449  BP: 128/68  Pulse: 89  Weight: 188 lb (85.3 kg)    Fetal Status: Fetal Heart Rate (bpm): 148         General:  Alert, oriented and cooperative. Patient is in no acute distress.  Skin: Skin is warm and dry. No rash noted.   Cardiovascular: Normal heart rate noted  Respiratory: Normal respiratory effort, no problems with respiration noted  Abdomen: Soft, gravid, appropriate for gestational age.  Pain/Pressure: Absent     Pelvic: Cervical exam deferred        Extremities: Normal range of motion.  Edema: None  Mental Status:  Normal mood and affect. Normal behavior. Normal judgment and thought content.   Assessment and Plan:  Pregnancy: G3P2002 at [redacted]w[redacted]d  1. Encounter for supervision of normal pregnancy in third trimester, unspecified gravidity  - Alpha fetoprotein, maternal  2. Supervision of other normal pregnancy, antepartum - 2 hour GTT at next visit - flu vaccine today  3. History of gestational hypertension - with her first pregnancy, 2nd one with no problems  Preterm labor symptoms and general obstetric precautions including but not  limited to vaginal bleeding, contractions, leaking of fluid and fetal movement were reviewed in detail with the patient. Please refer to After Visit Summary for other counseling recommendations.  No Follow-up on file.   Emily Filbert, MD

## 2016-11-21 LAB — ALPHA FETOPROTEIN, MATERNAL
AFP MOM: 0.61
AFP, SERUM: 32 ng/mL
CALC'D GESTATIONAL AGE: 20.6 wk
Maternal Wt: 188 [lb_av]
Risk for ONTD: <1
Twins-AFP: 1

## 2016-11-21 LAB — TIQ-AOE

## 2017-01-07 ENCOUNTER — Ambulatory Visit (INDEPENDENT_AMBULATORY_CARE_PROVIDER_SITE_OTHER): Payer: Medicaid Other | Admitting: Advanced Practice Midwife

## 2017-01-07 VITALS — BP 117/68 | HR 84 | Wt 199.0 lb

## 2017-01-07 DIAGNOSIS — Z6791 Unspecified blood type, Rh negative: Secondary | ICD-10-CM

## 2017-01-07 DIAGNOSIS — Z0489 Encounter for examination and observation for other specified reasons: Secondary | ICD-10-CM

## 2017-01-07 DIAGNOSIS — O09893 Supervision of other high risk pregnancies, third trimester: Secondary | ICD-10-CM

## 2017-01-07 DIAGNOSIS — O26893 Other specified pregnancy related conditions, third trimester: Secondary | ICD-10-CM

## 2017-01-07 DIAGNOSIS — Z3480 Encounter for supervision of other normal pregnancy, unspecified trimester: Secondary | ICD-10-CM

## 2017-01-07 DIAGNOSIS — Z3490 Encounter for supervision of normal pregnancy, unspecified, unspecified trimester: Secondary | ICD-10-CM

## 2017-01-07 DIAGNOSIS — IMO0002 Reserved for concepts with insufficient information to code with codable children: Secondary | ICD-10-CM

## 2017-01-07 DIAGNOSIS — Z3A29 29 weeks gestation of pregnancy: Secondary | ICD-10-CM

## 2017-01-07 DIAGNOSIS — Z8759 Personal history of other complications of pregnancy, childbirth and the puerperium: Secondary | ICD-10-CM

## 2017-01-07 DIAGNOSIS — O09891 Supervision of other high risk pregnancies, first trimester: Secondary | ICD-10-CM

## 2017-01-07 MED ORDER — RHO D IMMUNE GLOBULIN 1500 UNIT/2ML IJ SOSY
300.0000 ug | PREFILLED_SYRINGE | Freq: Once | INTRAMUSCULAR | Status: AC
  Administered 2017-01-07: 300 ug via INTRAMUSCULAR

## 2017-01-07 NOTE — Patient Instructions (Signed)

## 2017-01-07 NOTE — Progress Notes (Signed)
   PRENATAL VISIT NOTE  Subjective:  Megan Powell is a 26 y.o. G3P2002 at [redacted]w[redacted]d being seen today for ongoing prenatal care.  She is currently monitored for the following issues for this low-risk pregnancy and has History of gestational hypertension; Supervision of other normal pregnancy, antepartum; Rh negative status during pregnancy; and Short interval between pregnancies affecting pregnancy in first trimester, antepartum on their problem list.  Patient reports no complaints.  Contractions: Not present. Vag. Bleeding: None.  Movement: Present. Denies leaking of fluid.   The following portions of the patient's history were reviewed and updated as appropriate: allergies, current medications, past family history, past medical history, past social history, past surgical history and problem list. Problem list updated.  Objective:   Vitals:   01/07/17 0801  BP: 117/68  Pulse: 84  Weight: 199 lb (90.3 kg)    Fetal Status: Fetal Heart Rate (bpm): 143 Fundal Height: 31 cm Movement: Present  Presentation: Vertex  General:  Alert, oriented and cooperative. Patient is in no acute distress.  Skin: Skin is warm and dry. No rash noted.   Cardiovascular: Normal heart rate noted  Respiratory: Normal respiratory effort, no problems with respiration noted  Abdomen: Soft, gravid, appropriate for gestational age.  Pain/Pressure: Absent     Pelvic: Cervical exam deferred        Extremities: Normal range of motion.  Edema: None  Mental Status:  Normal mood and affect. Normal behavior. Normal judgment and thought content.   Assessment and Plan:  Pregnancy: G3P2002 at [redacted]w[redacted]d  1. Encounter for supervision of normal pregnancy, antepartum, unspecified gravidity  - HIV antibody (with reflex) - CBC - RPR - 2Hr GTT w/ 1 Hr Carpenter 75 g - Tdap vaccine greater than or equal to 7yo IM - Antibody screen; Future - Antibody screen  2. Supervision of other normal pregnancy, antepartum   3. Evaluate  anatomy not seen on prior sonogram  - [place] MFM OB FOLLOW UP; Future  4. [redacted] weeks gestation of pregnancy  - [place] MFM OB FOLLOW UP; Future  5. Short interval between pregnancies affecting pregnancy in first trimester, antepartum   6. Rh negative status during pregnancy in third trimester   7. History of gestational hypertension - Nml BP so far  Preterm labor symptoms and general obstetric precautions including but not limited to vaginal bleeding, contractions, leaking of fluid and fetal movement were reviewed in detail with the patient. Please refer to After Visit Summary for other counseling recommendations.  Return in about 4 weeks (around 02/04/2017) for ROB.   Manya Silvas, CNM

## 2017-01-08 ENCOUNTER — Telehealth: Payer: Self-pay

## 2017-01-08 LAB — 2HR GTT W 1 HR, CARPENTER, 75 G
GLUCOSE, FASTING, GEST: 75 mg/dL (ref 65–91)
Glucose, 1 Hr, Gest: 112 mg/dL (ref 65–179)
Glucose, 2 Hr, Gest: 102 mg/dL (ref 65–152)

## 2017-01-08 LAB — CBC
HCT: 35.6 % (ref 35.0–45.0)
HEMOGLOBIN: 11.9 g/dL (ref 11.7–15.5)
MCH: 29.6 pg (ref 27.0–33.0)
MCHC: 33.4 g/dL (ref 32.0–36.0)
MCV: 88.6 fL (ref 80.0–100.0)
MPV: 11.3 fL (ref 7.5–12.5)
Platelets: 224 10*3/uL (ref 140–400)
RBC: 4.02 10*6/uL (ref 3.80–5.10)
RDW: 13 % (ref 11.0–15.0)
WBC: 10 10*3/uL (ref 3.8–10.8)

## 2017-01-08 LAB — HIV ANTIBODY (ROUTINE TESTING W REFLEX): HIV 1&2 Ab, 4th Generation: NONREACTIVE

## 2017-01-08 LAB — RPR: RPR: NONREACTIVE

## 2017-01-08 LAB — ANTIBODY SCREEN: Antibody Screen: NOT DETECTED

## 2017-01-08 NOTE — Telephone Encounter (Signed)
Spoke with pt and she is aware that she passed her 2 hour GTT

## 2017-01-18 ENCOUNTER — Ambulatory Visit (HOSPITAL_COMMUNITY)
Admission: RE | Admit: 2017-01-18 | Discharge: 2017-01-18 | Disposition: A | Discharge status: Home / Self Care | Payer: Medicaid Other | Source: Ambulatory Visit | Attending: Advanced Practice Midwife | Admitting: Advanced Practice Midwife

## 2017-01-18 ENCOUNTER — Other Ambulatory Visit: Payer: Self-pay | Admitting: Advanced Practice Midwife

## 2017-01-18 DIAGNOSIS — Z362 Encounter for other antenatal screening follow-up: Secondary | ICD-10-CM | POA: Insufficient documentation

## 2017-01-18 DIAGNOSIS — O09893 Supervision of other high risk pregnancies, third trimester: Secondary | ICD-10-CM

## 2017-01-18 DIAGNOSIS — IMO0002 Reserved for concepts with insufficient information to code with codable children: Secondary | ICD-10-CM

## 2017-01-18 DIAGNOSIS — O09293 Supervision of pregnancy with other poor reproductive or obstetric history, third trimester: Secondary | ICD-10-CM | POA: Insufficient documentation

## 2017-01-18 DIAGNOSIS — Z0489 Encounter for examination and observation for other specified reasons: Secondary | ICD-10-CM

## 2017-01-18 DIAGNOSIS — Z3A29 29 weeks gestation of pregnancy: Secondary | ICD-10-CM | POA: Insufficient documentation

## 2017-01-18 DIAGNOSIS — O99213 Obesity complicating pregnancy, third trimester: Secondary | ICD-10-CM | POA: Insufficient documentation

## 2017-02-07 ENCOUNTER — Ambulatory Visit (INDEPENDENT_AMBULATORY_CARE_PROVIDER_SITE_OTHER): Payer: Medicaid Other | Admitting: Obstetrics and Gynecology

## 2017-02-07 ENCOUNTER — Encounter: Payer: Self-pay | Admitting: Obstetrics and Gynecology

## 2017-02-07 VITALS — BP 126/74 | HR 90 | Wt 209.0 lb

## 2017-02-07 DIAGNOSIS — Z8759 Personal history of other complications of pregnancy, childbirth and the puerperium: Secondary | ICD-10-CM

## 2017-02-07 DIAGNOSIS — O26893 Other specified pregnancy related conditions, third trimester: Secondary | ICD-10-CM

## 2017-02-07 DIAGNOSIS — Z6791 Unspecified blood type, Rh negative: Secondary | ICD-10-CM

## 2017-02-07 DIAGNOSIS — Z3480 Encounter for supervision of other normal pregnancy, unspecified trimester: Secondary | ICD-10-CM

## 2017-02-07 DIAGNOSIS — O09893 Supervision of other high risk pregnancies, third trimester: Secondary | ICD-10-CM

## 2017-02-07 MED ORDER — PANTOPRAZOLE SODIUM 20 MG PO TBEC: 20.0000 mg | DELAYED_RELEASE_TABLET | Freq: Every day | ORAL | 3 refills | Status: DC

## 2017-02-07 NOTE — Progress Notes (Signed)
   PRENATAL VISIT NOTE  Subjective:  Megan Powell is a 26 y.o. G3P2002 at [redacted]w[redacted]d being seen today for ongoing prenatal care.  She is currently monitored for the following issues for this low-risk pregnancy and has History of gestational hypertension; Supervision of other normal pregnancy, antepartum; Rh negative status during pregnancy; and Short interval between pregnancies affecting pregnancy in first trimester, antepartum on their problem list.  Patient reports heartburn.  Contractions: Not present. Vag. Bleeding: None.  Movement: Present. Denies leaking of fluid.   The following portions of the patient's history were reviewed and updated as appropriate: allergies, current medications, past family history, past medical history, past social history, past surgical history and problem list. Problem list updated.  Objective:   Vitals:   02/07/17 1437  BP: 126/74  Pulse: 90  Weight: 209 lb (94.8 kg)    Fetal Status: Fetal Heart Rate (bpm): 145 Fundal Height: 33 cm Movement: Present     General:  Alert, oriented and cooperative. Patient is in no acute distress.  Skin: Skin is warm and dry. No rash noted.   Cardiovascular: Normal heart rate noted  Respiratory: Normal respiratory effort, no problems with respiration noted  Abdomen: Soft, gravid, appropriate for gestational age.  Pain/Pressure: Absent     Pelvic: Cervical exam deferred        Extremities: Normal range of motion.  Edema: None  Mental Status:  Normal mood and affect. Normal behavior. Normal judgment and thought content.   Assessment and Plan:  Pregnancy: G3P2002 at [redacted]w[redacted]d  1. Supervision of other normal pregnancy, antepartum Patient reports some heartburn in the evening no longer relieved with tums Rx protonix provided  2. Rh negative status during pregnancy in third trimester S/p rhogam  3. History of gestational hypertension Continue ASA Normotensive  Preterm labor symptoms and general obstetric precautions  including but not limited to vaginal bleeding, contractions, leaking of fluid and fetal movement were reviewed in detail with the patient. Please refer to After Visit Summary for other counseling recommendations.  Return in about 4 weeks (around 03/07/2017) for ROB.   Mora Bellman, MD

## 2017-02-08 ENCOUNTER — Encounter: Payer: Medicaid Other | Admitting: Obstetrics & Gynecology

## 2017-02-25 ENCOUNTER — Encounter: Payer: Self-pay | Admitting: Obstetrics & Gynecology

## 2017-02-26 NOTE — L&D Delivery Note (Signed)
Delivery Note At 4:32 PM a viable female was delivered via  (Presentation:vertex ;LOA  ).  APGAR: 9,9; weight  pending Placenta status:delivered intact.  Cord:3V loose nuchal easily reduced  with the following complications:none  .  Cord pH: NA  Anesthesia:  epidural Episiotomy:  none Lacerations:  none Suture Repair: N/A Est. Blood Loss (mL): 75   Mom to postpartum.  Baby to Couplet care / Skin to Skin.  Megan Powell 03/29/2017, 4:52 PM

## 2017-03-07 ENCOUNTER — Ambulatory Visit (INDEPENDENT_AMBULATORY_CARE_PROVIDER_SITE_OTHER): Payer: Medicaid Other | Admitting: Obstetrics & Gynecology

## 2017-03-07 ENCOUNTER — Other Ambulatory Visit (HOSPITAL_COMMUNITY)
Admission: RE | Admit: 2017-03-07 | Discharge: 2017-03-07 | Disposition: A | Discharge status: Home / Self Care | Payer: Medicaid Other | Source: Ambulatory Visit | Attending: Obstetrics & Gynecology | Admitting: Obstetrics & Gynecology

## 2017-03-07 VITALS — BP 133/84 | HR 105 | Wt 213.0 lb

## 2017-03-07 DIAGNOSIS — Z3480 Encounter for supervision of other normal pregnancy, unspecified trimester: Secondary | ICD-10-CM

## 2017-03-07 DIAGNOSIS — Z3483 Encounter for supervision of other normal pregnancy, third trimester: Secondary | ICD-10-CM | POA: Diagnosis present

## 2017-03-07 DIAGNOSIS — O09891 Supervision of other high risk pregnancies, first trimester: Secondary | ICD-10-CM

## 2017-03-07 LAB — OB RESULTS CONSOLE GC/CHLAMYDIA: Gonorrhea: NEGATIVE

## 2017-03-07 LAB — OB RESULTS CONSOLE GBS: GBS: NEGATIVE

## 2017-03-07 NOTE — Progress Notes (Signed)
   PRENATAL VISIT NOTE  Subjective:  Megan Powell is a 27 y.o. G3P2002 at [redacted]w[redacted]d being seen today for ongoing prenatal care.  She is currently monitored for the following issues for this low-risk pregnancy and has History of gestational hypertension; Supervision of other normal pregnancy, antepartum; Rh negative status during pregnancy; and Short interval between pregnancies affecting pregnancy in first trimester, antepartum on their problem list.  Patient reports no complaints.  Contractions: Irritability. Vag. Bleeding: None.  Movement: Present. Denies leaking of fluid.   The following portions of the patient's history were reviewed and updated as appropriate: allergies, current medications, past family history, past medical history, past social history, past surgical history and problem list. Problem list updated.  Objective:   Vitals:   03/07/17 1545  BP: 133/84  Pulse: (!) 105  Weight: 213 lb (96.6 kg)    Fetal Status: Fetal Heart Rate (bpm): 154   Movement: Present     General:  Alert, oriented and cooperative. Patient is in no acute distress.  Skin: Skin is warm and dry. No rash noted.   Cardiovascular: Normal heart rate noted  Respiratory: Normal respiratory effort, no problems with respiration noted  Abdomen: Soft, gravid, appropriate for gestational age.  Pain/Pressure: Present     Pelvic: Cervical exam deferred        Extremities: Normal range of motion.  Edema: None  Mental Status:  Normal mood and affect. Normal behavior. Normal judgment and thought content.   Assessment and Plan:  Pregnancy: G3P2002 at [redacted]w[redacted]d  1. Encounter for supervision of other normal pregnancy in third trimester  - GC/Chlamydia probe amp ([place])not at [facility] - Culture, beta strep (group b only)  2. Supervision of other normal pregnancy, antepartum   3. Short interval between pregnancies affecting pregnancy in first trimester, antepartum   Preterm labor symptoms and general obstetric  precautions including but not limited to vaginal bleeding, contractions, leaking of fluid and fetal movement were reviewed in detail with the patient. Please refer to After Visit Summary for other counseling recommendations.  Return in about 1 day (around 03/08/2017).   Emily Filbert, MD

## 2017-03-09 LAB — GC/CHLAMYDIA PROBE AMP (~~LOC~~) NOT AT ARMC
CHLAMYDIA, DNA PROBE: NEGATIVE
Neisseria Gonorrhea: NEGATIVE

## 2017-03-10 LAB — CULTURE, BETA STREP (GROUP B ONLY)
MICRO NUMBER:: 90041059
SPECIMEN QUALITY: ADEQUATE

## 2017-03-14 ENCOUNTER — Encounter: Payer: Self-pay | Admitting: Obstetrics and Gynecology

## 2017-03-14 ENCOUNTER — Ambulatory Visit (INDEPENDENT_AMBULATORY_CARE_PROVIDER_SITE_OTHER): Payer: Medicaid Other | Admitting: Obstetrics and Gynecology

## 2017-03-14 ENCOUNTER — Inpatient Hospital Stay (HOSPITAL_COMMUNITY)
Admission: AD | Admit: 2017-03-14 | Discharge: 2017-03-14 | Disposition: A | Discharge status: Home / Self Care | Payer: Medicaid Other | Source: Ambulatory Visit | Attending: Obstetrics & Gynecology | Admitting: Obstetrics & Gynecology

## 2017-03-14 ENCOUNTER — Encounter (HOSPITAL_COMMUNITY): Payer: Self-pay

## 2017-03-14 VITALS — BP 139/91 | HR 98 | Wt 217.0 lb

## 2017-03-14 DIAGNOSIS — O09893 Supervision of other high risk pregnancies, third trimester: Secondary | ICD-10-CM

## 2017-03-14 DIAGNOSIS — O133 Gestational [pregnancy-induced] hypertension without significant proteinuria, third trimester: Secondary | ICD-10-CM | POA: Diagnosis not present

## 2017-03-14 DIAGNOSIS — I10 Essential (primary) hypertension: Secondary | ICD-10-CM | POA: Diagnosis present

## 2017-03-14 DIAGNOSIS — O26893 Other specified pregnancy related conditions, third trimester: Secondary | ICD-10-CM

## 2017-03-14 DIAGNOSIS — Z3A37 37 weeks gestation of pregnancy: Secondary | ICD-10-CM

## 2017-03-14 DIAGNOSIS — Z8249 Family history of ischemic heart disease and other diseases of the circulatory system: Secondary | ICD-10-CM | POA: Insufficient documentation

## 2017-03-14 DIAGNOSIS — Z3480 Encounter for supervision of other normal pregnancy, unspecified trimester: Secondary | ICD-10-CM

## 2017-03-14 DIAGNOSIS — Z3689 Encounter for other specified antenatal screening: Secondary | ICD-10-CM

## 2017-03-14 DIAGNOSIS — Z6791 Unspecified blood type, Rh negative: Secondary | ICD-10-CM

## 2017-03-14 DIAGNOSIS — O09891 Supervision of other high risk pregnancies, first trimester: Secondary | ICD-10-CM

## 2017-03-14 LAB — URINALYSIS, ROUTINE W REFLEX MICROSCOPIC
Bilirubin Urine: NEGATIVE
GLUCOSE, UA: 50 mg/dL — AB
HGB URINE DIPSTICK: NEGATIVE
Ketones, ur: NEGATIVE mg/dL
NITRITE: NEGATIVE
Protein, ur: NEGATIVE mg/dL
SPECIFIC GRAVITY, URINE: 1.005 (ref 1.005–1.030)
pH: 7 (ref 5.0–8.0)

## 2017-03-14 LAB — COMPREHENSIVE METABOLIC PANEL
ALBUMIN: 2.9 g/dL — AB (ref 3.5–5.0)
ALT: 10 U/L — AB (ref 14–54)
AST: 17 U/L (ref 15–41)
Alkaline Phosphatase: 88 U/L (ref 38–126)
Anion gap: 12 (ref 5–15)
BUN: 6 mg/dL (ref 6–20)
CHLORIDE: 102 mmol/L (ref 101–111)
CO2: 20 mmol/L — AB (ref 22–32)
CREATININE: 0.54 mg/dL (ref 0.44–1.00)
Calcium: 8.5 mg/dL — ABNORMAL LOW (ref 8.9–10.3)
GFR calc Af Amer: >60 mL/min (ref >60)
GFR calc non Af Amer: >60 mL/min (ref >60)
Glucose, Bld: 99 mg/dL (ref 65–99)
Potassium: 3.4 mmol/L — ABNORMAL LOW (ref 3.5–5.1)
Sodium: 134 mmol/L — ABNORMAL LOW (ref 135–145)
Total Bilirubin: 0.4 mg/dL (ref 0.3–1.2)
Total Protein: 6.5 g/dL (ref 6.5–8.1)

## 2017-03-14 LAB — CBC
HCT: 34.2 % — ABNORMAL LOW (ref 36.0–46.0)
Hemoglobin: 11.4 g/dL — ABNORMAL LOW (ref 12.0–15.0)
MCH: 29.2 pg (ref 26.0–34.0)
MCHC: 33.3 g/dL (ref 30.0–36.0)
MCV: 87.7 fL (ref 78.0–100.0)
PLATELETS: 247 10*3/uL (ref 150–400)
RBC: 3.9 MIL/uL (ref 3.87–5.11)
RDW: 14.4 % (ref 11.5–15.5)
WBC: 12.5 10*3/uL — AB (ref 4.0–10.5)

## 2017-03-14 LAB — PROTEIN / CREATININE RATIO, URINE: Creatinine, Urine: 37 mg/dL

## 2017-03-14 NOTE — MAU Note (Addendum)
Pt was at appointment and sent here for HTN. Good FM, no bleeding or LOF. No headaches or vision changes.

## 2017-03-14 NOTE — Progress Notes (Signed)
   PRENATAL VISIT NOTE  Subjective:  Megan Powell is a 27 y.o. G3P2002 at [redacted]w[redacted]d being seen today for ongoing prenatal care.  She is currently monitored for the following issues for this low-risk pregnancy and has History of gestational hypertension; Supervision of other normal pregnancy, antepartum; Rh negative status during pregnancy; and Short interval between pregnancies affecting pregnancy in first trimester, antepartum on their problem list.  Patient reports no complaints.  Contractions: Irritability. Vag. Bleeding: None.  Movement: Present. Denies leaking of fluid.   The following portions of the patient's history were reviewed and updated as appropriate: allergies, current medications, past family history, past medical history, past social history, past surgical history and problem list. Problem list updated.  Objective:   Vitals:   03/14/17 1552  BP: (!) 144/87  Pulse: 98  Weight: 217 lb (98.4 kg)    Fetal Status: Fetal Heart Rate (bpm): 151 Fundal Height: 39 cm Movement: Present  Presentation: Vertex  General:  Alert, oriented and cooperative. Patient is in no acute distress.  Skin: Skin is warm and dry. No rash noted.   Cardiovascular: Normal heart rate noted  Respiratory: Normal respiratory effort, no problems with respiration noted  Abdomen: Soft, gravid, appropriate for gestational age.  Pain/Pressure: Present     Pelvic: Cervical exam performed Dilation: Fingertip Effacement (%): Thick Station: Ballotable  Extremities: Normal range of motion.  Edema: None  Mental Status:  Normal mood and affect. Normal behavior. Normal judgment and thought content.   Assessment and Plan:  Pregnancy: G3P2002 at [redacted]w[redacted]d  1. Supervision of other normal pregnancy, antepartum Patient is doing well without complaints Elevated BP today without symptoms Patient sent to MAU to rule out GHTN/preeclampis and need for IOL. If discharged RTC in 1 week   2. Rh negative status during pregnancy in  third trimester S/p rhogam   Term labor symptoms and general obstetric precautions including but not limited to vaginal bleeding, contractions, leaking of fluid and fetal movement were reviewed in detail with the patient. Please refer to After Visit Summary for other counseling recommendations.  Return in about 1 week (around 03/21/2017) for ROB.   Mora Bellman, MD

## 2017-03-14 NOTE — Progress Notes (Signed)
Repeat BP 139/91

## 2017-03-14 NOTE — MAU Provider Note (Signed)
History     CSN: [id number]  Arrival date and time: 03/14/17 1751   None     Chief Complaint  Patient presents with  . Hypertension   G3P2002 @37 .4 wks sent from office for HTN. Denies HA, visual disturbances, and epigastric pain. Reports good FM. No VB, LOF, or ctx. Pregnancy has been uncomplicated except hx of GHTN in G1, short pregnancy interval, and Rh neg.     OB History    Gravida Para Term Preterm AB Living   3 2 2     2    SAB TAB Ectopic Multiple Live Births         0 2      Past Medical History:  Diagnosis Date  . Hypertension affecting pregnancy     Past Surgical History:  Procedure Laterality Date  . NO PAST SURGERIES      Family History  Problem Relation Age of Onset  . Hypertension Father     Social History   Tobacco Use  . Smoking status: Never Smoker  . Smokeless tobacco: Never Used  Substance Use Topics  . Alcohol use: No    Alcohol/week: 0.0 oz  . Drug use: No    Allergies: No Known Allergies  No medications prior to admission.    Review of Systems  Eyes: Negative for visual disturbance.  Gastrointestinal: Negative for abdominal pain.  Genitourinary: Negative for vaginal bleeding and vaginal discharge.  Neurological: Negative for headaches.   Physical Exam   Blood pressure 122/80, pulse (!) 113, temperature 98.2 F (36.8 C), temperature source Oral, resp. rate 16, height 5\' 5"  (1.651 m), weight 98.9 kg (218 lb), last menstrual period 06/24/2016, SpO2 99 %, currently breastfeeding.  Patient Vitals for the past 24 hrs:  BP Temp Temp src Pulse Resp SpO2 Height Weight  03/14/17 1912 122/80 - - (!) 113 16 - - -  03/14/17 1909 - - - - - 99 % - -  03/14/17 1900 124/79 - - (!) 106 - - - -  03/14/17 1849 - - - - - 96 % - -  03/14/17 1845 127/76 - - (!) 106 - - - -  03/14/17 1844 - - - - - 97 % - -  03/14/17 1839 - - - - - 97 % - -  03/14/17 1834 - - - - - 96 % - -  03/14/17 1830 119/72 - - (!) 117 - - - -  03/14/17 1829 - - - - -  98 % - -  03/14/17 1824 - - - - - 99 % - -  03/14/17 1819 - - - - - 97 % - -  03/14/17 1815 134/77 - - (!) 112 - - - -  03/14/17 1814 - - - - - 98 % - -  03/14/17 1810 138/76 98.2 F (36.8 C) Oral (!) 111 16 - - -  03/14/17 1809 - - - - - 97 % - -  03/14/17 1803 - - - - - - 5\' 5"  (1.651 m) 98.9 kg (218 lb)    Physical Exam  Nursing note and vitals reviewed. Constitutional: She is oriented to person, place, and time. She appears well-developed and well-nourished. No distress.  HENT:  Head: Normocephalic and atraumatic.  Neck: Normal range of motion.  Respiratory: Effort normal.  GI: Soft. She exhibits no distension. There is no tenderness.  gravid  Musculoskeletal: Normal range of motion. She exhibits no edema.  Neurological: She is alert and oriented to person, place,  and time.  Skin: Skin is warm and dry.  Psychiatric: She has a normal mood and affect.  EFM: 150 bpm, mod variability, + accels, no decels Toco: irregular  Results for orders placed or performed during the hospital encounter of 03/14/17 (from the past 24 hour(s))  Protein / creatinine ratio, urine     Status: None   Collection Time: 03/14/17  6:00 PM  Result Value Ref Range   Creatinine, Urine 37.00 mg/dL   Total Protein, Urine <6 mg/dL   Protein Creatinine Ratio        0.00 - 0.15 mg/mg[Cre]  Urinalysis, Routine w reflex microscopic     Status: Abnormal   Collection Time: 03/14/17  6:00 PM  Result Value Ref Range   Color, Urine STRAW (A) YELLOW   APPearance CLEAR CLEAR   Specific Gravity, Urine 1.005 1.005 - 1.030   pH 7.0 5.0 - 8.0   Glucose, UA 50 (A) NEGATIVE mg/dL   Hgb urine dipstick NEGATIVE NEGATIVE   Bilirubin Urine NEGATIVE NEGATIVE   Ketones, ur NEGATIVE NEGATIVE mg/dL   Protein, ur NEGATIVE NEGATIVE mg/dL   Nitrite NEGATIVE NEGATIVE   Leukocytes, UA MODERATE (A) NEGATIVE   RBC / HPF 0-5 0 - 5 RBC/hpf   WBC, UA 0-5 0 - 5 WBC/hpf   Bacteria, UA MANY (A) NONE SEEN   Squamous Epithelial / LPF  0-5 (A) NONE SEEN   Mucus PRESENT   CBC     Status: Abnormal   Collection Time: 03/14/17  6:16 PM  Result Value Ref Range   WBC 12.5 (H) 4.0 - 10.5 K/uL   RBC 3.90 3.87 - 5.11 MIL/uL   Hemoglobin 11.4 (L) 12.0 - 15.0 g/dL   HCT 34.2 (L) 36.0 - 46.0 %   MCV 87.7 78.0 - 100.0 fL   MCH 29.2 26.0 - 34.0 pg   MCHC 33.3 30.0 - 36.0 g/dL   RDW 14.4 11.5 - 15.5 %   Platelets 247 150 - 400 K/uL  Comprehensive metabolic panel     Status: Abnormal   Collection Time: 03/14/17  6:16 PM  Result Value Ref Range   Sodium 134 (L) 135 - 145 mmol/L   Potassium 3.4 (L) 3.5 - 5.1 mmol/L   Chloride 102 101 - 111 mmol/L   CO2 20 (L) 22 - 32 mmol/L   Glucose, Bld 99 65 - 99 mg/dL   BUN 6 6 - 20 mg/dL   Creatinine, Ser 0.54 0.44 - 1.00 mg/dL   Calcium 8.5 (L) 8.9 - 10.3 mg/dL   Total Protein 6.5 6.5 - 8.1 g/dL   Albumin 2.9 (L) 3.5 - 5.0 g/dL   AST 17 15 - 41 U/L   ALT 10 (L) 14 - 54 U/L   Alkaline Phosphatase 88 38 - 126 U/L   Total Bilirubin 0.4 0.3 - 1.2 mg/dL   GFR calc non Af Amer >60 >60 mL/min   GFR calc Af Amer >60 >60 mL/min   Anion gap 12 5 - 15   MAU Course  Procedures  MDM Labs ordered and reviewed. No evidence of pre-e. Normotensive while here. Stable for discharge home.  Assessment and Plan   1. [redacted] weeks gestation of pregnancy   2. Short interval between pregnancies affecting pregnancy in first trimester, antepartum   3. NST (non-stress test) reactive   4. Transient hypertension of pregnancy in third trimester    Discharge home Follow up in OB office 03/18/17 Pre-e precautions  Allergies as of 03/14/2017   No  Known Allergies     Medication List    TAKE these medications   aspirin EC 81 MG tablet Take 1 tablet (81 mg total) by mouth daily. Take after 12 weeks for prevention of preeclampsia later in pregnancy   pantoprazole 20 MG tablet Commonly known as:  PROTONIX Take 1 tablet (20 mg total) by mouth daily.   VITAFOL ULTRA 29-0.6-0.4-200 MG Caps Take 1 capsule by  mouth daily.      Julianne Handler, CNM 03/14/2017, 7:46 PM

## 2017-03-14 NOTE — Discharge Instructions (Signed)

## 2017-03-18 ENCOUNTER — Ambulatory Visit (INDEPENDENT_AMBULATORY_CARE_PROVIDER_SITE_OTHER): Payer: Medicaid Other | Admitting: Certified Nurse Midwife

## 2017-03-18 VITALS — BP 132/87 | HR 94 | Wt 217.0 lb

## 2017-03-18 DIAGNOSIS — Z3483 Encounter for supervision of other normal pregnancy, third trimester: Secondary | ICD-10-CM

## 2017-03-18 DIAGNOSIS — Z3480 Encounter for supervision of other normal pregnancy, unspecified trimester: Secondary | ICD-10-CM

## 2017-03-18 NOTE — Progress Notes (Signed)
Subjective:  Megan Powell is a 27 y.o. G3P2002 at [redacted]w[redacted]d being seen today for ongoing prenatal care.  She is currently monitored for the following issues for this low-risk pregnancy and has History of gestational hypertension; Supervision of other normal pregnancy, antepartum; Rh negative status during pregnancy; and Short interval between pregnancies affecting pregnancy in first trimester, antepartum on their problem list.  Patient reports no complaints.  Contractions: Irritability. Vag. Bleeding: None.  Movement: Present. Denies leaking of fluid. Denies HA, visual disturbances, epigastric pain, SOB, CP.  The following portions of the patient's history were reviewed and updated as appropriate: allergies, current medications, past family history, past medical history, past social history, past surgical history and problem list. Problem list updated.  Objective:   Vitals:   03/18/17 0848  BP: 132/87  Pulse: 94  Weight: 217 lb (98.4 kg)    Fetal Status: Fetal Heart Rate (bpm): 144 Fundal Height: 41 cm Movement: Present  Presentation: Vertex  General:  Alert, oriented and cooperative. Patient is in no acute distress.  Skin: Skin is warm and dry. No rash noted.   Cardiovascular: Normal heart rate noted  Respiratory: Normal respiratory effort, no problems with respiration noted  Abdomen: Soft, gravid, appropriate for gestational age. Pain/Pressure: Present     Pelvic: Vag. Bleeding: None Vag D/C Character: Thin   Cervical exam deferred        Extremities: Normal range of motion.  Edema: None  Mental Status: Normal mood and affect. Normal behavior. Normal judgment and thought content.   Urinalysis: Urine Protein: Negative Urine Glucose: Negative  Assessment and Plan:  Pregnancy: G3P2002 at [redacted]w[redacted]d  1. Supervision of other normal pregnancy, antepartum - watch BP closely >elevated 4 days ago, didn't meet criteria for GHTN, nml today - pre-e precautions - consider membrane sweep at 39 wks -  EFW by leopolds 8.5 lbs  There are no diagnoses linked to this encounter. Term labor symptoms and general obstetric precautions including but not limited to vaginal bleeding, contractions, leaking of fluid and fetal movement were reviewed in detail with the patient. Please refer to After Visit Summary for other counseling recommendations.  Return in about 1 week (around 03/25/2017).   Julianne Handler, CNM

## 2017-03-25 ENCOUNTER — Encounter: Payer: Medicaid Other | Admitting: Advanced Practice Midwife

## 2017-03-29 ENCOUNTER — Inpatient Hospital Stay (HOSPITAL_COMMUNITY): Payer: Medicaid Other | Admitting: Anesthesiology

## 2017-03-29 ENCOUNTER — Inpatient Hospital Stay (HOSPITAL_COMMUNITY)
Admission: AD | Admit: 2017-03-29 | Discharge: 2017-03-30 | DRG: 807 | Disposition: A | Discharge status: Home / Self Care | Payer: Medicaid Other | Source: Ambulatory Visit | Attending: Obstetrics and Gynecology | Admitting: Obstetrics and Gynecology

## 2017-03-29 ENCOUNTER — Encounter (HOSPITAL_COMMUNITY): Payer: Self-pay | Admitting: *Deleted

## 2017-03-29 ENCOUNTER — Other Ambulatory Visit: Payer: Self-pay

## 2017-03-29 ENCOUNTER — Encounter: Payer: Self-pay | Admitting: Advanced Practice Midwife

## 2017-03-29 ENCOUNTER — Ambulatory Visit (INDEPENDENT_AMBULATORY_CARE_PROVIDER_SITE_OTHER): Payer: Medicaid Other | Admitting: Advanced Practice Midwife

## 2017-03-29 VITALS — BP 140/89 | HR 97 | Wt 218.0 lb

## 2017-03-29 DIAGNOSIS — O480 Post-term pregnancy: Secondary | ICD-10-CM | POA: Diagnosis present

## 2017-03-29 DIAGNOSIS — O99214 Obesity complicating childbirth: Secondary | ICD-10-CM | POA: Diagnosis present

## 2017-03-29 DIAGNOSIS — O26899 Other specified pregnancy related conditions, unspecified trimester: Secondary | ICD-10-CM

## 2017-03-29 DIAGNOSIS — O139 Gestational [pregnancy-induced] hypertension without significant proteinuria, unspecified trimester: Secondary | ICD-10-CM | POA: Diagnosis present

## 2017-03-29 DIAGNOSIS — O09891 Supervision of other high risk pregnancies, first trimester: Secondary | ICD-10-CM

## 2017-03-29 DIAGNOSIS — O134 Gestational [pregnancy-induced] hypertension without significant proteinuria, complicating childbirth: Principal | ICD-10-CM | POA: Diagnosis present

## 2017-03-29 DIAGNOSIS — E669 Obesity, unspecified: Secondary | ICD-10-CM | POA: Diagnosis present

## 2017-03-29 DIAGNOSIS — Z6791 Unspecified blood type, Rh negative: Secondary | ICD-10-CM | POA: Diagnosis not present

## 2017-03-29 DIAGNOSIS — O133 Gestational [pregnancy-induced] hypertension without significant proteinuria, third trimester: Secondary | ICD-10-CM

## 2017-03-29 DIAGNOSIS — O26893 Other specified pregnancy related conditions, third trimester: Secondary | ICD-10-CM | POA: Diagnosis present

## 2017-03-29 DIAGNOSIS — Z3A39 39 weeks gestation of pregnancy: Secondary | ICD-10-CM

## 2017-03-29 DIAGNOSIS — Z3480 Encounter for supervision of other normal pregnancy, unspecified trimester: Secondary | ICD-10-CM

## 2017-03-29 HISTORY — DX: Gestational (pregnancy-induced) hypertension without significant proteinuria, unspecified trimester: O13.9

## 2017-03-29 LAB — TYPE AND SCREEN
ABO/RH(D): A NEG
Antibody Screen: NEGATIVE

## 2017-03-29 LAB — CBC
HCT: 36.7 % (ref 36.0–46.0)
Hemoglobin: 12.1 g/dL (ref 12.0–15.0)
MCH: 28.7 pg (ref 26.0–34.0)
MCHC: 33 g/dL (ref 30.0–36.0)
MCV: 87.2 fL (ref 78.0–100.0)
Platelets: 256 10*3/uL (ref 150–400)
RBC: 4.21 MIL/uL (ref 3.87–5.11)
RDW: 14.7 % (ref 11.5–15.5)
WBC: 12.6 10*3/uL — ABNORMAL HIGH (ref 4.0–10.5)

## 2017-03-29 LAB — INFLUENZA PANEL BY PCR (TYPE A & B)
Influenza A By PCR: NEGATIVE
Influenza B By PCR: NEGATIVE

## 2017-03-29 MED ORDER — EPHEDRINE 5 MG/ML INJ
10.0000 mg | INTRAVENOUS | Status: DC | PRN
  Filled 2017-03-29: qty 2

## 2017-03-29 MED ORDER — OXYCODONE-ACETAMINOPHEN 5-325 MG PO TABS: 2.0000 | ORAL_TABLET | ORAL | Status: DC | PRN

## 2017-03-29 MED ORDER — OXYCODONE-ACETAMINOPHEN 5-325 MG PO TABS: 1.0000 | ORAL_TABLET | ORAL | Status: DC | PRN

## 2017-03-29 MED ORDER — TETANUS-DIPHTH-ACELL PERTUSSIS 5-2.5-18.5 LF-MCG/0.5 IM SUSP: 0.5000 mL | Freq: Once | INTRAMUSCULAR | Status: DC

## 2017-03-29 MED ORDER — DIPHENHYDRAMINE HCL 50 MG/ML IJ SOLN: 12.5000 mg | INTRAMUSCULAR | Status: DC | PRN

## 2017-03-29 MED ORDER — LACTATED RINGERS IV SOLN
500.0000 mL | Freq: Once | INTRAVENOUS | Status: AC
  Administered 2017-03-29: 500 mL via INTRAVENOUS

## 2017-03-29 MED ORDER — DIPHENHYDRAMINE HCL 25 MG PO CAPS: 25.0000 mg | ORAL_CAPSULE | Freq: Four times a day (QID) | ORAL | Status: DC | PRN

## 2017-03-29 MED ORDER — PHENYLEPHRINE 40 MCG/ML (10ML) SYRINGE FOR IV PUSH (FOR BLOOD PRESSURE SUPPORT)
80.0000 ug | PREFILLED_SYRINGE | INTRAVENOUS | Status: DC | PRN
  Filled 2017-03-29: qty 5
  Filled 2017-03-29: qty 10

## 2017-03-29 MED ORDER — OXYTOCIN BOLUS FROM INFUSION
500.0000 mL | Freq: Once | INTRAVENOUS | Status: AC
  Administered 2017-03-29: 500 mL via INTRAVENOUS

## 2017-03-29 MED ORDER — ONDANSETRON HCL 4 MG PO TABS: 4.0000 mg | ORAL_TABLET | ORAL | Status: DC | PRN

## 2017-03-29 MED ORDER — LACTATED RINGERS IV SOLN
INTRAVENOUS | Status: DC
  Administered 2017-03-29 (×2): via INTRAVENOUS

## 2017-03-29 MED ORDER — ACETAMINOPHEN 325 MG PO TABS: 650.0000 mg | ORAL_TABLET | ORAL | Status: DC | PRN

## 2017-03-29 MED ORDER — OXYTOCIN 40 UNITS IN LACTATED RINGERS INFUSION - SIMPLE MED: 2.5000 [IU]/h | INTRAVENOUS | Status: DC

## 2017-03-29 MED ORDER — LACTATED RINGERS IV SOLN: 500.0000 mL | Freq: Once | INTRAVENOUS | Status: DC

## 2017-03-29 MED ORDER — SOD CITRATE-CITRIC ACID 500-334 MG/5ML PO SOLN: 30.0000 mL | ORAL | Status: DC | PRN

## 2017-03-29 MED ORDER — TERBUTALINE SULFATE 1 MG/ML IJ SOLN
0.2500 mg | Freq: Once | INTRAMUSCULAR | Status: DC | PRN
  Filled 2017-03-29: qty 1

## 2017-03-29 MED ORDER — DIBUCAINE 1 % RE OINT: 1.0000 "application " | TOPICAL_OINTMENT | RECTAL | Status: DC | PRN

## 2017-03-29 MED ORDER — PRENATAL MULTIVITAMIN CH
1.0000 | ORAL_TABLET | Freq: Every day | ORAL | Status: DC
  Administered 2017-03-30: 1 via ORAL
  Filled 2017-03-29: qty 1

## 2017-03-29 MED ORDER — PHENYLEPHRINE 40 MCG/ML (10ML) SYRINGE FOR IV PUSH (FOR BLOOD PRESSURE SUPPORT)
80.0000 ug | PREFILLED_SYRINGE | INTRAVENOUS | Status: DC | PRN
  Filled 2017-03-29: qty 5

## 2017-03-29 MED ORDER — IBUPROFEN 600 MG PO TABS
600.0000 mg | ORAL_TABLET | Freq: Four times a day (QID) | ORAL | Status: DC
  Administered 2017-03-29 – 2017-03-30 (×4): 600 mg via ORAL
  Filled 2017-03-29 (×4): qty 1

## 2017-03-29 MED ORDER — LIDOCAINE HCL (PF) 1 % IJ SOLN
INTRAMUSCULAR | Status: DC | PRN
  Administered 2017-03-29: 2 mL via EPIDURAL
  Administered 2017-03-29: 5 mL via EPIDURAL
  Administered 2017-03-29: 3 mL via EPIDURAL

## 2017-03-29 MED ORDER — ONDANSETRON HCL 4 MG/2ML IJ SOLN: 4.0000 mg | INTRAMUSCULAR | Status: DC | PRN

## 2017-03-29 MED ORDER — LACTATED RINGERS IV SOLN: 500.0000 mL | INTRAVENOUS | Status: DC | PRN

## 2017-03-29 MED ORDER — BENZOCAINE-MENTHOL 20-0.5 % EX AERO: 1.0000 "application " | INHALATION_SPRAY | CUTANEOUS | Status: DC | PRN

## 2017-03-29 MED ORDER — WITCH HAZEL-GLYCERIN EX PADS
1.0000 "application " | MEDICATED_PAD | CUTANEOUS | Status: DC | PRN
  Administered 2017-03-30: 1 via TOPICAL

## 2017-03-29 MED ORDER — LIDOCAINE HCL (PF) 1 % IJ SOLN
30.0000 mL | INTRAMUSCULAR | Status: DC | PRN
  Filled 2017-03-29: qty 30

## 2017-03-29 MED ORDER — SENNOSIDES-DOCUSATE SODIUM 8.6-50 MG PO TABS
2.0000 | ORAL_TABLET | ORAL | Status: DC
  Administered 2017-03-29: 2 via ORAL
  Filled 2017-03-29: qty 2

## 2017-03-29 MED ORDER — ONDANSETRON HCL 4 MG/2ML IJ SOLN: 4.0000 mg | Freq: Four times a day (QID) | INTRAMUSCULAR | Status: DC | PRN

## 2017-03-29 MED ORDER — SIMETHICONE 80 MG PO CHEW: 80.0000 mg | CHEWABLE_TABLET | ORAL | Status: DC | PRN

## 2017-03-29 MED ORDER — FENTANYL CITRATE (PF) 100 MCG/2ML IJ SOLN: 100.0000 ug | INTRAMUSCULAR | Status: DC | PRN

## 2017-03-29 MED ORDER — COCONUT OIL OIL: 1.0000 "application " | TOPICAL_OIL | Status: DC | PRN

## 2017-03-29 MED ORDER — FENTANYL 2.5 MCG/ML BUPIVACAINE 1/10 % EPIDURAL INFUSION (WH - ANES)
14.0000 mL/h | INTRAMUSCULAR | Status: DC | PRN
  Administered 2017-03-29: 14 mL/h via EPIDURAL
  Filled 2017-03-29: qty 100

## 2017-03-29 MED ORDER — OXYTOCIN 40 UNITS IN LACTATED RINGERS INFUSION - SIMPLE MED
1.0000 m[IU]/min | INTRAVENOUS | Status: DC
  Administered 2017-03-29: 2 m[IU]/min via INTRAVENOUS
  Filled 2017-03-29: qty 1000

## 2017-03-29 NOTE — Anesthesia Preprocedure Evaluation (Signed)
Anesthesia Evaluation  Patient identified by MRN, date of birth, ID band Patient awake    Reviewed: Allergy & Precautions, NPO status , Patient's Chart, lab work & pertinent test results  Airway Mallampati: II  TM Distance: >3 FB Neck ROM: Full    Dental  (+) Teeth Intact, Dental Advisory Given   Pulmonary neg pulmonary ROS,    Pulmonary exam normal breath sounds clear to auscultation       Cardiovascular hypertension (PIH), Normal cardiovascular exam Rhythm:Regular Rate:Normal     Neuro/Psych negative neurological ROS  negative psych ROS   GI/Hepatic negative GI ROS, Neg liver ROS,   Endo/Other  Obesity   Renal/GU negative Renal ROS     Musculoskeletal negative musculoskeletal ROS (+)   Abdominal   Peds  Hematology negative hematology ROS (+) Plt 256k   Anesthesia Other Findings Day of surgery medications reviewed with the patient.  Reproductive/Obstetrics (+) Pregnancy                             Anesthesia Physical Anesthesia Plan  ASA: III  Anesthesia Plan: Epidural   Post-op Pain Management:    Induction:   PONV Risk Score and Plan: 2 and Treatment may vary due to age or medical condition  Airway Management Planned:   Additional Equipment:   Intra-op Plan:   Post-operative Plan:   Informed Consent: I have reviewed the patients History and Physical, chart, labs and discussed the procedure including the risks, benefits and alternatives for the proposed anesthesia with the patient or authorized representative who has indicated his/her understanding and acceptance.   Dental advisory given  Plan Discussed with:   Anesthesia Plan Comments: (Patient identified. Risks/Benefits/Options discussed with patient including but not limited to bleeding, infection, nerve damage, paralysis, failed block, incomplete pain control, headache, blood pressure changes, nausea, vomiting,  reactions to medication both or allergic, itching and postpartum back pain. Confirmed with bedside nurse the patient's most recent platelet count. Confirmed with patient that they are not currently taking any anticoagulation, have any bleeding history or any family history of bleeding disorders. Patient expressed understanding and wished to proceed. All questions were answered. )        Anesthesia Quick Evaluation

## 2017-03-29 NOTE — Anesthesia Procedure Notes (Signed)
Epidural Patient location during procedure: OB Start time: 03/29/2017 2:45 PM End time: 03/29/2017 2:50 PM  Staffing Anesthesiologist: Catalina Gravel, MD Performed: anesthesiologist   Preanesthetic Checklist Completed: patient identified, pre-op evaluation, timeout performed, IV checked, risks and benefits discussed and monitors and equipment checked  Epidural Patient position: sitting Prep: DuraPrep Patient monitoring: blood pressure and continuous pulse ox Approach: midline Location: L3-L4 Injection technique: LOR air  Needle:  Needle type: Tuohy  Needle gauge: 17 G Needle length: 9 cm Needle insertion depth: 6 cm Catheter size: 19 Gauge Catheter at skin depth: 11 cm Test dose: negative and Other (1% Lidocaine)  Additional Notes Patient identified.  Risk benefits discussed including failed block, incomplete pain control, headache, nerve damage, paralysis, blood pressure changes, nausea, vomiting, reactions to medication both toxic or allergic, and postpartum back pain.  Patient expressed understanding and wished to proceed.  All questions were answered.  Sterile technique used throughout procedure and epidural site dressed with sterile barrier dressing. No paresthesia or other complications noted. The patient did not experience any signs of intravascular injection such as tinnitus or metallic taste in mouth nor signs of intrathecal spread such as rapid motor block. Please see nursing notes for vital signs. Reason for block:procedure for pain

## 2017-03-29 NOTE — Progress Notes (Signed)
Megan Powell is a 27 y.o. G3P2002 at [redacted]w[redacted]d by ultrasound admitted for induction of labor due to Hypertension.  Subjective: Patient is resting with epidural in place. Feeling contractions. Foley placed   Objective: BP 123/69   Pulse 94   Temp 98.4 F (36.9 C) (Oral)   Resp 16   Ht 5\' 4"  (1.626 m)   Wt 220 lb 0.6 oz (99.8 kg)   LMP 06/24/2016   BMI 37.77 kg/m  No intake/output data recorded. No intake/output data recorded.  FHT:  FHR: 140 bpm, variability: moderate,  accelerations:  Present,  decelerations:  Absent UC:   regular, every 3 minutes SVE:   7/70/-1  Labs: Lab Results  Component Value Date   WBC 12.6 (H) 03/29/2017   HGB 12.1 03/29/2017   HCT 36.7 03/29/2017   MCV 87.2 03/29/2017   PLT 256 03/29/2017    Assessment / Plan: IOL for post dates  Labor: Progressing on Pitocin, will continue to increase then AROM Preeclampsia:  no signs or symptoms of toxicity Fetal Wellbeing:  Category I Pain Control:  Epidural I/D:  n/a Anticipated MOD:  NSVD  Megan Powell 03/29/2017, 3:34 PM

## 2017-03-29 NOTE — H&P (Signed)
OBSTETRIC ADMISSION HISTORY AND PHYSICAL  Megan Powell is a 27 y.o. female G35P2002 with IUP at [redacted]w[redacted]d by 7 wk [place] presenting for IOL for gHTN. She reports +FMs, No LOF, no VB, no blurry vision, headaches or peripheral edema, and RUQ pain.  She plans on breast  feeding. She request POPs for birth control. She received her prenatal care at [facility]    Dating: By 7 wk [place]  --->  Estimated Date of Delivery: 03/31/17  Sono:  01/18/17  @[redacted]w[redacted]d , CWD, normal anatomy, cephalic presentation, anterior placenta , 1563g, 65% EFW  Clinic  [facility] Prenatal Labs  Dating  7 wk ultrasound Blood type: A/NEG/-- (10/10 1047)   Genetic Screen 1st Trimester scrn  NIPS-Nml XX    AFP-Neg Antibody:NEG (10/10 1047)  Anatomic [place] Spine suboptimally viewed at 19w and 29.5 w Rubella: 1.65 (10/10 1047)  GTT Third trimester: Nml RPR: NON REAC (10/10 1047)   Flu vaccine 9/18 HBsAg: NEGATIVE (10/10 1047)   TDaP vaccine 01/07/17                            Rhogam: 01/07/17 HIV: NONREACTIVE (10/10 1047)   Baby Food Breast                                         GBS: Neg  Contraception POP's then, Nuvaring Pap:  6/18  Circumcision NA   Pediatrician [place] ([facility])    Prenatal History/Complications:  Short interval between pregnancies RH neg  Past Medical History: Past Medical History:  Diagnosis Date  . Hypertension affecting pregnancy     Past Surgical History: Past Surgical History:  Procedure Laterality Date  . NO PAST SURGERIES      Obstetrical History: OB History    Gravida Para Term Preterm AB Living   3 2 2     2    SAB TAB Ectopic Multiple Live Births         0 2      Social History: Social History   Socioeconomic History  . Marital status: Married    Spouse name: Not on file  . Number of children: Not on file  . Years of education: Not on file  . Highest education level: Not on file  Social Needs  . Financial resource strain: Not on file  . Food  insecurity - worry: Not on file  . Food insecurity - inability: Not on file  . Transportation needs - medical: Not on file  . Transportation needs - non-medical: Not on file  Occupational History  . Occupation: homemaker  Tobacco Use  . Smoking status: Never Smoker  . Smokeless tobacco: Never Used  Substance and Sexual Activity  . Alcohol use: No    Alcohol/week: 0.0 oz  . Drug use: No  . Sexual activity: Yes    Partners: Male  Other Topics Concern  . Not on file  Social History Narrative  . Not on file    Family History: Family History  Problem Relation Age of Onset  . Hypertension Father     Allergies: No Known Allergies  Medications Prior to Admission  Medication Sig Dispense Refill Last Dose  . aspirin EC 81 MG tablet Take 1 tablet (81 mg total) by mouth daily. Take after 12 weeks for prevention of preeclampsia later in pregnancy (Patient  not taking: Reported on 03/29/2017) 300 tablet 2 Not Taking  . pantoprazole (PROTONIX) 20 MG tablet Take 1 tablet (20 mg total) by mouth daily. (Patient not taking: Reported on 03/29/2017) 30 tablet 3 Not Taking  . Prenat-Fe Poly-Methfol-FA-DHA (VITAFOL ULTRA) 29-0.6-0.4-200 MG CAPS Take 1 capsule by mouth daily. (Patient not taking: Reported on 03/29/2017) 30 capsule 12 Not Taking     Review of Systems   All systems reviewed and negative except as stated in HPI  Last menstrual period 06/24/2016, currently breastfeeding. General appearance: alert, cooperative and appears stated age Lungs: clear to auscultation bilaterally Heart: regular rate and rhythm Abdomen: soft, non-tender; bowel sounds normal Pelvic: 3/0/-3 Extremities: Homans sign is negative, no sign of DVT DTR's intact  Presentation: cephalic Fetal monitoringBaseline: 150 bpm Uterine activityFrequency: Every 4-7 minutes     Prenatal labs: ABO, Rh: A/NEG/-- (06/19 1519) Antibody: NO ANTIBODIES DETECTED (11/12 0811) Rubella: 1.34 (06/19 1519) RPR: NON-REACTIVE (11/12  0811)  HBsAg: NEGATIVE (06/19 1519)  HIV: NON-REACTIVE (11/12 0813)  GBS:   neg  1 hr Glucola 3rd trimester normal  Genetic screening  Normal  Anatomy US normal  Prenatal Transfer Tool  Maternal Diabetes: No Genetic Screening: Normal Maternal Ultrasounds/Referrals: Normal Fetal Ultrasounds or other Referrals:  None Maternal Substance Abuse:  No Significant Maternal Medications:  None Significant Maternal Lab Results: None  No results found for this or any previous visit (from the past 24 hour(s)).  Patient Active Problem List   Diagnosis Date Noted  . Short interval between pregnancies affecting pregnancy in first trimester, antepartum 08/14/2016  . History of gestational hypertension 12/06/2014  . Supervision of other normal pregnancy, antepartum 12/06/2014  . Rh negative status during pregnancy 12/06/2014    Assessment/Plan:  Megan Powell is a 27 y.o. G3P2002 at [redacted]w[redacted]d here for IOL for pitocin  #Labor: patient 3cm in the office will check and start pitocim #Pain: Per patient request #FWB: Category I #ID:  GBS neg, husband with possible flu will swab pt and put on contact  #MOF: breast  #MOC:POP #Circ:  NA  Courtland Winborne, MD  03/29/2017, 11:52 AM  CNM attestation:  I have seen and examined this patient; I agree with above documentation in the resident's note.   Megan Powell is a 27 y.o. [phone] here for IOL for gHTN- noted in the office at her visit today; denies any other concerns  PE: BP 124/73   Pulse 84   Temp 98.4 F (36.9 C) (Oral)   Resp 16   Ht 5\' 4"  (1.626 m)   Wt 99.8 kg (220 lb 0.6 oz)   LMP 06/24/2016   BMI 37.77 kg/m  Gen: calm comfortable, NAD Resp: normal effort, no distress Abd: gravid  ROS, labs, PMH reviewed  Plan: Admit to [place] to watch BPs Anticipate SVD  SHAW, KIMBERLY CNM 03/29/2017, 5:23 PM

## 2017-03-29 NOTE — Anesthesia Pain Management Evaluation Note (Signed)
  CRNA Pain Management Visit Note  Patient: Megan Powell, 27 y.o., female  "Hello I am a member of the anesthesia team at [facility]. We have an anesthesia team available at all times to provide care throughout the hospital, including epidural management and anesthesia for C-section. I don't know your plan for the delivery whether it a natural birth, water birth, IV sedation, nitrous supplementation, doula or epidural, but we want to meet your pain goals."   1.Was your pain managed to your expectations on prior hospitalizations?   Yes   2.What is your expectation for pain management during this hospitalization?     Epidural  3.How can we help you reach that goal? Be available, epidural infusing and functioning.  Record the patient's initial score and the patient's pain goal.   Pain: 4  Pain Goal: 5 [facility] wants you to be able to say your pain was always managed very well.  [facility] 03/29/2017

## 2017-03-29 NOTE — Patient Instructions (Signed)
Labor Induction Labor induction is when steps are taken to cause a pregnant woman to begin the labor process. Most women go into labor on their own between 37 weeks and 42 weeks of the pregnancy. When this does not happen or when there is a medical need, methods may be used to induce labor. Labor induction causes a pregnant woman's uterus to contract. It also causes the cervix to soften (ripen), open (dilate), and thin out (efface). Usually, labor is not induced before 39 weeks of the pregnancy unless there is a problem with the baby or mother. Before inducing labor, your health care provider will consider a number of factors, including the following:  The medical condition of you and the baby.  How many weeks along you are.  The status of the baby's lung maturity.  The condition of the cervix.  The position of the baby. What are the reasons for labor induction? Labor may be induced for the following reasons:  The health of the baby or mother is at risk.  The pregnancy is overdue by 1 week or more.  The water breaks but labor does not start on its own.  The mother has a health condition or serious illness, such as high blood pressure, infection, placental abruption, or diabetes.  The amniotic fluid amounts are low around the baby.  The baby is distressed. Convenience or wanting the baby to be born on a certain date is not a reason for inducing labor. What methods are used for labor induction? Several methods of labor induction may be used, such as:  Prostaglandin medicine. This medicine causes the cervix to dilate and ripen. The medicine will also start contractions. It can be taken by mouth or by inserting a suppository into the vagina.  Inserting a thin tube (catheter) with a balloon on the end into the vagina to dilate the cervix. Once inserted, the balloon is expanded with water, which causes the cervix to open.  Stripping the membranes. Your health care provider separates  amniotic sac tissue from the cervix, causing the cervix to be stretched and causing the release of a hormone called progesterone. This may cause the uterus to contract. It is often done during an office visit. You will be sent home to wait for the contractions to begin. You will then come in for an induction.  Breaking the water. Your health care provider makes a hole in the amniotic sac using a small instrument. Once the amniotic sac breaks, contractions should begin. This may still take hours to see an effect.  Medicine to trigger or strengthen contractions. This medicine is given through an IV access tube inserted into a vein in your arm. All of the methods of induction, besides stripping the membranes, will be done in the hospital. Induction is done in the hospital so that you and the baby can be carefully monitored. How long does it take for labor to be induced? Some inductions can take up to 2-3 days. Depending on the cervix, it usually takes less time. It takes longer when you are induced early in the pregnancy or if this is your first pregnancy. If a mother is still pregnant and the induction has been going on for 2-3 days, either the mother will be sent home or a cesarean delivery will be needed. What are the risks associated with labor induction? Some of the risks of induction include:  Changes in fetal heart rate, such as too high, too low, or erratic.  Fetal distress.    Chance of infection for the mother and baby.  Increased chance of having a cesarean delivery.  Breaking off (abruption) of the placenta from the uterus (rare).  Uterine rupture (very rare). When induction is needed for medical reasons, the benefits of induction may outweigh the risks. What are some reasons for not inducing labor? Labor induction should not be done if:  It is shown that your baby does not tolerate labor.  You have had previous surgeries on your uterus, such as a myomectomy or the removal of  fibroids.  Your placenta lies very low in the uterus and blocks the opening of the cervix (placenta previa).  Your baby is not in a head-down position.  The umbilical cord drops down into the birth canal in front of the baby. This could cut off the baby's blood and oxygen supply.  You have had a previous cesarean delivery.  There are unusual circumstances, such as the baby being extremely premature. This information is not intended to replace advice given to you by your health care provider. Make sure you discuss any questions you have with your health care provider. Document Released: 07/04/2006 Document Revised: 07/21/2015 Document Reviewed: 09/11/2012 Elsevier Interactive Patient Education  2017 [employer]vier Inc[employer]  

## 2017-03-29 NOTE — Progress Notes (Signed)
First BP 140/89. BP recheck 134/83

## 2017-03-29 NOTE — Progress Notes (Signed)
   PRENATAL VISIT NOTE  Subjective:  Megan Powell is a 27 y.o. G3P2002 at [redacted]w[redacted]d being seen today for ongoing prenatal care.  She is currently monitored for the following issues for this high-risk pregnancy and has History of gestational hypertension; Supervision of other normal pregnancy, antepartum; Rh negative status during pregnancy; and Short interval between pregnancies affecting pregnancy in first trimester, antepartum on their problem list.  Patient reports contractions every 7-10 minutes, painful.  Contractions: Irregular. Vag. Bleeding: None.  Movement: Present. Denies leaking of fluid.   The following portions of the patient's history were reviewed and updated as appropriate: allergies, current medications, past family history, past medical history, past social history, past surgical history and problem list. Problem list updated.  Objective:   Vitals:   03/29/17 0804 03/29/17 0832  BP: 134/83 140/89  Pulse: 97   Weight: 218 lb (98.9 kg)     Fetal Status: Fetal Heart Rate (bpm): 138 Fundal Height: 41 cm Movement: Present  Presentation: Vertex  General:  Alert, oriented and cooperative. Patient is in no acute distress.  Skin: Skin is warm and dry. No rash noted.   Cardiovascular: Normal heart rate noted  Respiratory: Normal respiratory effort, no problems with respiration noted  Abdomen: Soft, gravid, appropriate for gestational age.  Pain/Pressure: Present     Pelvic: Cervical exam performed Dilation: 3 Effacement (%): 0 Station: -3  Extremities: Normal range of motion.  Edema: None  Mental Status:  Normal mood and affect. Normal behavior. Normal judgment and thought content.   Assessment and Plan:  Pregnancy: G3P2002 at [redacted]w[redacted]d  1. Supervision of other normal pregnancy, antepartum   2. Gestational hypertension, third trimester - TO BS for IOL   Return in about 4 weeks (around 04/26/2017) for Postpartum visit.   Manya Silvas, CNM

## 2017-03-30 LAB — RPR: RPR: NONREACTIVE

## 2017-03-30 MED ORDER — IBUPROFEN 600 MG PO TABS: 600.0000 mg | ORAL_TABLET | Freq: Four times a day (QID) | ORAL | 0 refills | Status: DC | PRN

## 2017-03-30 NOTE — Lactation Note (Addendum)
This note was copied from a baby's chart. Lactation Consultation Note  Patient Name: Megan Powell [id number] Date: 03/30/2017 Reason for consult: Initial assessment;Other (Comment)  Baby is 60 hours old  LC reviewed and updated the doc flow sheets per mom  And the baby recently has breast fed.  This mom is an experienced breast feeding mother and has  A hx of plenty of milk. Mom denies sore nipples and feels breast feeding  Is going well. Sore nipple and engorgement prevention and tx reviewed.  Per mom has hand pump, and a DEBP Spectra 2 at home.  Mother informed of post-discharge support and given phone number to the lactation department, including services for phone call assistance; out-patient appointments; and breastfeeding support group. List of other breastfeeding resources in the community given in the handout. Encouraged mother to call for problems or concerns related to breastfeeding.   Maternal Data Does the patient have breastfeeding experience prior to this delivery?: Yes  Feeding per mom  Feeding Type: Breast Fed Length of feed: 30 min(per mom , swallows )  LATCH Score                   Interventions Interventions: Breast feeding basics reviewed  Lactation Tools Discussed/Used WIC Program: No Pump Review: Setup, frequency, and cleaning;Milk Storage(LC reviewed )   Consult Status Consult Status: Complete    Megan Powell 03/30/2017, 3:12 PM

## 2017-03-30 NOTE — Anesthesia Postprocedure Evaluation (Signed)
Anesthesia Post Note  Patient: Megan Powell  Procedure(s) Performed: AN AD HOC LABOR EPIDURAL     Patient location during evaluation: Mother Baby Anesthesia Type: Epidural Level of consciousness: awake Pain management: satisfactory to patient Vital Signs Assessment: post-procedure vital signs reviewed and stable Respiratory status: spontaneous breathing Cardiovascular status: stable Anesthetic complications: no    Last Vitals:  Vitals:   03/29/17 1935 03/29/17 2337  BP: (!) 143/77 122/73  Pulse: 94 85  Resp: 18 18  Temp: 36.9 C 36.6 C  SpO2: 100% 100%    Last Pain:  Vitals:   03/30/17 0537  TempSrc:   PainSc: 0-No pain   Pain Goal:                 [employer]

## 2017-03-30 NOTE — Discharge Instructions (Signed)
Vaginal Delivery, Care After °Refer to this sheet in the next few weeks. These instructions provide you with information about caring for yourself after vaginal delivery. Your health care provider may also give you more specific instructions. Your treatment has been planned according to current medical practices, but problems sometimes occur. Call your health care provider if you have any problems or questions. °What can I expect after the procedure? °After vaginal delivery, it is common to have: °· Some bleeding from your vagina. °· Soreness in your abdomen, your vagina, and the area of skin between your vaginal opening and your anus (perineum). °· Pelvic cramps. °· Fatigue. ° °Follow these instructions at home: °Medicines °· Take over-the-counter and prescription medicines only as told by your health care provider. °· If you were prescribed an antibiotic medicine, take it as told by your health care provider. Do not stop taking the antibiotic until it is finished. °Driving ° °· Do not drive or operate heavy machinery while taking prescription pain medicine. °· Do not drive for 24 hours if you received a sedative. °Lifestyle °· Do not drink alcohol. This is especially important if you are breastfeeding or taking medicine to relieve pain. °· Do not use tobacco products, including cigarettes, chewing tobacco, or e-cigarettes. If you need help quitting, ask your health care provider. °Eating and drinking °· Drink at least 8 eight-ounce glasses of water every day unless you are told not to by your health care provider. If you choose to breastfeed your baby, you may need to drink more water than this. °· Eat high-fiber foods every day. These foods may help prevent or relieve constipation. High-fiber foods include: °? Whole grain cereals and breads. °? Brown rice. °? Beans. °? Fresh fruits and vegetables. °Activity °· Return to your normal activities as told by your health care provider. Ask your health care provider  what activities are safe for you. °· Rest as much as possible. Try to rest or take a nap when your baby is sleeping. °· Do not lift anything that is heavier than your baby or 10 lb (4.5 kg) until your health care provider says that it is safe. °· Talk with your health care provider about when you can engage in sexual activity. This may depend on your: °? Risk of infection. °? Rate of healing. °? Comfort and desire to engage in sexual activity. °Vaginal Care °· If you have an episiotomy or a vaginal tear, check the area every day for signs of infection. Check for: °? More redness, swelling, or pain. °? More fluid or blood. °? Warmth. °? Pus or a bad smell. °· Do not use tampons or douches until your health care provider says this is safe. °· Watch for any blood clots that may pass from your vagina. These may look like clumps of dark red, brown, or black discharge. °General instructions °· Keep your perineum clean and dry as told by your health care provider. °· Wear loose, comfortable clothing. °· Wipe from front to back when you use the toilet. °· Ask your health care provider if you can shower or take a bath. If you had an episiotomy or a perineal tear during labor and delivery, your health care provider may tell you not to take baths for a certain length of time. °· Wear a bra that supports your breasts and fits you well. °· If possible, have someone help you with household activities and help care for your baby for at least a few days after   you leave the hospital. °· Keep all follow-up visits for you and your baby as told by your health care provider. This is important. °Contact a health care provider if: °· You have: °? Vaginal discharge that has a bad smell. °? Difficulty urinating. °? Pain when urinating. °? A sudden increase or decrease in the frequency of your bowel movements. °? More redness, swelling, or pain around your episiotomy or vaginal tear. °? More fluid or blood coming from your episiotomy or  vaginal tear. °? Pus or a bad smell coming from your episiotomy or vaginal tear. °? A fever. °? A rash. °? Little or no interest in activities you used to enjoy. °? Questions about caring for yourself or your baby. °· Your episiotomy or vaginal tear feels warm to the touch. °· Your episiotomy or vaginal tear is separating or does not appear to be healing. °· Your breasts are painful, hard, or turn red. °· You feel unusually sad or worried. °· You feel nauseous or you vomit. °· You pass large blood clots from your vagina. If you pass a blood clot from your vagina, save it to show to your health care provider. Do not flush blood clots down the toilet without having your health care provider look at them. °· You urinate more than usual. °· You are dizzy or light-headed. °· You have not breastfed at all and you have not had a menstrual period for 12 weeks after delivery. °· You have stopped breastfeeding and you have not had a menstrual period for 12 weeks after you stopped breastfeeding. °Get help right away if: °· You have: °? Pain that does not go away or does not get better with medicine. °? Chest pain. °? Difficulty breathing. °? Blurred vision or spots in your vision. °? Thoughts about hurting yourself or your baby. °· You develop pain in your abdomen or in one of your legs. °· You develop a severe headache. °· You faint. °· You bleed from your vagina so much that you fill two sanitary pads in one hour. °This information is not intended to replace advice given to you by your health care provider. Make sure you discuss any questions you have with your health care provider. °Document Released: 02/10/2000 Document Revised: 07/27/2015 Document Reviewed: 02/27/2015 °Elsevier Interactive Patient Education © 2018 [employer] ° °

## 2017-03-30 NOTE — Discharge Summary (Signed)
OB Discharge Summary     Patient Name: Megan Powell DOB: 23-May-1990 MRN: [id number]  Date of admission: 03/29/2017 Delivering MD: De Hollingshead   Date of discharge: 03/30/2017  Admitting diagnosis: INDUCTION Intrauterine pregnancy: [redacted]w[redacted]d     Secondary diagnosis:  Principal Problem:   Supervision of other normal pregnancy, antepartum Active Problems:   Rh negative status during pregnancy   Short interval between pregnancies affecting pregnancy in first trimester, antepartum   Gestational hypertension without significant proteinuria   Vaginal delivery  Additional problems: none     Discharge diagnosis: Term Pregnancy Delivered and Gestational Hypertension                                                                                                Post partum procedures:(baby Rh neg- Rhogam not indicated)  Augmentation: Pitocin  Complications: None  Hospital course:  Induction of Labor With Vaginal Delivery   27 y.o. yo G3P3003 at [redacted]w[redacted]d was admitted to the hospital 03/29/2017 for induction of labor.  Indication for induction: Gestational hypertension. She was seen in the office in the morning and noted to have elevated BPs- sent for IOL. Patient had an uncomplicated labor course as follows: Membrane Rupture Time/Date: 2:30 PM ,03/29/2017   Intrapartum Procedures: Episiotomy: None [1]                                         Lacerations:  None [1]  Patient had delivery of a Viable infant.  Information for the patient's newborn:  Miyoshi, Ligas Girl Lashea [[id number]]  Delivery Method: Vaginal, Spontaneous(Filed from Delivery Summary)   03/29/2017  Details of delivery can be found in separate delivery note.  Patient had a routine postpartum course. Patient is discharged home 03/30/17 per her request. Her PP blood pressures remained normal and she was not started on antihypertensives.  Physical exam  Vitals:   03/29/17 1830 03/29/17 1935 03/29/17 2337 03/30/17 0700  BP: 128/73 (!)  143/77 122/73   Pulse: 88 94 85   Resp: 18 18 18    Temp: 98.6 F (37 C) 98.4 F (36.9 C) 97.8 F (36.6 C)   TempSrc:  Oral Oral   SpO2: 99% 100% 100%   Weight:    95.3 kg (210 lb)  Height:       General: alert and cooperative Lochia: appropriate Uterine Fundus: firm Incision: N/A DVT Evaluation: No evidence of DVT seen on physical exam. Labs: Lab Results  Component Value Date   WBC 12.6 (H) 03/29/2017   HGB 12.1 03/29/2017   HCT 36.7 03/29/2017   MCV 87.2 03/29/2017   PLT 256 03/29/2017   CMP Latest Ref Rng & Units 03/14/2017  Glucose 65 - 99 mg/dL 99  BUN 6 - 20 mg/dL 6  Creatinine 0.44 - 1.00 mg/dL 0.54  Sodium 135 - 145 mmol/L 134(L)  Potassium 3.5 - 5.1 mmol/L 3.4(L)  Chloride 101 - 111 mmol/L 102  CO2 22 - 32 mmol/L 20(L)  Calcium 8.9 - 10.3 mg/dL 8.5(L)  Total Protein  6.5 - 8.1 g/dL 6.5  Total Bilirubin 0.3 - 1.2 mg/dL 0.4  Alkaline Phos 38 - 126 U/L 88  AST 15 - 41 U/L 17  ALT 14 - 54 U/L 10(L)    Discharge instruction: per After Visit Summary and "Baby and Me Booklet".  After visit meds:  Allergies as of 03/30/2017   No Known Allergies     Medication List    STOP taking these medications   aspirin EC 81 MG tablet   calcium carbonate 500 MG chewable tablet Commonly known as:  TUMS - dosed in mg elemental calcium   pantoprazole 20 MG tablet Commonly known as:  PROTONIX     TAKE these medications   ibuprofen 600 MG tablet Commonly known as:  ADVIL,MOTRIN Take 1 tablet (600 mg total) by mouth every 6 (six) hours as needed.   VITAFOL ULTRA 29-0.6-0.4-200 MG Caps Take 1 capsule by mouth daily.       Diet: routine diet  Activity: Advance as tolerated. Pelvic rest for 6 weeks.   Outpatient follow UJ:WJXBJ pressure check in 1 week; PP visit in 4 weeks Follow up Appt: Future Appointments  Date Time Provider [place]  05/09/2017  3:00 PM Guss Bunde, MD [facility]   Follow up Visit:No Follow-up on file.  Postpartum  contraception: Progesterone only pills  Newborn Data: Live born female  Birth Weight: 8 lb 7.3 oz (3836 g) APGAR: 9, 9  Newborn Delivery   Birth date/time:  03/29/2017 16:32:00 Delivery type:  Vaginal, Spontaneous     Baby Feeding: Breast Disposition:home with mother   03/30/2017 Serita Grammes, CNM 9:17 AM

## 2017-03-31 ENCOUNTER — Ambulatory Visit: Payer: Self-pay

## 2017-03-31 NOTE — Lactation Note (Signed)
This note was copied from a baby's chart. Lactation Consultation Note  Patient Name: Megan Powell OFBPZ'W Date: 03/31/2017 Reason for consult: Follow-up assessment;Infant weight loss  Baby is 85 hours old  LC reviewed and updated the doc flow sheets  1st showed  Mom how to finger feed with curved tip syringe at home baby took 3 ml After finger feeding - latched the baby in cross cradle and depth and comfort obtained. Baby fed for 10 mins. Multiple swallows noted.  LC reviewed sore nipple and engorgement prevention and tx. LC noted the left nipple to have bruising on the upper edge of the nipple.  Per mom latch was comfortable.  Baby has a tendency to not open mouth wide, was better at this latch.  LC instructed mom on the use of shells, comfort gels.  LC gave mom the option of adding post pumping after 3-4 feedings or hand expressing  So she will have milk to work with. Mother informed of post-discharge support and given phone number to the lactation department, including services for phone call assistance; out-patient appointments; and breastfeeding support group. List of other breastfeeding resources in the community given in the handout. Encouraged mother to call for problems or concerns related to breastfeeding.   Maternal Data Has patient been taught Hand Expression?: Yes(expressed well )  Feeding Feeding Type: Breast Fed Length of feed: 10 min(multiple swallows )  LATCH Score Latch: Grasps breast easily, tongue down, lips flanged, rhythmical sucking.  Audible Swallowing: Spontaneous and intermittent  Type of Nipple: Everted at rest and after stimulation  Comfort (Breast/Nipple): Filling, red/small blisters or bruises, mild/mod discomfort  Hold (Positioning): Assistance needed to correctly position infant at breast and maintain latch.  LATCH Score: 8  Interventions Interventions: Breast feeding basics reviewed;Assisted with latch;Skin to skin;Breast massage;Hand  express;Breast compression;Adjust position;Position options;Expressed milk  Lactation Tools Discussed/Used Tools: Shells;Pump;Comfort gels Shell Type: Inverted Breast pump type: Double-Electric Breast Pump Pump Review: Setup, frequency, and cleaning;Milk Storage Initiated by:: Mason Jim RN Date initiated:: 03/31/17   Consult Status Consult Status: Complete Date: 03/31/17 Follow-up type: In-patient    [place] 03/31/2017, 11:04 AM

## 2017-05-09 ENCOUNTER — Ambulatory Visit (INDEPENDENT_AMBULATORY_CARE_PROVIDER_SITE_OTHER): Payer: Medicaid Other | Admitting: Obstetrics & Gynecology

## 2017-05-09 ENCOUNTER — Encounter: Payer: Self-pay | Admitting: Obstetrics & Gynecology

## 2017-05-09 VITALS — BP 124/73 | HR 82 | Wt 199.0 lb

## 2017-05-09 DIAGNOSIS — Z9889 Other specified postprocedural states: Secondary | ICD-10-CM

## 2017-05-09 DIAGNOSIS — Z1389 Encounter for screening for other disorder: Secondary | ICD-10-CM | POA: Diagnosis not present

## 2017-05-09 MED ORDER — NORETHINDRONE 0.35 MG PO TABS: 1.0000 | ORAL_TABLET | Freq: Every day | ORAL | 11 refills | Status: DC

## 2017-05-09 NOTE — Progress Notes (Signed)
Post Partum Exam  Megan Powell is a 27 y.o. G34P3003 female who presents for a postpartum visit. She is 6 weeks postpartum following a spontaneous vaginal delivery. I have fully reviewed the prenatal and intrapartum course. The delivery was at [redacted]w[redacted]d gestational weeks.  Anesthesia: epidural. Postpartum course has been urnemarkable. Baby's course has been unremarkable. Baby is feeding by breast. Bleeding no bleeding. Bowel function is normal. Bladder function is normal. Patient is not sexually active. Contraception method is oral progesterone-only contraceptive. Postpartum depression screening:neg  The following portions of the patient's history were reviewed and updated as appropriate: allergies, current medications, past family history, past medical history, past social history, past surgical history and problem list. Last pap smear done 2018 and was Normal  Review of Systems Pertinent items noted in HPI and remainder of comprehensive ROS otherwise negative.    Objective:  Weight 199 lb (90.3 kg), last menstrual period 06/24/2016, currently breastfeeding.   Vitals:   05/09/17 1452  BP: 124/73  Pulse: 82  Weight: 199 lb (90.3 kg)     General:  alert, cooperative and no distress   Breasts:  negative findings: normal in size and symmetry  Lungs: clear to auscultation bilaterally  Heart:  regular rate and rhythm  Abdomen: soft, non-tender; bowel sounds normal; no masses,  no organomegaly   Vulva:  normal  Vagina: normal vagina  Cervix:  no lesions  Corpus: normal size, contour, position, consistency, mobility, non-tender  Adnexa:  normal adnexa  Rectal Exam: Not performed.        Assessment:    Nml postpartum exam. Pap smear not done at today's visit.   Plan:   1. Contraception: oral progesterone-only contraceptive 2. Wants Nuva Ring at 6 months post partum 3. Follow up in: 1 year or as needed.

## 2017-07-06 ENCOUNTER — Encounter: Payer: Self-pay | Admitting: Obstetrics & Gynecology

## 2017-07-08 ENCOUNTER — Telehealth: Payer: Self-pay

## 2017-07-08 DIAGNOSIS — IMO0001 Reserved for inherently not codable concepts without codable children: Secondary | ICD-10-CM

## 2017-07-08 MED ORDER — ETONOGESTREL-ETHINYL ESTRADIOL 0.12-0.015 MG/24HR VA RING: VAGINAL_RING | VAGINAL | 12 refills | Status: DC

## 2017-07-08 NOTE — Telephone Encounter (Signed)
PT sent a MyChart message asking for Nuvaring Rx. Dr.Leggett's last note states she can have nuvaring post partum. Rx sent.

## 2017-08-20 ENCOUNTER — Encounter: Payer: Self-pay | Admitting: Obstetrics & Gynecology

## 2018-08-13 ENCOUNTER — Other Ambulatory Visit: Payer: Self-pay | Admitting: *Deleted

## 2018-08-13 DIAGNOSIS — Z3481 Encounter for supervision of other normal pregnancy, first trimester: Secondary | ICD-10-CM

## 2018-08-13 MED ORDER — VITAFOL ULTRA 29-0.6-0.4-200 MG PO CAPS: 1.0000 | ORAL_CAPSULE | Freq: Every day | ORAL | 12 refills | Status: DC

## 2018-09-01 ENCOUNTER — Telehealth: Payer: Self-pay | Admitting: *Deleted

## 2018-09-01 MED ORDER — DOXYLAMINE-PYRIDOXINE 10-10 MG PO TBEC: 1.0000 | DELAYED_RELEASE_TABLET | Freq: Two times a day (BID) | ORAL | 6 refills | Status: DC

## 2018-09-01 NOTE — Telephone Encounter (Signed)
Pt called stating that she is [redacted] weeks pregnant and the nausea has started in over the weekend.  She has a NOB scheduled in about 1 month.  She is reqwuesting a RX for antimetic.  Per protocol Diclegis was sent to [facility].  Pt will let me know if the nausea is not controlled with the Diclegis.

## 2018-09-10 ENCOUNTER — Encounter: Payer: Self-pay | Admitting: *Deleted

## 2018-09-10 DIAGNOSIS — Z3480 Encounter for supervision of other normal pregnancy, unspecified trimester: Secondary | ICD-10-CM | POA: Insufficient documentation

## 2018-09-12 ENCOUNTER — Other Ambulatory Visit: Payer: Self-pay

## 2018-09-12 ENCOUNTER — Ambulatory Visit (INDEPENDENT_AMBULATORY_CARE_PROVIDER_SITE_OTHER): Payer: Medicaid Other | Admitting: Certified Nurse Midwife

## 2018-09-12 ENCOUNTER — Other Ambulatory Visit (HOSPITAL_COMMUNITY)
Admission: RE | Admit: 2018-09-12 | Discharge: 2018-09-12 | Disposition: A | Discharge status: Home / Self Care | Payer: Medicaid Other | Source: Ambulatory Visit | Attending: Certified Nurse Midwife | Admitting: Certified Nurse Midwife

## 2018-09-12 ENCOUNTER — Other Ambulatory Visit: Payer: Medicaid Other

## 2018-09-12 ENCOUNTER — Encounter: Payer: Self-pay | Admitting: Certified Nurse Midwife

## 2018-09-12 VITALS — BP 135/81 | HR 89 | Wt 170.0 lb

## 2018-09-12 DIAGNOSIS — O360110 Maternal care for anti-D [Rh] antibodies, first trimester, not applicable or unspecified: Secondary | ICD-10-CM

## 2018-09-12 DIAGNOSIS — Z6791 Unspecified blood type, Rh negative: Secondary | ICD-10-CM

## 2018-09-12 DIAGNOSIS — Z3480 Encounter for supervision of other normal pregnancy, unspecified trimester: Secondary | ICD-10-CM

## 2018-09-12 DIAGNOSIS — Z8759 Personal history of other complications of pregnancy, childbirth and the puerperium: Secondary | ICD-10-CM | POA: Diagnosis not present

## 2018-09-12 DIAGNOSIS — Z3A01 Less than 8 weeks gestation of pregnancy: Secondary | ICD-10-CM | POA: Diagnosis not present

## 2018-09-12 DIAGNOSIS — O26891 Other specified pregnancy related conditions, first trimester: Secondary | ICD-10-CM | POA: Diagnosis not present

## 2018-09-12 DIAGNOSIS — O09291 Supervision of pregnancy with other poor reproductive or obstetric history, first trimester: Secondary | ICD-10-CM | POA: Diagnosis not present

## 2018-09-12 MED ORDER — ASPIRIN 81 MG PO CHEW: 81.0000 mg | CHEWABLE_TABLET | Freq: Every day | ORAL | 3 refills | Status: DC

## 2018-09-12 NOTE — Progress Notes (Signed)
Subjective:  Megan Powell is a 28 y.o. [phone] at 47w6dbeing seen today for her first prenatal visit.  She is currently monitored for the following issues for this low-risk pregnancy and has History of gestational hypertension; Vaginal delivery; and Supervision of other normal pregnancy, antepartum on their problem list.  Patient reports nausea and vomiting but controlled with Diclegis bid  Contractions: Not present. Vag. Bleeding: None.  Movement: Absent. Denies leaking of fluid.   The following portions of the patient's history were reviewed and updated as appropriate: allergies, current medications, past family history, past medical history, past social history, past surgical history and problem list. Problem list updated.  Objective:   Vitals:   09/12/18 0940  BP: 135/81  Pulse: 89  Weight: 170 lb (77.1 kg)    Fetal Status: Fetal Heart Rate (bpm): 169   Movement: Absent     General:  Alert, oriented and cooperative. Patient is in no acute distress.  Skin: Skin is warm and dry.   Cardiovascular: RRR  Respiratory: CTAB  Abdomen: Soft, NT, appropriate for gestational age. Pain/Pressure: Absent     Pelvic: Vag. Bleeding: None Vag D/C Character: Thin   Cervical exam deferred        Extremities: Normal range of motion.  Edema: None  Mental Status: Normal mood and affect. Normal behavior. Normal judgment and thought content.  Breast: breasts appear normal, no suspicious masses, no skin or nipple changes or axillary nodes.   Urinalysis:      Assessment and Plan:  Pregnancy: G4P3003 at 767w6d1. Supervision of other normal pregnancy, antepartum - Obstetric panel - HIV antibody (with reflex) - Culture, OB Urine - Babyscripts Schedule Optimization - [place]; Future [facility]. Rh negative status during pregnancy in first trimester - Rhogam at 28 wks  3. History of gestational hypertension - Comp Met (CMET) - Protein / creatinine  ratio, urine - aspirin 81 MG chewable tablet; Chew 1 tablet (81 mg total) by mouth daily. Start after 12 weeks  Dispense: 60 tablet; Refill: 3  Preterm labor symptoms and general obstetric precautions including but not limited to vaginal bleeding, contractions, leaking of fluid and fetal movement were reviewed in detail with the patient. Please refer to After Visit Summary for other counseling recommendations.  Return in about 3 weeks (around 10/03/2018).   BhJulianne HandlerCNM

## 2018-09-12 NOTE — Progress Notes (Signed)
Bedside U/S shows single IUP with CRL measuring 14.21mm and FHT of 169 BPM

## 2018-09-14 LAB — URINE CULTURE, OB REFLEX: Organism ID, Bacteria: NO GROWTH

## 2018-09-14 LAB — CULTURE, OB URINE

## 2018-09-15 LAB — COMPREHENSIVE METABOLIC PANEL
AG Ratio: 1.6 (calc) (ref 1.0–2.5)
ALT: 11 U/L (ref 6–29)
AST: 12 U/L (ref 10–30)
Albumin: 4 g/dL (ref 3.6–5.1)
Alkaline phosphatase (APISO): 26 U/L — ABNORMAL LOW (ref 31–125)
BUN: 8 mg/dL (ref 7–25)
CO2: 20 mmol/L (ref 20–32)
Calcium: 9.3 mg/dL (ref 8.6–10.2)
Chloride: 104 mmol/L (ref 98–110)
Creat: 0.68 mg/dL (ref 0.50–1.10)
Globulin: 2.5 g/dL (calc) (ref 1.9–3.7)
Glucose, Bld: 56 mg/dL — ABNORMAL LOW (ref 65–99)
Potassium: 3.5 mmol/L (ref 3.5–5.3)
Sodium: 137 mmol/L (ref 135–146)
Total Bilirubin: 0.4 mg/dL (ref 0.2–1.2)
Total Protein: 6.5 g/dL (ref 6.1–8.1)

## 2018-09-15 LAB — OBSTETRIC PANEL
Absolute Monocytes: 607 cells/uL (ref 200–950)
Antibody Screen: NOT DETECTED
Basophils Absolute: 52 cells/uL (ref 0–200)
Basophils Relative: 0.7 %
Eosinophils Absolute: 74 cells/uL (ref 15–500)
Eosinophils Relative: 1 %
HCT: 40.9 % (ref 35.0–45.0)
Hemoglobin: 13.4 g/dL (ref 11.7–15.5)
Hepatitis B Surface Ag: NONREACTIVE
Lymphs Abs: 2420 cells/uL (ref 850–3900)
MCH: 29.4 pg (ref 27.0–33.0)
MCHC: 32.8 g/dL (ref 32.0–36.0)
MCV: 89.7 fL (ref 80.0–100.0)
MPV: 11 fL (ref 7.5–12.5)
Monocytes Relative: 8.2 %
Neutro Abs: 4248 cells/uL (ref 1500–7800)
Neutrophils Relative %: 57.4 %
Platelets: 250 10*3/uL (ref 140–400)
RBC: 4.56 10*6/uL (ref 3.80–5.10)
RDW: 14.9 % (ref 11.0–15.0)
RPR Ser Ql: NONREACTIVE
Rubella: 1.48 index
Total Lymphocyte: 32.7 %
WBC: 7.4 10*3/uL (ref 3.8–10.8)

## 2018-09-15 LAB — PROTEIN / CREATININE RATIO, URINE
Creatinine, Urine: 103 mg/dL (ref 20–275)
Protein/Creat Ratio: 68 mg/g creat (ref 21–161)
Protein/Creatinine Ratio: 0.068 mg/mg creat (ref 0.021–0.16)
Total Protein, Urine: 7 mg/dL (ref 5–24)

## 2018-09-15 LAB — SPECIMEN COMPROMISED

## 2018-09-15 LAB — HIV ANTIBODY (ROUTINE TESTING W REFLEX): HIV 1&2 Ab, 4th Generation: NONREACTIVE

## 2018-09-17 LAB — GC/CHLAMYDIA PROBE AMP (~~LOC~~) NOT AT ARMC
Chlamydia: NEGATIVE
Neisseria Gonorrhea: NEGATIVE

## 2018-10-07 ENCOUNTER — Encounter: Payer: Self-pay | Admitting: Certified Nurse Midwife

## 2018-10-07 ENCOUNTER — Ambulatory Visit (INDEPENDENT_AMBULATORY_CARE_PROVIDER_SITE_OTHER): Payer: Medicaid Other | Admitting: Certified Nurse Midwife

## 2018-10-07 ENCOUNTER — Other Ambulatory Visit: Payer: Self-pay

## 2018-10-07 VITALS — BP 126/76 | HR 87 | Wt 175.0 lb

## 2018-10-07 DIAGNOSIS — Z3A11 11 weeks gestation of pregnancy: Secondary | ICD-10-CM

## 2018-10-07 DIAGNOSIS — Z8759 Personal history of other complications of pregnancy, childbirth and the puerperium: Secondary | ICD-10-CM

## 2018-10-07 DIAGNOSIS — Z3481 Encounter for supervision of other normal pregnancy, first trimester: Secondary | ICD-10-CM

## 2018-10-07 DIAGNOSIS — Z3480 Encounter for supervision of other normal pregnancy, unspecified trimester: Secondary | ICD-10-CM

## 2018-10-07 NOTE — Progress Notes (Signed)
   PRENATAL VISIT NOTE  Subjective:  Megan Powell is a 28 y.o. [phone] at [redacted]w[redacted]d being seen today for ongoing prenatal care.  She is currently monitored for the following issues for this high-risk pregnancy and has History of gestational hypertension; Vaginal delivery; and Supervision of other normal pregnancy, antepartum on their problem list.  Patient reports no complaints.  Contractions: Not present. Vag. Bleeding: None.  Movement: Absent. Denies leaking of fluid.   The following portions of the patient's history were reviewed and updated as appropriate: allergies, current medications, past family history, past medical history, past social history, past surgical history and problem list.   Objective:   Vitals:   10/07/18 0900  BP: 126/76  Pulse: 87  Weight: 175 lb (79.4 kg)    Fetal Status: Fetal Heart Rate (bpm): 162   Movement: Absent     General:  Alert, oriented and cooperative. Patient is in no acute distress.  Skin: Skin is warm and dry. No rash noted.   Cardiovascular: Normal heart rate noted  Respiratory: Normal respiratory effort, no problems with respiration noted  Abdomen: Soft, gravid, appropriate for gestational age.  Pain/Pressure: Absent     Pelvic: Cervical exam deferred        Extremities: Normal range of motion.  Edema: None  Mental Status: Normal mood and affect. Normal behavior. Normal judgment and thought content.   Assessment and Plan:  Pregnancy: [id number] at [redacted]w[redacted]d 1. Supervision of other normal pregnancy, antepartum - Patient doing well, no complaints - Routine prenatal care - Anticipatory guidance on upcoming appointments - COVID19 precautions discussed  - genetic screening to be performed around 12 weeks  - [place] MFM OB DETAIL +14 WK; Future  2. History of gestational hypertension - Hx of GHTN in previous pregnancies, BP stable as of gestational age  - Rx for BASA sent to pharmacy at [facility] visit, discussed with patient to start taking medication on  Sunday- patient verbalizes understanding  - Encouraged to continue taking BP weekly and enter into babyscripts app  - [place] MFM OB DETAIL +14 WK; Future  Preterm labor symptoms and general obstetric precautions including but not limited to vaginal bleeding, contractions, leaking of fluid and fetal movement were reviewed in detail with the patient. Please refer to After Visit Summary for other counseling recommendations.   Return in about 8 weeks (around 12/02/2018) for ROB/AFP.  Future Appointments  Date Time Provider [place]  10/17/2018  9:00 AM [place], [place]

## 2018-10-07 NOTE — Patient Instructions (Signed)

## 2018-10-17 ENCOUNTER — Other Ambulatory Visit: Payer: Self-pay

## 2018-10-17 ENCOUNTER — Ambulatory Visit (INDEPENDENT_AMBULATORY_CARE_PROVIDER_SITE_OTHER): Payer: Medicaid Other | Admitting: *Deleted

## 2018-10-17 DIAGNOSIS — Z3480 Encounter for supervision of other normal pregnancy, unspecified trimester: Secondary | ICD-10-CM | POA: Diagnosis not present

## 2018-10-17 DIAGNOSIS — Z3482 Encounter for supervision of other normal pregnancy, second trimester: Secondary | ICD-10-CM | POA: Diagnosis not present

## 2018-10-17 NOTE — Progress Notes (Signed)
Pt here for NPITS only and does not want to know gender

## 2018-10-24 DIAGNOSIS — Z3480 Encounter for supervision of other normal pregnancy, unspecified trimester: Secondary | ICD-10-CM

## 2018-10-29 ENCOUNTER — Encounter: Payer: Self-pay | Admitting: *Deleted

## 2018-10-29 DIAGNOSIS — Z3480 Encounter for supervision of other normal pregnancy, unspecified trimester: Secondary | ICD-10-CM

## 2018-12-05 ENCOUNTER — Other Ambulatory Visit: Payer: Self-pay

## 2018-12-05 ENCOUNTER — Encounter: Payer: Self-pay | Admitting: Advanced Practice Midwife

## 2018-12-05 ENCOUNTER — Ambulatory Visit (INDEPENDENT_AMBULATORY_CARE_PROVIDER_SITE_OTHER): Payer: Medicaid Other | Admitting: Advanced Practice Midwife

## 2018-12-05 ENCOUNTER — Ambulatory Visit (HOSPITAL_COMMUNITY)
Admission: RE | Admit: 2018-12-05 | Discharge: 2018-12-05 | Disposition: A | Discharge status: Home / Self Care | Payer: Medicaid Other | Source: Ambulatory Visit | Attending: Certified Nurse Midwife | Admitting: Certified Nurse Midwife

## 2018-12-05 VITALS — BP 119/69 | HR 85 | Wt 189.0 lb

## 2018-12-05 DIAGNOSIS — O09292 Supervision of pregnancy with other poor reproductive or obstetric history, second trimester: Secondary | ICD-10-CM

## 2018-12-05 DIAGNOSIS — Z3A19 19 weeks gestation of pregnancy: Secondary | ICD-10-CM | POA: Diagnosis not present

## 2018-12-05 DIAGNOSIS — Z3480 Encounter for supervision of other normal pregnancy, unspecified trimester: Secondary | ICD-10-CM

## 2018-12-05 DIAGNOSIS — O99212 Obesity complicating pregnancy, second trimester: Secondary | ICD-10-CM

## 2018-12-05 DIAGNOSIS — Z8759 Personal history of other complications of pregnancy, childbirth and the puerperium: Secondary | ICD-10-CM | POA: Insufficient documentation

## 2018-12-05 DIAGNOSIS — Z23 Encounter for immunization: Secondary | ICD-10-CM | POA: Diagnosis not present

## 2018-12-05 DIAGNOSIS — Z3482 Encounter for supervision of other normal pregnancy, second trimester: Secondary | ICD-10-CM

## 2018-12-05 DIAGNOSIS — E669 Obesity, unspecified: Secondary | ICD-10-CM

## 2018-12-05 NOTE — Progress Notes (Signed)
   PRENATAL VISIT NOTE  Subjective:  Megan Powell is a 28 y.o. [phone] at [redacted]w[redacted]d being seen today for ongoing prenatal care.  She is currently monitored for the following issues for this low-risk pregnancy and has History of gestational hypertension; Vaginal delivery; and Supervision of other normal pregnancy, antepartum on their problem list.  Patient reports no complaints.  Contractions: Not present. Vag. Bleeding: None.  Movement: Present. Denies leaking of fluid.   The following portions of the patient's history were reviewed and updated as appropriate: allergies, current medications, past family history, past medical history, past social history, past surgical history and problem list.   Objective:   Vitals:   12/05/18 0841  BP: 119/69  Pulse: 85  Weight: 189 lb (85.7 kg)    Fetal Status: Fetal Heart Rate (bpm): 142 Fundal Height: 21 cm Movement: Present     General:  Alert, oriented and cooperative. Patient is in no acute distress.  Skin: Skin is warm and dry. No rash noted.   Cardiovascular: Normal heart rate noted  Respiratory: Normal respiratory effort, no problems with respiration noted  Abdomen: Soft, gravid, appropriate for gestational age.  Pain/Pressure: Absent     Pelvic: Cervical exam deferred        Extremities: Normal range of motion.  Edema: None  Mental Status: Normal mood and affect. Normal behavior. Normal judgment and thought content.   Assessment and Plan:  Pregnancy: G4P3003 at [redacted]w[redacted]d 1. Supervision of other normal pregnancy, antepartum - Routine care - Alpha fetoprotein, maternal  Preterm labor symptoms and general obstetric precautions including but not limited to vaginal bleeding, contractions, leaking of fluid and fetal movement were reviewed in detail with the patient. Please refer to After Visit Summary for other counseling recommendations.   Return in about 8 weeks (around 01/30/2019) for in person visit with 28 week labs and GTT .  Future  Appointments  Date Time Provider [place]  12/05/2018 11:00 AM [facility] [place] 3 [facility]    Heather Hogan DNP, CNM  12/05/18  9:06 AM

## 2018-12-08 LAB — ALPHA FETOPROTEIN, MATERNAL
AFP MoM: 0.85
AFP, Serum: 43.1 ng/mL
Calc'd Gestational Age: 19.9 weeks
Maternal Wt: 175 [lb_av]
Risk for ONTD: 1
Twins-AFP: 1

## 2019-01-29 MED ORDER — RHO D IMMUNE GLOBULIN 1500 UNIT/2ML IJ SOSY
300.0000 ug | PREFILLED_SYRINGE | Freq: Once | INTRAMUSCULAR | Status: AC
  Administered 2019-01-30: 300 ug via INTRAMUSCULAR

## 2019-01-30 ENCOUNTER — Ambulatory Visit (INDEPENDENT_AMBULATORY_CARE_PROVIDER_SITE_OTHER): Payer: Medicaid Other | Admitting: Certified Nurse Midwife

## 2019-01-30 ENCOUNTER — Other Ambulatory Visit: Payer: Self-pay

## 2019-01-30 VITALS — BP 122/68 | HR 82 | Wt 201.0 lb

## 2019-01-30 DIAGNOSIS — O360120 Maternal care for anti-D [Rh] antibodies, second trimester, not applicable or unspecified: Secondary | ICD-10-CM

## 2019-01-30 DIAGNOSIS — Z3A27 27 weeks gestation of pregnancy: Secondary | ICD-10-CM | POA: Diagnosis not present

## 2019-01-30 DIAGNOSIS — O26849 Uterine size-date discrepancy, unspecified trimester: Secondary | ICD-10-CM

## 2019-01-30 DIAGNOSIS — Z3480 Encounter for supervision of other normal pregnancy, unspecified trimester: Secondary | ICD-10-CM

## 2019-01-30 DIAGNOSIS — Z6791 Unspecified blood type, Rh negative: Secondary | ICD-10-CM

## 2019-01-30 DIAGNOSIS — O26842 Uterine size-date discrepancy, second trimester: Secondary | ICD-10-CM

## 2019-01-30 DIAGNOSIS — Z8759 Personal history of other complications of pregnancy, childbirth and the puerperium: Secondary | ICD-10-CM

## 2019-01-30 DIAGNOSIS — Z23 Encounter for immunization: Secondary | ICD-10-CM

## 2019-01-30 HISTORY — DX: Uterine size-date discrepancy, unspecified trimester: O26.849

## 2019-01-30 NOTE — Progress Notes (Signed)
   PRENATAL VISIT NOTE  Subjective:  Megan Powell is a 28 y.o. [phone] at [redacted]w[redacted]d being seen today for ongoing prenatal care.  She is currently monitored for the following issues for this low-risk pregnancy and has History of gestational hypertension; Vaginal delivery; and Supervision of other normal pregnancy, antepartum on their problem list.  Patient reports no complaints.  Contractions: Not present. Vag. Bleeding: None.  Movement: Present. Denies leaking of fluid.   The following portions of the patient's history were reviewed and updated as appropriate: allergies, current medications, past family history, past medical history, past social history, past surgical history and problem list.   Objective:   Vitals:   01/30/19 0846  BP: 122/68  Pulse: 82  Weight: 91.2 kg    Fetal Status: Fetal Heart Rate (bpm): 128 Fundal Height: 33 cm Movement: Present     General:  Alert, oriented and cooperative. Patient is in no acute distress.  Skin: Skin is warm and dry. No rash noted.   Cardiovascular: Normal heart rate noted  Respiratory: Normal respiratory effort, no problems with respiration noted  Abdomen: Soft, gravid, appropriate for gestational age.  Pain/Pressure: Absent     Pelvic: Cervical exam deferred        Extremities: Normal range of motion.  Edema: None  Mental Status: Normal mood and affect. Normal behavior. Normal judgment and thought content.   Assessment and Plan:  Pregnancy: [id number] at [redacted]w[redacted]d 1. Supervision of other normal pregnancy, antepartum - Pt doing well today - No complaints - Routine prenatal care with GTT and labs today  - 2 Hour GTT - HIV antibody (with reflex) - RPR - CBC - Antibody screen; Future - Antibody screen  2. Rh negative status during pregnancy in third trimester - Rhogam today  3. Significant discrepancy between uterine size and clinical dates, antepartum - Fundal height measuring 33 cm at 27.6 weeks - Growth ultrasound ordered   - [place]  MFM OB FOLLOW UP; Future   Preterm labor symptoms and general obstetric precautions including but not limited to vaginal bleeding, contractions, leaking of fluid and fetal movement were reviewed in detail with the patient. Please refer to After Visit Summary for other counseling recommendations.   Return in about 4 weeks (around 02/27/2019).  Future Appointments  Date Time Provider [place]  02/25/2019  9:30 AM Emily Filbert, MD [facility]    Maryagnes Amos, SNM

## 2019-01-30 NOTE — Patient Instructions (Signed)
Third Trimester of Pregnancy The third trimester is from week 28 through week 40 (months 7 through 9). The third trimester is a time when the unborn baby (fetus) is growing rapidly. At the end of the ninth month, the fetus is about 20 inches in length and weighs 6-10 pounds. Body changes during your third trimester Your body will continue to go through many changes during pregnancy. The changes vary from woman to woman. During the third trimester:  Your weight will continue to increase. You can expect to gain 25-35 pounds (11-16 kg) by the end of the pregnancy.  You may begin to get stretch marks on your hips, abdomen, and breasts.  You may urinate more often because the fetus is moving lower into your pelvis and pressing on your bladder.  You may develop or continue to have heartburn. This is caused by increased hormones that slow down muscles in the digestive tract.  You may develop or continue to have constipation because increased hormones slow digestion and cause the muscles that push waste through your intestines to relax.  You may develop hemorrhoids. These are swollen veins (varicose veins) in the rectum that can itch or be painful.  You may develop swollen, bulging veins (varicose veins) in your legs.  You may have increased body aches in the pelvis, back, or thighs. This is due to weight gain and increased hormones that are relaxing your joints.  You may have changes in your hair. These can include thickening of your hair, rapid growth, and changes in texture. Some women also have hair loss during or after pregnancy, or hair that feels dry or thin. Your hair will most likely return to normal after your baby is born.  Your breasts will continue to grow and they will continue to become tender. A yellow fluid (colostrum) may leak from your breasts. This is the first milk you are producing for your baby.  Your belly button may stick out.  You may notice more swelling in your hands,  face, or ankles.  You may have increased tingling or numbness in your hands, arms, and legs. The skin on your belly may also feel numb.  You may feel short of breath because of your expanding uterus.  You may have more problems sleeping. This can be caused by the size of your belly, increased need to urinate, and an increase in your body's metabolism.  You may notice the fetus "dropping," or moving lower in your abdomen (lightening).  You may have increased vaginal discharge.  You may notice your joints feel loose and you may have pain around your pelvic bone. What to expect at prenatal visits You will have prenatal exams every 2 weeks until week 36. Then you will have weekly prenatal exams. During a routine prenatal visit:  You will be weighed to make sure you and the baby are growing normally.  Your blood pressure will be taken.  Your abdomen will be measured to track your baby's growth.  The fetal heartbeat will be listened to.  Any test results from the previous visit will be discussed.  You may have a cervical check near your due date to see if your cervix has softened or thinned (effaced).  You will be tested for Group B streptococcus. This happens between 35 and 37 weeks. Your health care provider may ask you:  What your birth plan is.  How you are feeling.  If you are feeling the baby move.  If you have had any abnormal   symptoms, such as leaking fluid, bleeding, severe headaches, or abdominal cramping.  If you are using any tobacco products, including cigarettes, chewing tobacco, and electronic cigarettes.  If you have any questions. Other tests or screenings that may be performed during your third trimester include:  Blood tests that check for low iron levels (anemia).  Fetal testing to check the health, activity level, and growth of the fetus. Testing is done if you have certain medical conditions or if there are problems during the pregnancy.  Nonstress test  (NST). This test checks the health of your baby to make sure there are no signs of problems, such as the baby not getting enough oxygen. During this test, a belt is placed around your belly. The baby is made to move, and its heart rate is monitored during movement. What is false labor? False labor is a condition in which you feel small, irregular tightenings of the muscles in the womb (contractions) that usually go away with rest, changing position, or drinking water. These are called Braxton Hicks contractions. Contractions may last for hours, days, or even weeks before true labor sets in. If contractions come at regular intervals, become more frequent, increase in intensity, or become painful, you should see your health care provider. What are the signs of labor?  Abdominal cramps.  Regular contractions that start at 10 minutes apart and become stronger and more frequent with time.  Contractions that start on the top of the uterus and spread down to the lower abdomen and back.  Increased pelvic pressure and dull back pain.  A watery or bloody mucus discharge that comes from the vagina.  Leaking of amniotic fluid. This is also known as your "water breaking." It could be a slow trickle or a gush. Let your health care provider know if it has a color or strange odor. If you have any of these signs, call your health care provider right away, even if it is before your due date. Follow these instructions at home: Medicines  Follow your health care provider's instructions regarding medicine use. Specific medicines may be either safe or unsafe to take during pregnancy.  Take a prenatal vitamin that contains at least 600 micrograms (mcg) of folic acid.  If you develop constipation, try taking a stool softener if your health care provider approves. Eating and drinking   Eat a balanced diet that includes fresh fruits and vegetables, whole grains, good sources of protein such as meat, eggs, or tofu,  and low-fat dairy. Your health care provider will help you determine the amount of weight gain that is right for you.  Avoid raw meat and uncooked cheese. These carry germs that can cause birth defects in the baby.  If you have low calcium intake from food, talk to your health care provider about whether you should take a daily calcium supplement.  Eat four or five small meals rather than three large meals a day.  Limit foods that are high in fat and processed sugars, such as fried and sweet foods.  To prevent constipation: ? Drink enough fluid to keep your urine clear or pale yellow. ? Eat foods that are high in fiber, such as fresh fruits and vegetables, whole grains, and beans. Activity  Exercise only as directed by your health care provider. Most women can continue their usual exercise routine during pregnancy. Try to exercise for 30 minutes at least 5 days a week. Stop exercising if you experience uterine contractions.  Avoid heavy lifting.  Do   not exercise in extreme heat or humidity, or at high altitudes.  Wear low-heel, comfortable shoes.  Practice good posture.  You may continue to have sex unless your health care provider tells you otherwise. Relieving pain and discomfort  Take frequent breaks and rest with your legs elevated if you have leg cramps or low back pain.  Take warm sitz baths to soothe any pain or discomfort caused by hemorrhoids. Use hemorrhoid cream if your health care provider approves.  Wear a good support bra to prevent discomfort from breast tenderness.  If you develop varicose veins: ? Wear support pantyhose or compression stockings as told by your healthcare provider. ? Elevate your feet for 15 minutes, 3-4 times a day. Prenatal care  Write down your questions. Take them to your prenatal visits.  Keep all your prenatal visits as told by your health care provider. This is important. Safety  Wear your seat belt at all times when driving.  Make  a list of emergency phone numbers, including numbers for family, friends, the hospital, and police and fire departments. General instructions  Avoid cat litter boxes and soil used by cats. These carry germs that can cause birth defects in the baby. If you have a cat, ask someone to clean the litter box for you.  Do not travel far distances unless it is absolutely necessary and only with the approval of your health care provider.  Do not use hot tubs, steam rooms, or saunas.  Do not drink alcohol.  Do not use any products that contain nicotine or tobacco, such as cigarettes and e-cigarettes. If you need help quitting, ask your health care provider.  Do not use any medicinal herbs or unprescribed drugs. These chemicals affect the formation and growth of the baby.  Do not douche or use tampons or scented sanitary pads.  Do not cross your legs for long periods of time.  To prepare for the arrival of your baby: ? Take prenatal classes to understand, practice, and ask questions about labor and delivery. ? Make a trial run to the hospital. ? Visit the hospital and tour the maternity area. ? Arrange for maternity or paternity leave through employers. ? Arrange for family and friends to take care of pets while you are in the hospital. ? Purchase a rear-facing car seat and make sure you know how to install it in your car. ? Pack your hospital bag. ? Prepare the baby's nursery. Make sure to remove all pillows and stuffed animals from the baby's crib to prevent suffocation.  Visit your dentist if you have not gone during your pregnancy. Use a soft toothbrush to brush your teeth and be gentle when you floss. Contact a health care provider if:  You are unsure if you are in labor or if your water has broken.  You become dizzy.  You have mild pelvic cramps, pelvic pressure, or nagging pain in your abdominal area.  You have lower back pain.  You have persistent nausea, vomiting, or diarrhea.   You have an unusual or bad smelling vaginal discharge.  You have pain when you urinate. Get help right away if:  Your water breaks before 37 weeks.  You have regular contractions less than 5 minutes apart before 37 weeks.  You have a fever.  You are leaking fluid from your vagina.  You have spotting or bleeding from your vagina.  You have severe abdominal pain or cramping.  You have rapid weight loss or weight gain.  You have  shortness of breath with chest pain.  You notice sudden or extreme swelling of your face, hands, ankles, feet, or legs.  Your baby makes fewer than 10 movements in 2 hours.  You have severe headaches that do not go away when you take medicine.  You have vision changes. Summary  The third trimester is from week 28 through week 40, months 7 through 9. The third trimester is a time when the unborn baby (fetus) is growing rapidly.  During the third trimester, your discomfort may increase as you and your baby continue to gain weight. You may have abdominal, leg, and back pain, sleeping problems, and an increased need to urinate.  During the third trimester your breasts will keep growing and they will continue to become tender. A yellow fluid (colostrum) may leak from your breasts. This is the first milk you are producing for your baby.  False labor is a condition in which you feel small, irregular tightenings of the muscles in the womb (contractions) that eventually go away. These are called Braxton Hicks contractions. Contractions may last for hours, days, or even weeks before true labor sets in.  Signs of labor can include: abdominal cramps; regular contractions that start at 10 minutes apart and become stronger and more frequent with time; watery or bloody mucus discharge that comes from the vagina; increased pelvic pressure and dull back pain; and leaking of amniotic fluid. This information is not intended to replace advice given to you by your health  care provider. Make sure you discuss any questions you have with your health care provider. Document Released: 02/06/2001 Document Revised: 06/05/2018 Document Reviewed: 03/20/2016 Elsevier Patient Education  2020 [employer].

## 2019-02-02 LAB — CBC
HCT: 33.3 % — ABNORMAL LOW (ref 35.0–45.0)
Hemoglobin: 11 g/dL — ABNORMAL LOW (ref 11.7–15.5)
MCH: 29.1 pg (ref 27.0–33.0)
MCHC: 33 g/dL (ref 32.0–36.0)
MCV: 88.1 fL (ref 80.0–100.0)
MPV: 10.9 fL (ref 7.5–12.5)
Platelets: 211 10*3/uL (ref 140–400)
RBC: 3.78 10*6/uL — ABNORMAL LOW (ref 3.80–5.10)
RDW: 13.3 % (ref 11.0–15.0)
WBC: 9.5 10*3/uL (ref 3.8–10.8)

## 2019-02-02 LAB — 2HR GTT W 1 HR, CARPENTER, 75 G
Glucose, 1 Hr, Gest: 88 mg/dL (ref 65–179)
Glucose, 2 Hr, Gest: 68 mg/dL (ref 65–152)
Glucose, Fasting, Gest: 74 mg/dL (ref 65–91)

## 2019-02-02 LAB — HIV ANTIBODY (ROUTINE TESTING W REFLEX): HIV 1&2 Ab, 4th Generation: NONREACTIVE

## 2019-02-02 LAB — RPR: RPR Ser Ql: NONREACTIVE

## 2019-02-02 LAB — ANTIBODY SCREEN: Antibody Screen: NOT DETECTED

## 2019-02-13 ENCOUNTER — Other Ambulatory Visit (HOSPITAL_COMMUNITY): Payer: Self-pay | Admitting: *Deleted

## 2019-02-13 ENCOUNTER — Other Ambulatory Visit: Payer: Self-pay

## 2019-02-13 ENCOUNTER — Ambulatory Visit (HOSPITAL_COMMUNITY)
Admission: RE | Admit: 2019-02-13 | Discharge: 2019-02-13 | Disposition: A | Discharge status: Home / Self Care | Payer: Medicaid Other | Source: Ambulatory Visit | Attending: Obstetrics and Gynecology | Admitting: Obstetrics and Gynecology

## 2019-02-13 DIAGNOSIS — O99213 Obesity complicating pregnancy, third trimester: Secondary | ICD-10-CM

## 2019-02-13 DIAGNOSIS — O26849 Uterine size-date discrepancy, unspecified trimester: Secondary | ICD-10-CM

## 2019-02-13 DIAGNOSIS — Z8759 Personal history of other complications of pregnancy, childbirth and the puerperium: Secondary | ICD-10-CM | POA: Insufficient documentation

## 2019-02-13 DIAGNOSIS — E669 Obesity, unspecified: Secondary | ICD-10-CM | POA: Diagnosis not present

## 2019-02-13 DIAGNOSIS — Z362 Encounter for other antenatal screening follow-up: Secondary | ICD-10-CM

## 2019-02-13 DIAGNOSIS — Z3480 Encounter for supervision of other normal pregnancy, unspecified trimester: Secondary | ICD-10-CM | POA: Insufficient documentation

## 2019-02-13 DIAGNOSIS — O09293 Supervision of pregnancy with other poor reproductive or obstetric history, third trimester: Secondary | ICD-10-CM | POA: Diagnosis not present

## 2019-02-13 DIAGNOSIS — O26843 Uterine size-date discrepancy, third trimester: Secondary | ICD-10-CM

## 2019-02-13 DIAGNOSIS — Z3A29 29 weeks gestation of pregnancy: Secondary | ICD-10-CM

## 2019-02-13 DIAGNOSIS — O99210 Obesity complicating pregnancy, unspecified trimester: Secondary | ICD-10-CM

## 2019-02-25 ENCOUNTER — Other Ambulatory Visit: Payer: Self-pay | Admitting: Obstetrics & Gynecology

## 2019-02-25 ENCOUNTER — Telehealth (INDEPENDENT_AMBULATORY_CARE_PROVIDER_SITE_OTHER): Payer: Medicaid Other | Admitting: Obstetrics & Gynecology

## 2019-02-25 VITALS — BP 118/78 | Wt 206.0 lb

## 2019-02-25 DIAGNOSIS — Z3A31 31 weeks gestation of pregnancy: Secondary | ICD-10-CM

## 2019-02-25 DIAGNOSIS — O133 Gestational [pregnancy-induced] hypertension without significant proteinuria, third trimester: Secondary | ICD-10-CM

## 2019-02-25 DIAGNOSIS — O2600 Excessive weight gain in pregnancy, unspecified trimester: Secondary | ICD-10-CM | POA: Insufficient documentation

## 2019-02-25 DIAGNOSIS — Z8759 Personal history of other complications of pregnancy, childbirth and the puerperium: Secondary | ICD-10-CM

## 2019-02-25 DIAGNOSIS — O2603 Excessive weight gain in pregnancy, third trimester: Secondary | ICD-10-CM

## 2019-02-25 DIAGNOSIS — Z3480 Encounter for supervision of other normal pregnancy, unspecified trimester: Secondary | ICD-10-CM

## 2019-02-25 MED ORDER — OMEPRAZOLE 20 MG PO CPDR: 20.0000 mg | DELAYED_RELEASE_CAPSULE | Freq: Every day | ORAL | 6 refills | Status: DC

## 2019-02-25 MED ORDER — IRON 90 (18 FE) MG PO TABS: 1.0000 | ORAL_TABLET | Freq: Every day | ORAL | 3 refills | Status: DC

## 2019-02-25 NOTE — Progress Notes (Signed)
Megan Powell  Provider location: [facility] at [place]   I connected with Megan Powell on 02/25/19 at  9:30 AM EST by MyChart Video Encounter at home and verified that I am speaking with the correct person using two identifiers.   I discussed the limitations, risks, security and privacy concerns of performing an evaluation and management service virtually and the availability of in person appointments. I also discussed with the patient that there may be a patient responsible charge related to this service. The patient expressed understanding and agreed to proceed. Subjective:  Megan Powell is a 28 y.o. [phone] at [redacted]w[redacted]d being seen today for ongoing prenatal care.  She is currently monitored for the following issues for this high-risk pregnancy and has Gestational hypertension, third trimester; History of gestational hypertension; Supervision of other normal pregnancy, antepartum; Significant discrepancy between uterine size and clinical dates, antepartum; and Excessive weight gain affecting pregnancy on their problem list.  Patient reports no complaints except for craving ice and heartburn. She has used prilosec in the past and is requesting a prescription.  Contractions: Not present. Vag. Bleeding: None.  Movement: Present. Denies any leaking of fluid.   The following portions of the patient's history were reviewed and updated as appropriate: allergies, current medications, past family history, past medical history, past social history, past surgical history and problem list.   Objective:   Vitals:   02/25/19 0854  BP: 118/78    Fetal Status:     Movement: Present     General:  Alert, oriented and cooperative. Patient is in no acute distress.  Respiratory: Normal respiratory effort, no problems with respiration noted  Mental Status: Normal mood and affect. Normal behavior. Normal judgment and thought content.    Rest of physical exam deferred due to type of encounter  Imaging: [place] MFM OB FOLLOW UP  Result Date: 02/13/2019 ----------------------------------------------------------------------  OBSTETRICS REPORT                       (Signed Final 02/13/2019 09:37 am) ---------------------------------------------------------------------- Patient Info  ID #:       [id number]                          D.O.B.:  05/16/1990 (28 yrs)  Name:       Megan Powell                   Visit Date: 02/13/2019 08:22 am ---------------------------------------------------------------------- Performed By  Performed By:     Valda Favia          Ref. Address:     Faculty                    RDMS  Attending:        Tama High MD        [facility]  Referred By:      Katheran James  ROGERS CNM ---------------------------------------------------------------------- Orders   #  Description                          Code         Ordered By   1  [place] MFM OB FOLLOW UP                  [id number]     MELANIE BHAMBRI  ----------------------------------------------------------------------   #  Order #                    Accession #                 Episode #   1  [id number]                  [id number]                  [id number]  ---------------------------------------------------------------------- Indications   [redacted] weeks gestation of pregnancy                Z3A.29   Encounter for antenatal screening for          Z36.3   malformations (neg horizon, pending AFP)   Poor obstetric history: Previous               O09.299   preeclampsia / eclampsia/gestational HTN   Obesity complicating pregnancy                 O99.210 E66.9   Uterine size-date discrepancy, third trimester O26.843  ---------------------------------------------------------------------- Vital Signs                                                 Height:        5'4"  ---------------------------------------------------------------------- Fetal Evaluation  Num Of Fetuses:         1  Fetal Heart Rate(bpm):  132  Cardiac Activity:       Observed  Presentation:           Cephalic  Placenta:               Posterior  P. Cord Insertion:      Previously Visualized  Amniotic Fluid  AFI FV:      Within normal limits  AFI Sum(cm)     %Tile       Largest Pocket(cm)  11              21          4.21  RUQ(cm)       RLQ(cm)       LUQ(cm)        LLQ(cm)  0             4.21          2.77           4.02 ---------------------------------------------------------------------- Biometry  BPD:      79.6  mm     G. Age:  32w 0d         92  %    CI:         76.1   %    70 - 86  FL/HC:      19.2   %    19.2 - 21.4  HC:      289.2  mm     G. Age:  31w 6d         73  %    HC/AC:      1.02        0.99 - 1.21  AC:      282.8  mm     G. Age:  32w 2d         96  %    FL/BPD:     69.6   %    71 - 87  FL:       55.4  mm     G. Age:  29w 1d         19  %    FL/AC:      19.6   %    20 - 24  HUM:      51.7  mm     G. Age:  30w 1d         58  %  Est. FW:    1739  gm    3 lb 13 oz      86  % ---------------------------------------------------------------------- OB History  Gravidity:    4         Term:   3 ---------------------------------------------------------------------- Gestational Age  LMP:           29w 6d        Date:  07/19/18                 EDD:   04/25/19  U/S Today:     31w 2d                                        EDD:   04/15/19  Best:          29w 6d     Det. By:  LMP  (07/19/18)          EDD:   04/25/19 ---------------------------------------------------------------------- Anatomy  Cranium:               Appears normal         LVOT:                   Appears normal  Cavum:                 Previously seen        Aortic Arch:            Previously seen  Ventricles:            Previously seen        Ductal Arch:            Previously seen  Choroid  Plexus:        Previously seen        Diaphragm:              Previously seen  Cerebellum:            Previously seen        Stomach:                Appears normal, left  sided  Posterior Fossa:       Previously seen        Abdomen:                Previously seen  Nuchal Fold:           Previously seen        Abdominal Wall:         Previously seen  Face:                  Orbits and profile     Cord Vessels:           Previously seen                         previously seen  Lips:                  Previously seen        Kidneys:                Appear normal  Palate:                Previously seen        Bladder:                Appears normal  Thoracic:              Appears normal         Spine:                  Previously seen  Heart:                 Previously seen        Upper Extremities:      Previously seen  RVOT:                  Appears normal         Lower Extremities:      Previously seen  Other:  Parents do not wish to know sex of fetus. Heels and 5th digit          visualized previously. Nasal bone visualized previously. ---------------------------------------------------------------------- Cervix Uterus Adnexa  Cervix  Not visualized (advanced GA >24wks)  Uterus  No abnormality visualized.  Left Ovary  No adnexal mass visualized.  Right Ovary  No adnexal mass visualized.  Cul De Sac  No free fluid seen.  Adnexa  No abnormality visualized. ---------------------------------------------------------------------- Impression  Suspected large for gestational age fetus on clinical  examination.  Patient is here for fetal growth assessment.  Amniotic fluid is normal and good fetal activity is seen. Fetal  growth is appropriate for gestational age.  She does not have gestational diabetes.  Patient reports all her previous children weighed more than 8  pounds at birth.  She had a term vaginal deliveries.  We reassured the patient of the findings.   Ultrasound has  limitations and accurately estimating fetal weights. ---------------------------------------------------------------------- Recommendations  -An appointment was made for her to return in 6 weeks for  fetal growth assessment. ----------------------------------------------------------------------                  Tama High, MD Electronically Signed Final Report   02/13/2019 09:37 am ----------------------------------------------------------------------   Assessment and Plan:  Pregnancy: [id number] at [redacted]w[redacted]d 1. Supervision of other normal pregnancy, antepartum  2. History of gestational hypertension - I have encouraged her to take baby asa, she is not taking  any meds  4. Excessive weight gain affecting pregnancy - normal 2 hour GTT  Preterm labor symptoms and general obstetric precautions including but not limited to vaginal bleeding, contractions, leaking of fluid and fetal movement were reviewed in detail with the patient. I discussed the assessment and treatment plan with the patient. The patient was provided an opportunity to ask questions and all were answered. The patient agreed with the plan and demonstrated an understanding of the instructions. The patient was advised to call back or seek an in-person office evaluation/go to [facility] at [facility] for any urgent or concerning symptoms. Please refer to After Visit Summary for other counseling recommendations.   I provided 10 minutes of face-to-face time during this encounter.  No follow-ups on file.  Future Appointments  Date Time Provider [place]  02/25/2019  9:30 AM Emily Filbert, MD [facility]  03/27/2019  7:45 AM [place] 2 [facility]    Emily Filbert, [place] for [facility], [place]

## 2019-02-27 NOTE — L&D Delivery Note (Signed)
Delivery Note At 75 a viable female infant was delivered via SVD, presentation: OA. APGAR: 9, 9; weight 9'0.   Placenta status: spontaneously delivered intact with gentle cord traction. Fundus firm with massage and Pitocin.   Anesthesia: epidural Lacerations: none Est. Blood Loss (mL): 50 Placenta to LD Complications none Cord ph n/a   Mom to postpartum. Baby to Couplet care / Skin to Skin.    Julianne Handler, [place] 04/24/2019 2:27 PM

## 2019-03-12 DIAGNOSIS — Z20828 Contact with and (suspected) exposure to other viral communicable diseases: Secondary | ICD-10-CM | POA: Diagnosis not present

## 2019-03-13 ENCOUNTER — Telehealth (INDEPENDENT_AMBULATORY_CARE_PROVIDER_SITE_OTHER): Payer: Medicaid Other | Admitting: Certified Nurse Midwife

## 2019-03-13 VITALS — BP 109/76 | Wt 207.0 lb

## 2019-03-13 DIAGNOSIS — Z8759 Personal history of other complications of pregnancy, childbirth and the puerperium: Secondary | ICD-10-CM

## 2019-03-13 DIAGNOSIS — Z3A33 33 weeks gestation of pregnancy: Secondary | ICD-10-CM

## 2019-03-13 DIAGNOSIS — Z3480 Encounter for supervision of other normal pregnancy, unspecified trimester: Secondary | ICD-10-CM

## 2019-03-13 DIAGNOSIS — O26849 Uterine size-date discrepancy, unspecified trimester: Secondary | ICD-10-CM

## 2019-03-13 DIAGNOSIS — O26843 Uterine size-date discrepancy, third trimester: Secondary | ICD-10-CM

## 2019-03-13 NOTE — Progress Notes (Signed)
TELEHEALTH OBSTETRICS PRENATAL VIRTUAL VIDEO VISIT ENCOUNTER NOTE  Provider location: [facility] [place]   I connected with Megan Powell on 03/13/19 at  8:30 AM EST by MyChart Video Encounter at home and verified that I am speaking with the correct person using two identifiers.   I discussed the limitations, risks, security and privacy concerns of performing an evaluation and management service virtually and the availability of in person appointments. I also discussed with the patient that there may be a patient responsible charge related to this service. The patient expressed understanding and agreed to proceed. Subjective:  Megan Powell is a 29 y.o. [phone] at [redacted]w[redacted]d being seen today for ongoing prenatal care.  She is currently monitored for the following issues for this low-risk pregnancy and has History of gestational hypertension; Supervision of other normal pregnancy, antepartum; Significant discrepancy between uterine size and clinical dates, antepartum; and Excessive weight gain affecting pregnancy on their problem list.  Patient reports no complaints.  Contractions: Not present. Vag. Bleeding: None.  Movement: Present. Denies any leaking of fluid.   The following portions of the patient's history were reviewed and updated as appropriate: allergies, current medications, past family history, past medical history, past social history, past surgical history and problem list.   Objective:   Vitals:   03/13/19 0857  BP: 109/76  Weight: 207 lb (93.9 kg)    Fetal Status:     Movement: Present     General:  Alert, oriented and cooperative. Patient is in no acute distress.  Respiratory: Normal respiratory effort, no problems with respiration noted  Mental Status: Normal mood and affect. Normal behavior. Normal judgment and thought content.  Rest of physical exam deferred due to type of encounter  Imaging: [place] MFM OB FOLLOW UP  Result Date:  02/13/2019 ----------------------------------------------------------------------  OBSTETRICS REPORT                       (Signed Final 02/13/2019 09:37 am) ---------------------------------------------------------------------- Patient Info  ID #:       [id number]                          D.O.B.:  1990-11-19 (28 yrs)  Name:       Megan Powell                   Visit Date: 02/13/2019 08:22 am ---------------------------------------------------------------------- Performed By  Performed By:     Valda Favia          Ref. Address:     Faculty                    RDMS  Attending:        Tama High MD        [facility]  Referred By:      Lajean Manes CNM ---------------------------------------------------------------------- Orders   #  Description  Code         Ordered By   1  [place] MFM OB FOLLOW UP                  [id number]     Marthann Abshier  ----------------------------------------------------------------------   #  Order #                    Accession #                 Episode #   1  [id number]                  [id number]                  [id number]  ---------------------------------------------------------------------- Indications   [redacted] weeks gestation of pregnancy                Z3A.29   Encounter for antenatal screening for          Z36.3   malformations (neg horizon, pending AFP)   Poor obstetric history: Previous               O09.299   preeclampsia / eclampsia/gestational HTN   Obesity complicating pregnancy                 O99.210 E66.9   Uterine size-date discrepancy, third trimester O26.843  ---------------------------------------------------------------------- Vital Signs                                                 Height:        5'4" ---------------------------------------------------------------------- Fetal Evaluation  Num Of Fetuses:         1  Fetal Heart  Rate(bpm):  132  Cardiac Activity:       Observed  Presentation:           Cephalic  Placenta:               Posterior  P. Cord Insertion:      Previously Visualized  Amniotic Fluid  AFI FV:      Within normal limits  AFI Sum(cm)     %Tile       Largest Pocket(cm)  11              21          4.21  RUQ(cm)       RLQ(cm)       LUQ(cm)        LLQ(cm)  0             4.21          2.77           4.02 ---------------------------------------------------------------------- Biometry  BPD:      79.6  mm     G. Age:  32w 0d         92  %    CI:         76.1   %    70 - 86  FL/HC:      19.2   %    19.2 - 21.4  HC:      289.2  mm     G. Age:  31w 6d         73  %    HC/AC:      1.02        0.99 - 1.21  AC:      282.8  mm     G. Age:  32w 2d         96  %    FL/BPD:     69.6   %    71 - 87  FL:       55.4  mm     G. Age:  29w 1d         19  %    FL/AC:      19.6   %    20 - 24  HUM:      51.7  mm     G. Age:  30w 1d         58  %  Est. FW:    1739  gm    3 lb 13 oz      86  % ---------------------------------------------------------------------- OB History  Gravidity:    4         Term:   3 ---------------------------------------------------------------------- Gestational Age  LMP:           29w 6d        Date:  07/19/18                 EDD:   04/25/19  U/S Today:     31w 2d                                        EDD:   04/15/19  Best:          29w 6d     Det. By:  LMP  (07/19/18)          EDD:   04/25/19 ---------------------------------------------------------------------- Anatomy  Cranium:               Appears normal         LVOT:                   Appears normal  Cavum:                 Previously seen        Aortic Arch:            Previously seen  Ventricles:            Previously seen        Ductal Arch:            Previously seen  Choroid Plexus:        Previously seen        Diaphragm:              Previously seen  Cerebellum:            Previously seen         Stomach:                Appears normal, left  sided  Posterior Fossa:       Previously seen        Abdomen:                Previously seen  Nuchal Fold:           Previously seen        Abdominal Wall:         Previously seen  Face:                  Orbits and profile     Cord Vessels:           Previously seen                         previously seen  Lips:                  Previously seen        Kidneys:                Appear normal  Palate:                Previously seen        Bladder:                Appears normal  Thoracic:              Appears normal         Spine:                  Previously seen  Heart:                 Previously seen        Upper Extremities:      Previously seen  RVOT:                  Appears normal         Lower Extremities:      Previously seen  Other:  Parents do not wish to know sex of fetus. Heels and 5th digit          visualized previously. Nasal bone visualized previously. ---------------------------------------------------------------------- Cervix Uterus Adnexa  Cervix  Not visualized (advanced GA >24wks)  Uterus  No abnormality visualized.  Left Ovary  No adnexal mass visualized.  Right Ovary  No adnexal mass visualized.  Cul De Sac  No free fluid seen.  Adnexa  No abnormality visualized. ---------------------------------------------------------------------- Impression  Suspected large for gestational age fetus on clinical  examination.  Patient is here for fetal growth assessment.  Amniotic fluid is normal and good fetal activity is seen. Fetal  growth is appropriate for gestational age.  She does not have gestational diabetes.  Patient reports all her previous children weighed more than 8  pounds at birth.  She had a term vaginal deliveries.  We reassured the patient of the findings.  Ultrasound has  limitations and accurately estimating fetal weights.  ---------------------------------------------------------------------- Recommendations  -An appointment was made for her to return in 6 weeks for  fetal growth assessment. ----------------------------------------------------------------------                  Tama High, MD Electronically Signed Final Report   02/13/2019 09:37 am ----------------------------------------------------------------------   Assessment and Plan:  Pregnancy: [id number] at [redacted]w[redacted]d 1. Significant discrepancy between uterine size and clinical dates, antepartum - recent growth [place], measuring 86%ile - hx of larger babies, all with uncomplicated SVD -  repeat growth [place] in 2 wks  2. Supervision of other normal pregnancy, antepartum  3. History of gestational hypertension - has not been taking BP at home- strongly encouraged her to start weekly and upload to [facility] - has not been taking bASA- again strongly encouraged her to start to reduce risk of PEC  Preterm labor symptoms and general obstetric precautions including but not limited to vaginal bleeding, contractions, leaking of fluid and fetal movement were reviewed in detail with the patient. I discussed the assessment and treatment plan with the patient. The patient was provided an opportunity to ask questions and all were answered. The patient agreed with the plan and demonstrated an understanding of the instructions. The patient was advised to call back or seek an in-person office evaluation/go to [facility]U at [facility] for any urgent or concerning symptoms. Please refer to After Visit Summary for other counseling recommendations.   I provided 11 minutes of face-to-face time during this encounter.  Return in about 2 weeks (around 03/27/2019).  Future Appointments  Date Time Provider [place]  03/27/2019  7:45 AM [facility]-MFC [place] 2 WH-MFCUS [facility]  03/27/2019  9:30 AM Lajean Manes, CNM [facility]    Julianne Handler, [place] [facility] [employer], [facility]

## 2019-03-13 NOTE — Patient Instructions (Signed)
Fetal Movement Counts Patient Name: ________________________________________________ Patient Due Date: ____________________ What is a fetal movement count?  A fetal movement count is the number of times that you feel your baby move during a certain amount of time. This may also be called a fetal kick count. A fetal movement count is recommended for every pregnant woman. You may be asked to start counting fetal movements as early as week 28 of your pregnancy. Pay attention to when your baby is most active. You may notice your baby's sleep and wake cycles. You may also notice things that make your baby move more. You should do a fetal movement count:  When your baby is normally most active.  At the same time each day. A good time to count movements is while you are resting, after having something to eat and drink. How do I count fetal movements? 1. Find a quiet, comfortable area. Sit, or lie down on your side. 2. Write down the date, the start time and stop time, and the number of movements that you felt between those two times. Take this information with you to your health care visits. 3. Write down your start time when you feel the first movement. 4. Count kicks, flutters, swishes, rolls, and jabs. You should feel at least 10 movements. 5. You may stop counting after you have felt 10 movements, or if you have been counting for 2 hours. Write down the stop time. 6. If you do not feel 10 movements in 2 hours, contact your health care provider for further instructions. Your health care provider may want to do additional tests to assess your baby's well-being. Contact a health care provider if:  You feel fewer than 10 movements in 2 hours.  Your baby is not moving like he or she usually does. Date: ____________ Start time: ____________ Stop time: ____________ Movements: ____________ Date: ____________ Start time: ____________ Stop time: ____________ Movements: ____________ Date: ____________  Start time: ____________ Stop time: ____________ Movements: ____________ Date: ____________ Start time: ____________ Stop time: ____________ Movements: ____________ Date: ____________ Start time: ____________ Stop time: ____________ Movements: ____________ Date: ____________ Start time: ____________ Stop time: ____________ Movements: ____________ Date: ____________ Start time: ____________ Stop time: ____________ Movements: ____________ Date: ____________ Start time: ____________ Stop time: ____________ Movements: ____________ Date: ____________ Start time: ____________ Stop time: ____________ Movements: ____________ This information is not intended to replace advice given to you by your health care provider. Make sure you discuss any questions you have with your health care provider. Document Revised: 10/02/2018 Document Reviewed: 10/02/2018 Elsevier Patient Education  2020 [employer] Inc[employer]  

## 2019-03-27 ENCOUNTER — Other Ambulatory Visit: Payer: Self-pay

## 2019-03-27 ENCOUNTER — Ambulatory Visit (INDEPENDENT_AMBULATORY_CARE_PROVIDER_SITE_OTHER): Payer: Medicaid Other | Admitting: Certified Nurse Midwife

## 2019-03-27 ENCOUNTER — Encounter: Payer: Self-pay | Admitting: Certified Nurse Midwife

## 2019-03-27 ENCOUNTER — Ambulatory Visit (HOSPITAL_COMMUNITY): Payer: Medicaid Other

## 2019-03-27 ENCOUNTER — Ambulatory Visit (HOSPITAL_COMMUNITY)
Admission: RE | Admit: 2019-03-27 | Discharge: 2019-03-27 | Disposition: A | Discharge status: Home / Self Care | Payer: Medicaid Other | Source: Ambulatory Visit | Attending: Obstetrics and Gynecology | Admitting: Obstetrics and Gynecology

## 2019-03-27 ENCOUNTER — Other Ambulatory Visit (HOSPITAL_COMMUNITY)
Admission: RE | Admit: 2019-03-27 | Discharge: 2019-03-27 | Disposition: A | Discharge status: Home / Self Care | Payer: Medicaid Other | Source: Ambulatory Visit | Attending: Certified Nurse Midwife | Admitting: Certified Nurse Midwife

## 2019-03-27 VITALS — BP 117/76 | HR 98 | Temp 98.3°F | Wt 210.0 lb

## 2019-03-27 DIAGNOSIS — O26843 Uterine size-date discrepancy, third trimester: Secondary | ICD-10-CM

## 2019-03-27 DIAGNOSIS — O09293 Supervision of pregnancy with other poor reproductive or obstetric history, third trimester: Secondary | ICD-10-CM | POA: Diagnosis not present

## 2019-03-27 DIAGNOSIS — Z3480 Encounter for supervision of other normal pregnancy, unspecified trimester: Secondary | ICD-10-CM | POA: Diagnosis not present

## 2019-03-27 DIAGNOSIS — O99213 Obesity complicating pregnancy, third trimester: Secondary | ICD-10-CM | POA: Diagnosis not present

## 2019-03-27 DIAGNOSIS — Z029 Encounter for administrative examinations, unspecified: Secondary | ICD-10-CM

## 2019-03-27 DIAGNOSIS — O26849 Uterine size-date discrepancy, unspecified trimester: Secondary | ICD-10-CM

## 2019-03-27 DIAGNOSIS — Z3A35 35 weeks gestation of pregnancy: Secondary | ICD-10-CM | POA: Diagnosis not present

## 2019-03-27 DIAGNOSIS — Z362 Encounter for other antenatal screening follow-up: Secondary | ICD-10-CM | POA: Diagnosis not present

## 2019-03-27 DIAGNOSIS — O99210 Obesity complicating pregnancy, unspecified trimester: Secondary | ICD-10-CM | POA: Diagnosis not present

## 2019-03-27 DIAGNOSIS — Z8759 Personal history of other complications of pregnancy, childbirth and the puerperium: Secondary | ICD-10-CM

## 2019-03-27 LAB — OB RESULTS CONSOLE GBS: GBS: POSITIVE

## 2019-03-27 NOTE — Progress Notes (Signed)
   PRENATAL VISIT NOTE  Subjective:  Megan Powell is a 29 y.o. [phone] at [redacted]w[redacted]d being seen today for ongoing prenatal care.  She is currently monitored for the following issues for this low-risk pregnancy and has History of gestational hypertension; Supervision of other normal pregnancy, antepartum; Significant discrepancy between uterine size and clinical dates, antepartum; and Excessive weight gain affecting pregnancy on their problem list.  Patient reports no complaints.  Contractions: Irritability. Vag. Bleeding: None.  Movement: Present. Denies leaking of fluid.   The following portions of the patient's history were reviewed and updated as appropriate: allergies, current medications, past family history, past medical history, past social history, past surgical history and problem list.   Objective:   Vitals:   03/27/19 0915  BP: 117/76  Pulse: 98  Temp: 98.3 F (36.8 C)  Weight: 210 lb (95.3 kg)    Fetal Status: Fetal Heart Rate (bpm): 135 Fundal Height: 39 cm Movement: Present  Presentation: Vertex  General:  Alert, oriented and cooperative. Patient is in no acute distress.  Skin: Skin is warm and dry. No rash noted.   Cardiovascular: Normal heart rate noted  Respiratory: Normal respiratory effort, no problems with respiration noted  Abdomen: Soft, gravid, appropriate for gestational age.  Pain/Pressure: Absent     Pelvic: Cervical exam deferred       patient declined   Extremities: Normal range of motion.  Edema: None  Mental Status: Normal mood and affect. Normal behavior. Normal judgment and thought content.   Assessment and Plan:  Pregnancy: [id number] at [redacted]w[redacted]d 1. Supervision of other normal pregnancy, antepartum - Patient doing well, no complaints - Patient has maternity shoot today  - Routine prenatal care  - Anticipatory guidance on upcoming appointments with next being mychart visit  - Culture, beta strep (group b only) - Cervicovaginal ancillary only( [facility])  2. Significant discrepancy between uterine size and clinical dates, antepartum - S>D  - fetal growth [place] today Est. FW: 3309  gm   7 lb 5 oz  93  % - Patient reports largest baby was 8 lb 11oz, reports all babies birth weights over 8 lbs.   3. History of gestational hypertension - BP stable - Denies HA, vision changes or RUQ pain    Preterm labor symptoms and general obstetric precautions including but not limited to vaginal bleeding, contractions, leaking of fluid and fetal movement were reviewed in detail with the patient. Please refer to After Visit Summary for other counseling recommendations.   Return in about 2 weeks (around 04/10/2019) for ROB-mychart.  Future Appointments  Date Time Provider [place]  04/10/2019  8:45 AM Rasch, Artist Pais, NP [facility]    Lajean Manes, CNM

## 2019-03-27 NOTE — Patient Instructions (Signed)
Reasons to go to MAU:  1.  Contractions are  5 minutes apart or less, each last 1 minute, these have been going on for 1-2 hours, and you cannot walk or talk during them 2.  You have a large gush of fluid, or a trickle of fluid that will not stop and you have to wear a pad 3.  You have bleeding that is bright red, heavier than spotting--like menstrual bleeding (spotting can be normal in early labor or after a check of your cervix) 4.  You do not feel the baby moving like he/she normally does  

## 2019-03-28 LAB — CULTURE, BETA STREP (GROUP B ONLY)
MICRO NUMBER:: 10096335
SPECIMEN QUALITY:: ADEQUATE

## 2019-03-30 LAB — CERVICOVAGINAL ANCILLARY ONLY
Chlamydia: NEGATIVE
Comment: NEGATIVE
Comment: NORMAL
Neisseria Gonorrhea: NEGATIVE

## 2019-04-09 ENCOUNTER — Encounter: Payer: Self-pay | Admitting: *Deleted

## 2019-04-10 ENCOUNTER — Telehealth (INDEPENDENT_AMBULATORY_CARE_PROVIDER_SITE_OTHER): Payer: Medicaid Other | Admitting: Obstetrics and Gynecology

## 2019-04-10 DIAGNOSIS — O26843 Uterine size-date discrepancy, third trimester: Secondary | ICD-10-CM

## 2019-04-10 DIAGNOSIS — O26849 Uterine size-date discrepancy, unspecified trimester: Secondary | ICD-10-CM

## 2019-04-10 DIAGNOSIS — Z3A37 37 weeks gestation of pregnancy: Secondary | ICD-10-CM

## 2019-04-10 DIAGNOSIS — Z3480 Encounter for supervision of other normal pregnancy, unspecified trimester: Secondary | ICD-10-CM

## 2019-04-10 NOTE — Patient Instructions (Signed)
Cervical Ripening: May try one or both  Red Raspberry Leaf capsules:  two 300mg  or 400mg  tablets with each meal, 2-3 times a day  Potential Side Effects Of Raspberry Leaf:  Most women do not experience any side effects from drinking raspberry leaf tea. However, nausea and loose stools are possible     Evening Primrose Oil capsules: may take 1 to 3 capsules daily. May also prick one to release the oil and insert it into your vagina at night.  Some of the potential side effects:  Upset stomach  Loose stools or diarrhea  Headaches  Nausea:

## 2019-04-10 NOTE — Progress Notes (Signed)
[place] VIRTUAL VIDEO VISIT ENCOUNTER NOTE  Provider location: [facility] at [place]   I connected with Megan Powell on 04/10/19 at  8:45 AM EST by WebEx  Encounter at home and verified that I am speaking with the correct person using two identifiers.   I discussed the limitations, risks, security and privacy concerns of performing an evaluation and management service virtually and the availability of in person appointments. I also discussed with the patient that there may be a patient responsible charge related to this service. The patient expressed understanding and agreed to proceed. Subjective:  Megan Powell is a 29 y.o. [phone] at [redacted]w[redacted]d being seen today for ongoing prenatal care.  She is currently monitored for the following issues for this low-risk pregnancy and has History of gestational hypertension; Supervision of other normal pregnancy, antepartum; Significant discrepancy between uterine size and clinical dates, antepartum; and Excessive weight gain affecting pregnancy on their problem list.  Patient reports no complaints.  Contractions: Irritability. Vag. Bleeding: None.  Movement: Present. Denies any leaking of fluid.   The following portions of the patient's history were reviewed and updated as appropriate: allergies, current medications, past family history, past medical history, past social history, past surgical history and problem list.   Objective:   Vitals:   04/10/19 0827  BP: 120/70  Pulse: 99  Weight: 213 lb (96.6 kg)    Fetal Status:     Movement: Present     General:  Alert, oriented and cooperative. Patient is in no acute distress.  Respiratory: Normal respiratory effort, no problems with respiration noted  Mental Status: Normal mood and affect. Normal behavior. Normal judgment and thought content.  Rest of physical exam deferred due to type of encounter  Imaging: [place] MFM OB FOLLOW UP  Result Date:  03/27/2019 ----------------------------------------------------------------------  OBSTETRICS REPORT                       (Signed Final 03/27/2019 09:23 am) ---------------------------------------------------------------------- Patient Info  ID #:       [id number]                          D.O.B.:  1990/12/19 (28 yrs)  Name:       Megan Powell                   Visit Date: 03/27/2019 08:07 am ---------------------------------------------------------------------- Performed By  Performed By:     Rodrigo Ran BS      Ref. Address:     Faculty                    RDMS RVT  Attending:        Tama High MD        [facility]  Referred By:      Lajean Manes CNM ---------------------------------------------------------------------- Orders   #  Description  Code         Ordered By   1  [place] MFM OB FOLLOW UP                  [id number]     Tama High  ----------------------------------------------------------------------   #  Order #                    Accession #                 Episode #   1  [id number]                  [id number]                  [id number]  ---------------------------------------------------------------------- Indications   Encounter for other antenatal screening        Z36.2   follow-up   Poor obstetric history: Previous               O09.299   preeclampsia / eclampsia/gestational HTN   Obesity complicating pregnancy, third          O99.213   trimester   Uterine size-date discrepancy, third trimester O26.843   [redacted] weeks gestation of pregnancy                Z3A.35  ---------------------------------------------------------------------- Vital Signs                                                 Height:        5'4" ---------------------------------------------------------------------- Fetal Evaluation  Num Of Fetuses:         1  Fetal Heart Rate(bpm):  154  Cardiac  Activity:       Observed  Presentation:           Cephalic  Placenta:               Posterior  P. Cord Insertion:      Previously Visualized  Amniotic Fluid  AFI FV:      Within normal limits  AFI Sum(cm)     %Tile       Largest Pocket(cm)  16.13           59          5.81  RUQ(cm)       RLQ(cm)       LUQ(cm)        LLQ(cm)  2.35          4.11          3.86           5.81 ---------------------------------------------------------------------- Biometry  BPD:      94.2  mm     G. Age:  38w 3d         98  %    CI:        76.91   %    70 - 86                                                          FL/HC:      20.0   %    20.1 -  22.1  HC:      340.2  mm     G. Age:  39w 1d         90  %    HC/AC:      0.98        0.93 - 1.11  AC:      346.7  mm     G. Age:  38w 4d         99  %    FL/BPD:     72.2   %    71 - 87  FL:         68  mm     G. Age:  35w 0d         22  %    FL/AC:      19.6   %    20 - 24  HUM:        59  mm     G. Age:  34w 1d         34  %  Est. FW:    3309  gm      7 lb 5 oz     93  % ---------------------------------------------------------------------- OB History  Gravidity:    4         Term:   3 ---------------------------------------------------------------------- Gestational Age  LMP:           35w 6d        Date:  07/19/18                 EDD:   04/25/19  U/S Today:     37w 6d                                        EDD:   04/11/19  Best:          35w 6d     Det. By:  LMP  (07/19/18)          EDD:   04/25/19 ---------------------------------------------------------------------- Anatomy  Cranium:               Appears normal         LVOT:                   Previously seen  Cavum:                 Previously seen        Aortic Arch:            Previously seen  Ventricles:            Appears normal         Ductal Arch:            Previously seen  Choroid Plexus:        Appears normal         Diaphragm:              Previously seen  Cerebellum:            Previously seen        Stomach:                 Appears normal, left  sided  Posterior Fossa:       Previously seen        Abdomen:                Previously seen  Nuchal Fold:           Previously seen        Abdominal Wall:         Previously seen  Face:                  Orbits and profile     Cord Vessels:           Previously seen                         previously seen  Lips:                  Previously seen        Kidneys:                Appear normal  Palate:                Previously seen        Bladder:                Appears normal  Thoracic:              Appears normal         Spine:                  Previously seen  Heart:                 Previously seen        Upper Extremities:      Previously seen  RVOT:                  Previously seen        Lower Extremities:      Previously seen  Other:  Parents do not wish to know sex of fetus. Heels and 5th digit          visualized previously. Nasal bone visualized previously. ---------------------------------------------------------------------- Cervix Uterus Adnexa  Cervix  Not visualized (advanced GA >24wks)  Uterus  No abnormality visualized.  Left Ovary  Within normal limits.  Right Ovary  Within normal limits.  Cul De Sac  No free fluid seen.  Adnexa  No abnormality visualized. ---------------------------------------------------------------------- Impression  Patient returned for fetal growth assessment. Amniotic fluid is  normal and good fetal activity is seen. The estimated fetal  weight is at the 93rd percentile.  She does not have gestational diabetes.  Ultrasound has limitations in accurately-estimating fetal  weights. Large fetus is more-likely to be constitutional. ---------------------------------------------------------------------- Recommendations  Follow-up scans as clinically indicated. ----------------------------------------------------------------------                  Tama High, MD Electronically Signed Final  Report   03/27/2019 09:23 am ----------------------------------------------------------------------   Assessment and Plan:  Pregnancy: [id number] at [redacted]w[redacted]d 1. Significant discrepancy between uterine size and clinical dates, antepartum   2. Supervision of other normal pregnancy, antepartum  + GBS visit. In-person visit next week for membranes strip  Start primose oil  BP looks good today.   Term labor symptoms and general obstetric precautions including but not limited to vaginal bleeding, contractions, leaking of fluid and fetal movement were reviewed in detail with the patient. I discussed the assessment and  treatment plan with the patient. The patient was provided an opportunity to ask questions and all were answered. The patient agreed with the plan and demonstrated an understanding of the instructions. The patient was advised to call back or seek an in-person office evaluation/go to [facility] for any urgent or concerning symptoms. Please refer to After Visit Summary for other counseling recommendations.   I provided 10 minutes of face-to-face time during this encounter.  Return in about 1 week (around 04/17/2019) for For in-person visit. Marland Kitchen  No future appointments.  Noni Saupe, NP [facility], [place]

## 2019-04-17 ENCOUNTER — Ambulatory Visit (INDEPENDENT_AMBULATORY_CARE_PROVIDER_SITE_OTHER): Payer: Medicaid Other | Admitting: Certified Nurse Midwife

## 2019-04-17 ENCOUNTER — Other Ambulatory Visit: Payer: Self-pay

## 2019-04-17 VITALS — BP 131/84 | HR 91 | Temp 98.3°F | Wt 214.0 lb

## 2019-04-17 DIAGNOSIS — Z3480 Encounter for supervision of other normal pregnancy, unspecified trimester: Secondary | ICD-10-CM

## 2019-04-17 DIAGNOSIS — Z3A38 38 weeks gestation of pregnancy: Secondary | ICD-10-CM

## 2019-04-17 NOTE — Progress Notes (Signed)
Want Membranes swept

## 2019-04-17 NOTE — Progress Notes (Signed)
   Subjective:  Megan Powell is a 29 y.o. [phone] at [redacted]w[redacted]d being seen today for ongoing prenatal care.  She is currently monitored for the following issues for this low-risk pregnancy and has History of gestational hypertension; Supervision of other normal pregnancy, antepartum; Significant discrepancy between uterine size and clinical dates, antepartum; and Excessive weight gain affecting pregnancy on their problem list.  Patient reports no complaints and requesting membrane sweep.  Contractions: Irritability. Vag. Bleeding: None.  Movement: Present. Denies leaking of fluid.   The following portions of the patient's history were reviewed and updated as appropriate: allergies, current medications, past family history, past medical history, past social history, past surgical history and problem list. Problem list updated.  Objective:   Vitals:   04/17/19 1057  BP: 131/84  Pulse: 91  Temp: 98.3 F (36.8 C)  Weight: 214 lb (97.1 kg)    Fetal Status: Fetal Heart Rate (bpm): 136   Movement: Present  Presentation: Vertex  General:  Alert, oriented and cooperative. Patient is in no acute distress.  Skin: Skin is warm and dry.  Cardiovascular: Normal heart rate noted  Respiratory: Normal respiratory effort and rate  Abdomen: Soft, appropriate for gestational age. Pain/Pressure: Present     Pelvic: Vag. Bleeding: None Vag D/C Character: Thin   Cervical exam performed Dilation: 1.5 Effacement (%): 50 Station: -2  Extremities: Normal range of motion.  Edema: None  Mental Status: Normal mood and affect. Normal behavior. Normal judgment and thought content.   Urinalysis:      Assessment and Plan:  Pregnancy: G4P3003 at [redacted]w[redacted]d  1. Supervision of other normal pregnancy, antepartum - requested membrane sweep, discussed risks/benefits, done and tolerated well - AT next week if not delivered  2. Suspected LGA - all babies 8+ lbs, pelvis proven to 8'12, no hx SD or OVD  Term labor symptoms  and general obstetric precautions including but not limited to vaginal bleeding, contractions, leaking of fluid and fetal movement were reviewed in detail with the patient. Please refer to After Visit Summary for other counseling recommendations.  Return in about 1 week (around 04/24/2019).   Julianne Handler, CNM

## 2019-04-24 ENCOUNTER — Encounter (HOSPITAL_COMMUNITY): Payer: Self-pay | Admitting: Obstetrics & Gynecology

## 2019-04-24 ENCOUNTER — Inpatient Hospital Stay (HOSPITAL_COMMUNITY): Payer: Medicaid Other | Admitting: Anesthesiology

## 2019-04-24 ENCOUNTER — Other Ambulatory Visit: Payer: Self-pay

## 2019-04-24 ENCOUNTER — Ambulatory Visit (INDEPENDENT_AMBULATORY_CARE_PROVIDER_SITE_OTHER): Payer: Medicaid Other | Admitting: Certified Nurse Midwife

## 2019-04-24 ENCOUNTER — Inpatient Hospital Stay (HOSPITAL_COMMUNITY)
Admission: AD | Admit: 2019-04-24 | Discharge: 2019-04-26 | DRG: 798 | Disposition: A | Discharge status: Home / Self Care | Payer: Medicaid Other | Attending: Obstetrics & Gynecology | Admitting: Obstetrics & Gynecology

## 2019-04-24 VITALS — BP 130/82 | HR 82 | Wt 215.0 lb

## 2019-04-24 DIAGNOSIS — O26899 Other specified pregnancy related conditions, unspecified trimester: Secondary | ICD-10-CM | POA: Insufficient documentation

## 2019-04-24 DIAGNOSIS — Z3A39 39 weeks gestation of pregnancy: Secondary | ICD-10-CM

## 2019-04-24 DIAGNOSIS — Z20822 Contact with and (suspected) exposure to covid-19: Secondary | ICD-10-CM | POA: Diagnosis present

## 2019-04-24 DIAGNOSIS — Z302 Encounter for sterilization: Secondary | ICD-10-CM

## 2019-04-24 DIAGNOSIS — O99824 Streptococcus B carrier state complicating childbirth: Secondary | ICD-10-CM | POA: Diagnosis not present

## 2019-04-24 DIAGNOSIS — Z3480 Encounter for supervision of other normal pregnancy, unspecified trimester: Secondary | ICD-10-CM

## 2019-04-24 DIAGNOSIS — O164 Unspecified maternal hypertension, complicating childbirth: Secondary | ICD-10-CM | POA: Diagnosis not present

## 2019-04-24 DIAGNOSIS — O360130 Maternal care for anti-D [Rh] antibodies, third trimester, not applicable or unspecified: Secondary | ICD-10-CM | POA: Diagnosis not present

## 2019-04-24 DIAGNOSIS — Z3483 Encounter for supervision of other normal pregnancy, third trimester: Secondary | ICD-10-CM

## 2019-04-24 DIAGNOSIS — O26843 Uterine size-date discrepancy, third trimester: Secondary | ICD-10-CM | POA: Diagnosis not present

## 2019-04-24 DIAGNOSIS — Z6791 Unspecified blood type, Rh negative: Secondary | ICD-10-CM

## 2019-04-24 DIAGNOSIS — O2603 Excessive weight gain in pregnancy, third trimester: Secondary | ICD-10-CM | POA: Diagnosis present

## 2019-04-24 DIAGNOSIS — O26893 Other specified pregnancy related conditions, third trimester: Secondary | ICD-10-CM | POA: Diagnosis present

## 2019-04-24 DIAGNOSIS — O26849 Uterine size-date discrepancy, unspecified trimester: Secondary | ICD-10-CM

## 2019-04-24 DIAGNOSIS — O134 Gestational [pregnancy-induced] hypertension without significant proteinuria, complicating childbirth: Secondary | ICD-10-CM | POA: Diagnosis not present

## 2019-04-24 HISTORY — DX: Other specified pregnancy related conditions, unspecified trimester: O26.899

## 2019-04-24 HISTORY — DX: Unspecified blood type, rh negative: Z67.91

## 2019-04-24 LAB — ABO/RH: ABO/RH(D): A NEG

## 2019-04-24 LAB — CBC
HCT: 41.7 % (ref 36.0–46.0)
Hemoglobin: 13.9 g/dL (ref 12.0–15.0)
MCH: 29.3 pg (ref 26.0–34.0)
MCHC: 33.3 g/dL (ref 30.0–36.0)
MCV: 88 fL (ref 80.0–100.0)
Platelets: 191 10*3/uL (ref 150–400)
RBC: 4.74 MIL/uL (ref 3.87–5.11)
RDW: 15.9 % — ABNORMAL HIGH (ref 11.5–15.5)
WBC: 15.1 10*3/uL — ABNORMAL HIGH (ref 4.0–10.5)
nRBC: 0 % (ref 0.0–0.2)

## 2019-04-24 LAB — COMPREHENSIVE METABOLIC PANEL
ALT: 14 U/L (ref 0–44)
AST: 25 U/L (ref 15–41)
Albumin: 3 g/dL — ABNORMAL LOW (ref 3.5–5.0)
Alkaline Phosphatase: 117 U/L (ref 38–126)
Anion gap: 10 (ref 5–15)
BUN: 10 mg/dL (ref 6–20)
CO2: 19 mmol/L — ABNORMAL LOW (ref 22–32)
Calcium: 9.5 mg/dL (ref 8.9–10.3)
Chloride: 106 mmol/L (ref 98–111)
Creatinine, Ser: 0.53 mg/dL (ref 0.44–1.00)
GFR calc Af Amer: >60 mL/min (ref >60)
GFR calc non Af Amer: >60 mL/min (ref >60)
Glucose, Bld: 86 mg/dL (ref 70–99)
Potassium: 4.2 mmol/L (ref 3.5–5.1)
Sodium: 135 mmol/L (ref 135–145)
Total Bilirubin: 0.6 mg/dL (ref 0.3–1.2)
Total Protein: 6.7 g/dL (ref 6.5–8.1)

## 2019-04-24 LAB — RESPIRATORY PANEL BY RT PCR (FLU A&B, COVID)
Influenza A by PCR: NEGATIVE
Influenza B by PCR: NEGATIVE
SARS Coronavirus 2 by RT PCR: NEGATIVE

## 2019-04-24 LAB — TYPE AND SCREEN
ABO/RH(D): A NEG
Antibody Screen: NEGATIVE

## 2019-04-24 MED ORDER — FENTANYL-BUPIVACAINE-NACL 0.5-0.125-0.9 MG/250ML-% EP SOLN
EPIDURAL | Status: AC
  Filled 2019-04-24: qty 250

## 2019-04-24 MED ORDER — LACTATED RINGERS IV SOLN: 500.0000 mL | INTRAVENOUS | Status: DC | PRN

## 2019-04-24 MED ORDER — SENNOSIDES-DOCUSATE SODIUM 8.6-50 MG PO TABS
2.0000 | ORAL_TABLET | ORAL | Status: DC
  Administered 2019-04-24 – 2019-04-25 (×2): 2 via ORAL
  Filled 2019-04-24 (×2): qty 2

## 2019-04-24 MED ORDER — ACETAMINOPHEN 325 MG PO TABS: 650.0000 mg | ORAL_TABLET | ORAL | Status: DC | PRN

## 2019-04-24 MED ORDER — SIMETHICONE 80 MG PO CHEW: 80.0000 mg | CHEWABLE_TABLET | ORAL | Status: DC | PRN

## 2019-04-24 MED ORDER — MEASLES, MUMPS & RUBELLA VAC IJ SOLR: 0.5000 mL | Freq: Once | INTRAMUSCULAR | Status: DC

## 2019-04-24 MED ORDER — ONDANSETRON HCL 4 MG PO TABS: 4.0000 mg | ORAL_TABLET | ORAL | Status: DC | PRN

## 2019-04-24 MED ORDER — ONDANSETRON HCL 4 MG/2ML IJ SOLN: 4.0000 mg | INTRAMUSCULAR | Status: DC | PRN

## 2019-04-24 MED ORDER — LACTATED RINGERS IV SOLN: INTRAVENOUS | Status: DC

## 2019-04-24 MED ORDER — SODIUM CHLORIDE (PF) 0.9 % IJ SOLN
INTRAMUSCULAR | Status: DC | PRN
  Administered 2019-04-24: 12 mL/h via EPIDURAL

## 2019-04-24 MED ORDER — OXYCODONE-ACETAMINOPHEN 5-325 MG PO TABS: 1.0000 | ORAL_TABLET | ORAL | Status: DC | PRN

## 2019-04-24 MED ORDER — DIPHENHYDRAMINE HCL 50 MG/ML IJ SOLN: 12.5000 mg | INTRAMUSCULAR | Status: DC | PRN

## 2019-04-24 MED ORDER — LIDOCAINE HCL (PF) 1 % IJ SOLN
INTRAMUSCULAR | Status: DC | PRN
  Administered 2019-04-24 (×2): 5 mL via EPIDURAL

## 2019-04-24 MED ORDER — WITCH HAZEL-GLYCERIN EX PADS: 1.0000 "application " | MEDICATED_PAD | CUTANEOUS | Status: DC | PRN

## 2019-04-24 MED ORDER — FENTANYL-BUPIVACAINE-NACL 0.5-0.125-0.9 MG/250ML-% EP SOLN
12.0000 mL/h | EPIDURAL | Status: DC | PRN
  Filled 2019-04-24: qty 250

## 2019-04-24 MED ORDER — ONDANSETRON HCL 4 MG/2ML IJ SOLN: 4.0000 mg | Freq: Four times a day (QID) | INTRAMUSCULAR | Status: DC | PRN

## 2019-04-24 MED ORDER — PHENYLEPHRINE 40 MCG/ML (10ML) SYRINGE FOR IV PUSH (FOR BLOOD PRESSURE SUPPORT): 80.0000 ug | PREFILLED_SYRINGE | INTRAVENOUS | Status: DC | PRN

## 2019-04-24 MED ORDER — DIBUCAINE (PERIANAL) 1 % EX OINT: 1.0000 "application " | TOPICAL_OINTMENT | CUTANEOUS | Status: DC | PRN

## 2019-04-24 MED ORDER — EPHEDRINE 5 MG/ML INJ: 10.0000 mg | INTRAVENOUS | Status: DC | PRN

## 2019-04-24 MED ORDER — IBUPROFEN 600 MG PO TABS
600.0000 mg | ORAL_TABLET | Freq: Four times a day (QID) | ORAL | Status: DC
  Administered 2019-04-24 – 2019-04-26 (×8): 600 mg via ORAL
  Filled 2019-04-24 (×8): qty 1

## 2019-04-24 MED ORDER — PENICILLIN G POT IN DEXTROSE 60000 UNIT/ML IV SOLN: 3.0000 10*6.[IU] | INTRAVENOUS | Status: DC

## 2019-04-24 MED ORDER — BENZOCAINE-MENTHOL 20-0.5 % EX AERO: 1.0000 "application " | INHALATION_SPRAY | CUTANEOUS | Status: DC | PRN

## 2019-04-24 MED ORDER — LACTATED RINGERS IV SOLN: 500.0000 mL | Freq: Once | INTRAVENOUS | Status: DC

## 2019-04-24 MED ORDER — COCONUT OIL OIL: 1.0000 "application " | TOPICAL_OIL | Status: DC | PRN

## 2019-04-24 MED ORDER — OXYTOCIN BOLUS FROM INFUSION
500.0000 mL | Freq: Once | INTRAVENOUS | Status: AC
  Administered 2019-04-24: 14:00:00 500 mL via INTRAVENOUS

## 2019-04-24 MED ORDER — OXYCODONE-ACETAMINOPHEN 5-325 MG PO TABS: 2.0000 | ORAL_TABLET | ORAL | Status: DC | PRN

## 2019-04-24 MED ORDER — TETANUS-DIPHTH-ACELL PERTUSSIS 5-2.5-18.5 LF-MCG/0.5 IM SUSP: 0.5000 mL | Freq: Once | INTRAMUSCULAR | Status: DC

## 2019-04-24 MED ORDER — PRENATAL MULTIVITAMIN CH
1.0000 | ORAL_TABLET | Freq: Every day | ORAL | Status: DC
  Administered 2019-04-25 – 2019-04-26 (×2): 1 via ORAL
  Filled 2019-04-24 (×2): qty 1

## 2019-04-24 MED ORDER — DIPHENHYDRAMINE HCL 25 MG PO CAPS: 25.0000 mg | ORAL_CAPSULE | Freq: Four times a day (QID) | ORAL | Status: DC | PRN

## 2019-04-24 MED ORDER — SOD CITRATE-CITRIC ACID 500-334 MG/5ML PO SOLN: 30.0000 mL | ORAL | Status: DC | PRN

## 2019-04-24 MED ORDER — OXYTOCIN 40 UNITS IN NORMAL SALINE INFUSION - SIMPLE MED
2.5000 [IU]/h | INTRAVENOUS | Status: DC
  Administered 2019-04-24: 2.5 [IU]/h via INTRAVENOUS
  Filled 2019-04-24: qty 1000

## 2019-04-24 MED ORDER — LIDOCAINE HCL (PF) 1 % IJ SOLN
30.0000 mL | INTRAMUSCULAR | Status: DC | PRN
  Filled 2019-04-24: qty 30

## 2019-04-24 MED ORDER — SODIUM CHLORIDE 0.9 % IV SOLN
5.0000 10*6.[IU] | Freq: Once | INTRAVENOUS | Status: AC
  Administered 2019-04-24: 5 10*6.[IU] via INTRAVENOUS
  Filled 2019-04-24: qty 5

## 2019-04-24 MED ORDER — PHENYLEPHRINE 40 MCG/ML (10ML) SYRINGE FOR IV PUSH (FOR BLOOD PRESSURE SUPPORT)
80.0000 ug | PREFILLED_SYRINGE | INTRAVENOUS | Status: DC | PRN
  Filled 2019-04-24: qty 10

## 2019-04-24 NOTE — Lactation Note (Signed)
This note was copied from a baby's chart. Lactation Consultation Note  Patient Name: Megan Powell [id number] Date: 04/24/2019 Reason for consult: Initial assessment;Term  P4 mother whose infant is now 61 hours old.  This is a term baby.  Mother breast fed her first child (now 27 years old) for 2 1/2 years, her second child (now 35 years old) for over a year and her third child (now 12 years old) for over a year.    Mother is planning on having a tubal tomorrow and has already expressed colostrum and placed it in the refrigerator for father to feed during her procedure.  Baby was asleep in the bassinette when I arrived. Mother is familiar with feeding cues and hand expression.  She required no review; very knowledgeable and has her plan in place.  She will feed 8-12 times/24 hours or sooner if baby shows feeding cues.  She will hand express and feed back any EBM she obtains to baby.  Colostrum container provided and milk storage times discussed.  Mom made aware of O/P services, breastfeeding support groups, community resources, and our phone # for post-discharge questions.  Mother has a DEBP for home use.  She will call for latch assistance as needed.  Father will be staying with mother tonight; getting dinner for them at this time.   Maternal Data Formula Feeding for Exclusion: No Has patient been taught Hand Expression?: Yes Does the patient have breastfeeding experience prior to this delivery?: Yes  Feeding Feeding Type: Breast Fed  LATCH Score Latch: Grasps breast easily, tongue down, lips flanged, rhythmical sucking.  Audible Swallowing: Spontaneous and intermittent  Type of Nipple: Everted at rest and after stimulation  Comfort (Breast/Nipple): Soft / non-tender  Hold (Positioning): No assistance needed to correctly position infant at breast.  LATCH Score: 10  Interventions Interventions: Skin to skin  Lactation Tools Discussed/Used     Consult Status Consult Status:  Follow-up Date: 04/25/19 Follow-up type: In-patient    Megan Powell 04/24/2019, 6:14 PM

## 2019-04-24 NOTE — Progress Notes (Signed)
Subjective:  Megan Powell is a 29 y.o. [phone] at [redacted]w[redacted]d being seen today for ongoing prenatal care.  She is currently monitored for the following issues for this low-risk pregnancy and has History of gestational hypertension; Supervision of other normal pregnancy, antepartum; Significant discrepancy between uterine size and clinical dates, antepartum; Excessive weight gain affecting pregnancy; and Indication for care in labor or delivery on their problem list.  Patient reports contractions since 8am, q10 min, uncomfortable.  Contractions: Regular. Vag. Bleeding: None.  Movement: Present. Denies leaking of fluid.   The following portions of the patient's history were reviewed and updated as appropriate: allergies, current medications, past family history, past medical history, past social history, past surgical history and problem list. Problem list updated.  Objective:   Vitals:   04/24/19 1002 04/24/19 1028  BP: 130/82 130/82  Pulse: 82 82  Weight: 215 lb (97.5 kg)     Fetal Status: Fetal Heart Rate (bpm): NST-R   Movement: Present  Presentation: Vertex  General:  Alert, oriented and cooperative. Patient is in no acute distress.  Skin: Skin is warm and dry. No rash noted.   Cardiovascular: Normal heart rate noted  Respiratory: Normal respiratory effort, no problems with respiration noted  Abdomen: Soft, gravid, appropriate for gestational age. Pain/Pressure: Present     Pelvic: Vag. Bleeding: None Vag D/C Character: Thin   Cervical exam performed Dilation: 5 Effacement (%): 80 Station: -2  Extremities: Normal range of motion.  Edema: None  Mental Status: Normal mood and affect. Normal behavior. Normal judgment and thought content.   Urinalysis:      Assessment and Plan:  Pregnancy: G4P3003 at [redacted]w[redacted]d  1. Supervision of other normal pregnancy, antepartum - NST reactive, AFI 11cm - recommend admission for labor and GBS prophylaxis - proceed to [facility] - LD and labor team notified  2.  Significant discrepancy between uterine size and clinical dates, antepartum - suspected LGA - pelvis proven to 8'12  Term labor symptoms and general obstetric precautions including but not limited to vaginal bleeding, contractions, leaking of fluid and fetal movement were reviewed in detail with the patient. Please refer to After Visit Summary for other counseling recommendations.  No follow-ups on file.   Julianne Handler, CNM

## 2019-04-24 NOTE — Discharge Summary (Signed)
Postpartum Discharge Summary      Patient Name: Megan Powell DOB: Mar 30, 1990 MRN: [id number]  Date of admission: 04/24/2019 Delivering Provider: Julianne Handler   Date of discharge: 04/26/2019  Admitting diagnosis: Indication for care in labor or delivery [O75.9] Intrauterine pregnancy: [redacted]w[redacted]d    Secondary diagnosis:  Active Problems:   Indication for care in labor or delivery   SVD (spontaneous vaginal delivery)  Additional problems: Rh negative, excessive weight gain, hx gHTN in previous pregnancy     Discharge diagnosis: Term Pregnancy Delivered                                                                                                Post partum procedures:BTL with Filshie clips  Augmentation: none  Complications: None  Hospital course:  Onset of Labor With Vaginal Delivery     29y.o. yo [id number] 381w6das admitted in Active Labor on 04/24/2019. She was found to be mildly hypertensive in labor. PEC labs were ordered. Patient had an uncomplicated labor course as follows:  Membrane Rupture Time/Date: 1:58 PM ,04/24/2019   Intrapartum Procedures: Episiotomy: None [1]                                         Lacerations:  None [1]  Patient had a delivery of a Viable infant. 04/24/2019  Information for the patient's newborn:  [name], Megan Powell[[id number]]Delivery Method: Vag-Spont     Pateint had an uncomplicated postpartum course. BTL done with Filshie clips. She is ambulating, tolerating a regular diet, passing flatus, and urinating well. Patient is discharged home in stable condition on 04/26/19.  Delivery time: 2:07 PM    Magnesium Sulfate received: No BMZ received: No Rhophylac:Yes MMR:N/A Transfusion:No  Physical exam  Vitals:   04/25/19 1724 04/25/19 2143 04/25/19 2356 04/26/19 0500  BP: 116/78 127/78 115/66 113/74  Pulse: 87 87 73 73  Resp: '16 18 18 18  '$ Temp: 98.1 F (36.7 C) 97.9 F (36.6 C) 97.7 F (36.5 C) 98.1 F (36.7 C)  TempSrc: Oral  Oral Axillary Oral  SpO2: 99% 98% 100% 100%  Weight:      Height:       General: alert, cooperative and no distress Lochia: appropriate Uterine Fundus: firm Incision: Healing well with no significant drainage DVT Evaluation: No evidence of DVT seen on physical exam. Labs: Lab Results  Component Value Date   WBC 15.1 (H) 04/24/2019   HGB 13.9 04/24/2019   HCT 41.7 04/24/2019   MCV 88.0 04/24/2019   PLT 191 04/24/2019   CMP Latest Ref Rng & Units 04/24/2019  Glucose 70 - 99 mg/dL 86  BUN 6 - 20 mg/dL 10  Creatinine 0.44 - 1.00 mg/dL 0.53  Sodium 135 - 145 mmol/L 135  Potassium 3.5 - 5.1 mmol/L 4.2  Chloride 98 - 111 mmol/L 106  CO2 22 - 32 mmol/L 19(L)  Calcium 8.9 - 10.3 mg/dL 9.5  Total Protein 6.5 - 8.1 g/dL 6.7  Total Bilirubin  0.3 - 1.2 mg/dL 0.6  Alkaline Phos 38 - 126 U/L 117  AST 15 - 41 U/L 25  ALT 0 - 44 U/L 14   Edinburgh Score: Edinburgh Postnatal Depression Scale Screening Tool 04/25/2019  I have been able to laugh and see the funny side of things. 0  I have looked forward with enjoyment to things. 0  I have blamed myself unnecessarily when things went wrong. 0  I have been anxious or worried for no good reason. 0  I have felt scared or panicky for no good reason. 0  Things have been getting on top of me. 0  I have been so unhappy that I have had difficulty sleeping. 0  I have felt sad or miserable. 0  I have been so unhappy that I have been crying. 0  The thought of harming myself has occurred to me. 0  Edinburgh Postnatal Depression Scale Total 0    Discharge instruction: per After Visit Summary and "Baby and Me Booklet".  After visit meds:  Allergies as of 04/26/2019   No Known Allergies     Medication List    STOP taking these medications   aspirin 81 MG chewable tablet   ferrous sulfate 325 (65 FE) MG tablet     TAKE these medications   acetaminophen 325 MG tablet Commonly known as: Tylenol Take 2 tablets (650 mg total) by mouth every 6  (six) hours as needed (for pain scale < 4).   docusate sodium 100 MG capsule Commonly known as: COLACE Take 100 mg by mouth daily as needed for mild constipation.   ibuprofen 600 MG tablet Commonly known as: ADVIL Take 1 tablet (600 mg total) by mouth every 6 (six) hours.   omeprazole 20 MG capsule Commonly known as: PriLOSEC Take 1 capsule (20 mg total) by mouth daily.   oxyCODONE 5 MG immediate release tablet Commonly known as: Roxicodone Take 1 tablet (5 mg total) by mouth every 4 (four) hours as needed for severe pain.   Probiotic Caps Take 2 capsules by mouth at bedtime.   senna-docusate 8.6-50 MG tablet Commonly known as: Senokot-S Take 2 tablets by mouth daily. Start taking on: April 27, 2019   Vitafol Ultra 29-0.6-0.4-200 MG Caps Take 1 capsule by mouth daily.       Diet: routine diet  Activity: Advance as tolerated. Pelvic rest for 6 weeks.   Outpatient follow up:4 weeks Follow up Appt:No future appointments. Follow up Visit:  Please schedule this patient for Postpartum visit in: 6 weeks with the following provider: Any provider In-Person For C/S patients schedule nurse incision check in weeks 2 weeks: no Low risk pregnancy complicated by: HTN Delivery mode:  SVD Anticipated Birth Control:  BTL done PP PP Procedures needed: BP check  Schedule Integrated BH visit: no  Newborn Data: Live born female  Birth Weight: 4090g APGAR: 9, 9   Newborn Delivery   Birth date/time: 04/24/2019 14:07:00 Delivery type:       Baby Feeding: Breast Disposition:home with mother   04/26/2019 [name], MD

## 2019-04-24 NOTE — Anesthesia Preprocedure Evaluation (Signed)
Anesthesia Evaluation  Patient identified by MRN, date of birth, ID band Patient awake    Reviewed: Allergy & Precautions, H&P , NPO status , Patient's Chart, lab work & pertinent test results  Airway Mallampati: II   Neck ROM: full    Dental   Pulmonary neg pulmonary ROS,    breath sounds clear to auscultation       Cardiovascular hypertension,  Rhythm:regular Rate:Normal     Neuro/Psych    GI/Hepatic   Endo/Other  Morbid obesity  Renal/GU      Musculoskeletal   Abdominal   Peds  Hematology   Anesthesia Other Findings   Reproductive/Obstetrics (+) Pregnancy                             Anesthesia Physical Anesthesia Plan  ASA: II  Anesthesia Plan: Epidural   Post-op Pain Management:    Induction: Intravenous  PONV Risk Score and Plan: 2 and Treatment may vary due to age or medical condition  Airway Management Planned: Natural Airway  Additional Equipment:   Intra-op Plan:   Post-operative Plan:   Informed Consent: I have reviewed the patients History and Physical, chart, labs and discussed the procedure including the risks, benefits and alternatives for the proposed anesthesia with the patient or authorized representative who has indicated his/her understanding and acceptance.       Plan Discussed with: Anesthesiologist  Anesthesia Plan Comments:         Anesthesia Quick Evaluation

## 2019-04-24 NOTE — H&P (Signed)
OBSTETRIC ADMISSION HISTORY AND PHYSICAL  Megan Powell is a 29 y.o. female [phone] with IUP at [redacted]w[redacted]d presenting for labor. Ctx started qt 8am q10 min, now q2-3 min. She reports +FMs. No LOF, VB, blurry vision, headaches, peripheral edema, or RUQ pain. She plans on breastfeeding. She requests BTL for birth control.  Dating: By LMP and 7 wk [place] --->  Estimated Date of Delivery: 04/25/19  Sono:    @[redacted]w[redacted]d , normal anatomy, vtx presentation, 3309g, 93%ile, EFW 7'5   Prenatal History/Complications: - hx gHTN in previous pregnancy - excessive weight gain - Rh negative- had Rhogam  Past Medical History: Past Medical History:  Diagnosis Date  . Hypertension affecting pregnancy   . Pregnancy induced hypertension     Past Surgical History: Past Surgical History:  Procedure Laterality Date  . NO PAST SURGERIES      Obstetrical History: OB History    Gravida  4   Para  3   Term  3   Preterm      AB      Living  3     SAB      TAB      Ectopic      Multiple  0   Live Births  3           Social History: Social History   Socioeconomic History  . Marital status: Married    Spouse name: Not on file  . Number of children: Not on file  . Years of education: Not on file  . Highest education level: Not on file  Occupational History  . Occupation: homemaker  Tobacco Use  . Smoking status: Never Smoker  . Smokeless tobacco: Never Used  Substance and Sexual Activity  . Alcohol use: No    Alcohol/week: 0.0 standard drinks  . Drug use: No  . Sexual activity: Yes    Partners: Male  Other Topics Concern  . Not on file  Social History Narrative  . Not on file   Social Determinants of Health   Financial Resource Strain:   . Difficulty of Paying Living Expenses: Not on file  Food Insecurity:   . Worried About Charity fundraiser in the Last Year: Not on file  . Ran Out of Food in the Last Year: Not on file  Transportation Needs:   . Lack of Transportation  (Medical): Not on file  . Lack of Transportation (Non-Medical): Not on file  Physical Activity:   . Days of Exercise per Week: Not on file  . Minutes of Exercise per Session: Not on file  Stress:   . Feeling of Stress : Not on file  Social Connections:   . Frequency of Communication with Friends and Family: Not on file  . Frequency of Social Gatherings with Friends and Family: Not on file  . Attends Religious Services: Not on file  . Active Member of Clubs or Organizations: Not on file  . Attends Archivist Meetings: Not on file  . Marital Status: Not on file    Family History: Family History  Problem Relation Age of Onset  . Hypertension Father     Allergies: No Known Allergies  Medications Prior to Admission  Medication Sig Dispense Refill Last Dose  . aspirin 81 MG chewable tablet Chew 1 tablet (81 mg total) by mouth daily. Start after 12 weeks 60 tablet 3   . ferrous sulfate 325 (65 FE) MG tablet Please specify directions, refills and quantity 1 tablet 0   .  omeprazole (PRILOSEC) 20 MG capsule Take 1 capsule (20 mg total) by mouth daily. 30 capsule 6   . Prenat-Fe Poly-Methfol-FA-DHA (VITAFOL ULTRA) 29-0.6-0.4-200 MG CAPS Take 1 capsule by mouth daily. 30 capsule 12     Review of Systems:  All systems reviewed and negative except as stated in HPI  PE: Blood pressure 140/87, pulse (!) 103, last menstrual period 07/19/2018, currently breastfeeding. General appearance: alert, cooperative and no distress, appears uncomfortable Lungs: regular rate and effort Heart: regular rate  Abdomen: soft, non-tender Extremities: Homans sign is negative, no sign of DVT Presentation: cephalic EFM: [id number] bpm, mod variability, + accels, no decels Toco: q2 Dilation: 6 Exam by:: Julianne Handler CNM  6/80/-1, vtx  Prenatal labs: ABO, Rh: A/RH(D) NEGATIVE/-- (07/17 1020) Antibody: NO ANTIBODIES DETECTED (12/04 0841) Rubella: 1.48 (07/17 1020) RPR: NON-REACTIVE (12/04 0828)   HBsAg: NON-REACTIVE (07/17 1020)  HIV: NON-REACTIVE (12/04 0828)  GBS: Positive/-- (01/29 0000)POS 2 hr GTT normal  Prenatal Transfer Tool  Maternal Diabetes: No Genetic Screening: Normal Maternal Ultrasounds/Referrals: Normal Fetal Ultrasounds or other Referrals:  None Maternal Substance Abuse:  No Significant Maternal Medications:  None Significant Maternal Lab Results: Group B Strep positive  No results found for this or any previous visit (from the past 24 hour(s)).  Patient Active Problem List   Diagnosis Date Noted  . Indication for care in labor or delivery 04/24/2019  . Excessive weight gain affecting pregnancy 02/25/2019  . Significant discrepancy between uterine size and clinical dates, antepartum 01/30/2019  . Supervision of other normal pregnancy, antepartum 09/10/2018  . History of gestational hypertension 12/06/2014   Assessment: Megan Powell is a 29 y.o. [id number] at [redacted]w[redacted]d here for labor  1. Labor: active 2. FWB: Cat I 3. Pain: analgesia/anesthesia prn 4. GBS: pos 5. Mild HTN  Plan: Admit to LD PCN PEC labs Anticipate LGA infant, pelvis proven to 8'12 Anticipate SVD  Julianne Handler, CNM  04/24/2019, 1:10 PM

## 2019-04-24 NOTE — Anesthesia Procedure Notes (Signed)
Epidural Patient location during procedure: OB Start time: 04/24/2019 1:30 PM End time: 04/24/2019 1:38 PM  Staffing Anesthesiologist: Albertha Ghee, MD Performed: anesthesiologist   Preanesthetic Checklist Completed: patient identified, IV checked, site marked, risks and benefits discussed, monitors and equipment checked, pre-op evaluation and timeout performed  Epidural Patient position: sitting Prep: DuraPrep Patient monitoring: heart rate, cardiac monitor, continuous pulse ox and blood pressure Approach: midline Location: L2-L3 Injection technique: LOR saline  Needle:  Needle type: Tuohy  Needle gauge: 17 G Needle length: 9 cm Needle insertion depth: 7 cm Catheter type: closed end flexible Catheter size: 19 Gauge Catheter at skin depth: 12 cm Test dose: negative and Other  Assessment Events: blood not aspirated, injection not painful, no injection resistance and negative IV test  Additional Notes Informed consent obtained prior to proceeding including risk of failure, 1% risk of PDPH, risk of minor discomfort and bruising.  Discussed rare but serious complications including epidural abscess, permanent nerve injury, epidural hematoma.  Discussed alternatives to epidural analgesia and patient desires to proceed.  Timeout performed pre-procedure verifying patient name, procedure, and platelet count.  Patient tolerated procedure well. Reason for block:procedure for pain

## 2019-04-25 ENCOUNTER — Inpatient Hospital Stay (HOSPITAL_COMMUNITY): Payer: Medicaid Other | Admitting: Certified Registered Nurse Anesthetist

## 2019-04-25 ENCOUNTER — Encounter (HOSPITAL_COMMUNITY)
Admission: AD | Disposition: A | Discharge status: Home / Self Care | Payer: Self-pay | Source: Home / Self Care | Attending: Obstetrics & Gynecology

## 2019-04-25 DIAGNOSIS — Z302 Encounter for sterilization: Secondary | ICD-10-CM

## 2019-04-25 HISTORY — PX: TUBAL LIGATION: SHX77

## 2019-04-25 LAB — RPR: RPR Ser Ql: NONREACTIVE

## 2019-04-25 SURGERY — LIGATION, FALLOPIAN TUBE, POSTPARTUM
Anesthesia: Epidural

## 2019-04-25 MED ORDER — LACTATED RINGERS IV SOLN: INTRAVENOUS | Status: DC

## 2019-04-25 MED ORDER — SODIUM CHLORIDE 0.9 % IR SOLN
Status: DC | PRN
  Administered 2019-04-25: 1000 mL

## 2019-04-25 MED ORDER — METOCLOPRAMIDE HCL 10 MG PO TABS
10.0000 mg | ORAL_TABLET | Freq: Once | ORAL | Status: AC
  Administered 2019-04-25: 10 mg via ORAL
  Filled 2019-04-25: qty 1

## 2019-04-25 MED ORDER — RHO D IMMUNE GLOBULIN 1500 UNIT/2ML IJ SOSY
300.0000 ug | PREFILLED_SYRINGE | Freq: Once | INTRAMUSCULAR | Status: AC
  Administered 2019-04-25: 300 ug via INTRAVENOUS
  Filled 2019-04-25: qty 2

## 2019-04-25 MED ORDER — LACTATED RINGERS IV SOLN: INTRAVENOUS | Status: DC | PRN

## 2019-04-25 MED ORDER — BUPIVACAINE HCL (PF) 0.25 % IJ SOLN
INTRAMUSCULAR | Status: DC | PRN
  Administered 2019-04-25: 10 mL

## 2019-04-25 MED ORDER — BUPIVACAINE HCL (PF) 0.25 % IJ SOLN
INTRAMUSCULAR | Status: AC
  Filled 2019-04-25: qty 30

## 2019-04-25 MED ORDER — FENTANYL CITRATE (PF) 100 MCG/2ML IJ SOLN: 25.0000 ug | INTRAMUSCULAR | Status: DC | PRN

## 2019-04-25 MED ORDER — OXYCODONE HCL 5 MG/5ML PO SOLN: 5.0000 mg | Freq: Once | ORAL | Status: DC | PRN

## 2019-04-25 MED ORDER — OXYCODONE HCL 5 MG PO TABS: 5.0000 mg | ORAL_TABLET | Freq: Once | ORAL | Status: DC | PRN

## 2019-04-25 MED ORDER — STERILE WATER FOR IRRIGATION IR SOLN
Status: DC | PRN
  Administered 2019-04-25: 1000 mL

## 2019-04-25 MED ORDER — FAMOTIDINE 20 MG PO TABS
40.0000 mg | ORAL_TABLET | Freq: Once | ORAL | Status: DC
  Filled 2019-04-25: qty 2

## 2019-04-25 MED ORDER — SODIUM BICARBONATE 8.4 % IV SOLN
INTRAVENOUS | Status: DC | PRN
  Administered 2019-04-25: 5 mL via EPIDURAL
  Administered 2019-04-25 (×2): 3 mL via EPIDURAL
  Administered 2019-04-25: 7 mL via EPIDURAL

## 2019-04-25 MED ORDER — FAMOTIDINE 20 MG PO TABS
40.0000 mg | ORAL_TABLET | Freq: Once | ORAL | Status: AC
  Administered 2019-04-25: 07:00:00 40 mg via ORAL
  Filled 2019-04-25: qty 2

## 2019-04-25 MED ORDER — LIDOCAINE-EPINEPHRINE (PF) 2 %-1:200000 IJ SOLN
INTRAMUSCULAR | Status: AC
  Filled 2019-04-25: qty 20

## 2019-04-25 MED ORDER — ONDANSETRON HCL 4 MG/2ML IJ SOLN: 4.0000 mg | Freq: Four times a day (QID) | INTRAMUSCULAR | Status: DC | PRN

## 2019-04-25 SURGICAL SUPPLY — 22 items
BLADE SURG 11 STRL SS (BLADE) ×3 IMPLANT
CLIP FILSHIE TUBAL LIGA STRL (Clip) ×3 IMPLANT
CLOTH BEACON ORANGE TIMEOUT ST (SAFETY) ×3 IMPLANT
DRSG OPSITE POSTOP 3X4 (GAUZE/BANDAGES/DRESSINGS) ×3 IMPLANT
DURAPREP 26ML APPLICATOR (WOUND CARE) ×3 IMPLANT
GLOVE BIO SURGEON STRL SZ 6.5 (GLOVE) ×2 IMPLANT
GLOVE BIO SURGEONS STRL SZ 6.5 (GLOVE) ×1
GLOVE BIOGEL PI IND STRL 7.0 (GLOVE) ×2 IMPLANT
GLOVE BIOGEL PI INDICATOR 7.0 (GLOVE) ×4
GOWN STRL REUS W/TWL LRG LVL3 (GOWN DISPOSABLE) ×6 IMPLANT
NEEDLE HYPO 22GX1.5 SAFETY (NEEDLE) ×3 IMPLANT
NS IRRIG 1000ML POUR BTL (IV SOLUTION) ×3 IMPLANT
PACK ABDOMINAL MINOR (CUSTOM PROCEDURE TRAY) ×3 IMPLANT
PROTECTOR NERVE ULNAR (MISCELLANEOUS) ×3 IMPLANT
SPONGE LAP 4X18 RFD (DISPOSABLE) IMPLANT
SUT VIC AB 0 CT1 27 (SUTURE) ×2
SUT VIC AB 0 CT1 27XBRD ANBCTR (SUTURE) ×1 IMPLANT
SUT VICRYL 4-0 PS2 18IN ABS (SUTURE) ×3 IMPLANT
SYR CONTROL 10ML LL (SYRINGE) ×3 IMPLANT
TOWEL OR 17X24 6PK STRL BLUE (TOWEL DISPOSABLE) ×6 IMPLANT
TRAY FOLEY CATH SILVER 16FR (SET/KITS/TRAYS/PACK) ×3 IMPLANT
WATER STERILE IRR 1000ML POUR (IV SOLUTION) ×3 IMPLANT

## 2019-04-25 NOTE — Lactation Note (Signed)
This note was copied from a baby's chart. Lactation Consultation Note  Patient Name: Boy Misato Randol [id number] Date: 04/25/2019  P4, 67 hour female, -4 weight loss. Per mom, infant had 5 stools ( meconium)and 3 voids today. Mom feels infant is latching well she has no concerns infant last breastfed at 8 pm for 20 minutes LC did not see a latch at this time infant asleep in bed. Per mom, infant is currently cluster feeding.. Mom brought her own coconut oil from home that she is using for sore nipples as well as her Spectra DEBP. Mom knows to call RN or [place] if she has questions or needs any assistance with latching infant at breast.  Mom did request a hand pump which LC gave her with 27 mm flange.  Mom shown how to use hand pump & how to disassemble, clean, & reassemble parts.   Maternal Data    Feeding Feeding Type: Breast Fed  LATCH Score                   Interventions    Lactation Tools Discussed/Used     Consult Status      Vicente Serene 04/25/2019, 9:09 PM

## 2019-04-25 NOTE — Anesthesia Preprocedure Evaluation (Signed)
Anesthesia Evaluation  Patient identified by MRN, date of birth, ID band Patient awake    Reviewed: Allergy & Precautions, H&P , NPO status , Patient's Chart, lab work & pertinent test results  Airway Mallampati: II   Neck ROM: full    Dental   Pulmonary neg pulmonary ROS,    breath sounds clear to auscultation       Cardiovascular hypertension,  Rhythm:regular Rate:Normal     Neuro/Psych    GI/Hepatic   Endo/Other    Renal/GU      Musculoskeletal   Abdominal   Peds  Hematology   Anesthesia Other Findings   Reproductive/Obstetrics                             Anesthesia Physical Anesthesia Plan  ASA: I  Anesthesia Plan: Epidural   Post-op Pain Management:    Induction: Intravenous  PONV Risk Score and Plan: 2 and Treatment may vary due to age or medical condition and Ondansetron  Airway Management Planned: Simple Face Mask  Additional Equipment:   Intra-op Plan:   Post-operative Plan:   Informed Consent: I have reviewed the patients History and Physical, chart, labs and discussed the procedure including the risks, benefits and alternatives for the proposed anesthesia with the patient or authorized representative who has indicated his/her understanding and acceptance.       Plan Discussed with: CRNA, Anesthesiologist and Surgeon  Anesthesia Plan Comments:         Anesthesia Quick Evaluation

## 2019-04-25 NOTE — Progress Notes (Signed)
Patient ID: Megan Powell, female   DOB: [birth year], 29 y.o.   MRN: [id number] 29 y.o. [id number] with undesired fertility,status post vaginal delivery, desires permanent sterilization. Risks and benefits of procedure discussed with patient including permanence of method, bleeding, infection, use of Filshie clips,  injury to surrounding organs and need for additional procedures. Risk failure of 0.5-1% with increased risk of ectopic gestation if pregnancy occurs was also discussed with patient. Patient verbalized understanding and all questions were answered.  Alvina Filbert Roselie Awkward MD 04/25/2019 8:50 AM

## 2019-04-25 NOTE — Anesthesia Postprocedure Evaluation (Signed)
Anesthesia Post Note  Patient: Megan Powell  Procedure(s) Performed: AN AD HOC LABOR EPIDURAL     Patient location during evaluation: Mother Baby Anesthesia Type: Epidural Level of consciousness: awake and alert Pain management: pain level controlled Vital Signs Assessment: post-procedure vital signs reviewed and stable Respiratory status: spontaneous breathing, nonlabored ventilation and respiratory function stable Cardiovascular status: stable Postop Assessment: no headache, no backache, epidural receding, no apparent nausea or vomiting, patient able to bend at knees, adequate PO intake and able to ambulate Anesthetic complications: no    Last Vitals:  Vitals:   04/25/19 0531 04/25/19 0751  BP: 104/73 116/78  Pulse: 78 85  Resp:  18  Temp: 36.9 C 37 C  SpO2: 100% 99%    Last Pain:  Vitals:   04/25/19 0752  TempSrc:   PainSc: 0-No pain   Pain Goal:                   [employer]

## 2019-04-25 NOTE — Op Note (Signed)
Megan Powell 04/24/2019 - 04/25/2019  PREOPERATIVE DIAGNOSIS:  Multiparity, undesired fertility  POSTOPERATIVE DIAGNOSIS:  Multiparity, undesired fertility  PROCEDURE:  Postpartum Bilateral Tubal Sterilization using Filshie Clips   ANESTHESIA:  Epidural  COMPLICATIONS:  None immediate.  ESTIMATED BLOOD LOSS:  Less than 20 ml.  FLUIDS: 500 ml LR.  URINE OUTPUT:  75 ml of clear urine.  INDICATIONS: 29 y.o. [id number]  with undesired fertility,status post vaginal delivery, desires permanent sterilization. Risks and benefits of procedure discussed with patient including permanence of method, bleeding, infection, injury to surrounding organs and need for additional procedures. Risk failure of 0.5-1% with increased risk of ectopic gestation if pregnancy occurs was also discussed with patient.   FINDINGS:  Normal uterus, tubes, and ovaries.  TECHNIQUE:  The patient was taken to the operating room where her epidural anesthesia was dosed up to surgical level and found to be adequate.  She was then placed in the dorsal supine position and prepped and draped in sterile fashion.  After an adequate timeout was performed, attention was turned to the patient's abdomen where a small transverse skin incision was made under the umbilical fold. The incision was taken down to the layer of fascia using the scalpel, and fascia was incised, and extended bilaterally using Mayo scissors. The peritoneum was entered in a sharp fashion. Attention was then turned to the patient's uterus, and left fallopian tube was identified and followed out to the fimbriated end.  A Filshie clip was placed on the left fallopian tube about 2 cm from the cornual attachment, with care given to incorporate the underlying mesosalpinx.  A similar process was carried out on the right side allowing for bilateral tubal sterilization.  Good hemostasis was noted overall.  Local analgesia was drizzled on both operative sites.The instruments were then  removed from the patient's abdomen and the fascial incision was repaired with 0 Vicryl, and the skin was closed with a 4-0 Vicryl subcuticular stitch. The patient tolerated the procedure well.  Sponge, lap, and needle counts were correct times two.  The patient was then taken to the recovery room awake, extubated and in stable condition.  Emeterio Reeve MD 04/25/2019 10:03 AM

## 2019-04-25 NOTE — Progress Notes (Signed)
Post Partum Day 1 Subjective: Patient reports feeling well. She is tolerating PO. Ambulating and urinating without difficulty. Lochia minimal. Desires to discharge today if baby can discharge.   Objective: Blood pressure 104/73, pulse 78, temperature 98.4 F (36.9 C), temperature source Oral, resp. rate 18, height 5\' 4"  (1.626 m), weight 97.5 kg, last menstrual period 07/19/2018, SpO2 100 %, unknown if currently breastfeeding.  Physical Exam:  General: alert, cooperative and appears stated age 29: appropriate Uterine Fundus: firm Incision: NA DVT Evaluation: No evidence of DVT seen on physical exam.  Recent Labs    04/24/19 1250  HGB 13.9  HCT 41.7    Assessment/Plan: Plan for discharge tomorrow; if baby can discharge today please page team for DC orders NPO at midnight for BTL this morning Vitals stable Breastfeeding   LOS: 1 day   Megan Powell 04/25/2019, 6:58 AM

## 2019-04-25 NOTE — Transfer of Care (Signed)
Immediate Anesthesia Transfer of Care Note  Patient: Megan Powell  Procedure(s) Performed: POST PARTUM TUBAL LIGATION (N/A )  Patient Location: PACU  Anesthesia Type:Epidural  Level of Consciousness: awake, alert  and oriented  Airway & Oxygen Therapy: Patient Spontanous Breathing  Post-op Assessment: Report given to RN and Post -op Vital signs reviewed and stable  Post vital signs: Reviewed and stable  Last Vitals:  Vitals Value Taken Time  BP 113/65 04/25/19 1330  Temp 37.1 C 04/25/19 1330  Pulse 77 04/25/19 1330  Resp 20 04/25/19 1145  SpO2 99 % 04/25/19 1330  Vitals shown include unvalidated device data.  Last Pain:  Vitals:   04/25/19 1330  TempSrc: Oral  PainSc: 0-No pain         Complications: No apparent anesthesia complications

## 2019-04-26 ENCOUNTER — Encounter: Payer: Self-pay | Admitting: *Deleted

## 2019-04-26 LAB — RH IG WORKUP (INCLUDES ABO/RH)
ABO/RH(D): A NEG
Fetal Screen: NEGATIVE
Gestational Age(Wks): 39.6
Unit division: 0

## 2019-04-26 MED ORDER — OXYCODONE HCL 5 MG PO TABS: 5.0000 mg | ORAL_TABLET | ORAL | 0 refills | Status: DC | PRN

## 2019-04-26 MED ORDER — SENNOSIDES-DOCUSATE SODIUM 8.6-50 MG PO TABS: 2.0000 | ORAL_TABLET | ORAL | 0 refills | Status: DC

## 2019-04-26 MED ORDER — IBUPROFEN 600 MG PO TABS: 600.0000 mg | ORAL_TABLET | Freq: Four times a day (QID) | ORAL | 0 refills | Status: DC

## 2019-04-26 MED ORDER — ACETAMINOPHEN 325 MG PO TABS: 650.0000 mg | ORAL_TABLET | Freq: Four times a day (QID) | ORAL | 0 refills | Status: DC | PRN

## 2019-04-26 NOTE — Lactation Note (Signed)
This note was copied from a baby's chart.   Mother is a P4, infant is 57 hours old and is now at 9% wt loss.  Assist mother with latching infant on . Infant bounces on and off frequently. Mother reports that he did this all night. She has supplemented him some with ebm in a spoon.  Assist mother with latching infant . Mother taught to do asymmetrical latch. Infant latched on with good strong tugging . Observed swallows for 10 mins.. infant placed on alternate breast and but refused.  Encouraged mother to hand express , then pump and supplement infant.   Observed that infant has a high palate. Suggested that she may try a NS. Mother was given a #24 NS. She was advised to post pump while using the shield and protect her milk supply. Discussed observing for milk in the shield.    Plan of Care : Breastfeed infant with feeding cues Supplement infant with ebm Pump using a DEBP after each feeding for 15-20 mins.  Discussed treatment and prevention of engorgement. Mother to continue to cue base feed infant and feed at least 8-12 times or more in 24 hours and advised to allow for cluster feeding infant as needed.   Mother to continue to due STS. Mother is aware of available LC services at [facility], BFSG'S, OP Dept, and phone # for questions or concerns about breastfeeding.  Mother receptive to all teaching and plan of care.    Mother receptive to all teaching and plan of care.    Patient Name: Megan Powell [id number] Date: 04/26/2019 Reason for consult: Follow-up assessment   Maternal Data    Feeding Feeding Type: Breast Fed  LATCH Score Latch: Repeated attempts needed to sustain latch, nipple held in mouth throughout feeding, stimulation needed to elicit sucking reflex.  Audible Swallowing: A few with stimulation  Type of Nipple: Everted at rest and after stimulation  Comfort (Breast/Nipple): Filling, red/small blisters or bruises, mild/mod discomfort  Hold (Positioning): Assistance  needed to correctly position infant at breast and maintain latch.  LATCH Score: 6  Interventions Interventions: Assisted with latch;Skin to skin;Hand express;Breast compression;Adjust position;Support pillows;Position options;Hand pump;DEBP  Lactation Tools Discussed/Used     Consult Status Consult Status: Complete    Darla Lesches 04/26/2019, 12:17 PM

## 2019-04-28 NOTE — Anesthesia Postprocedure Evaluation (Signed)
Anesthesia Post Note  Patient: Megan Powell  Procedure(s) Performed: POST PARTUM TUBAL LIGATION (N/A )     Patient location during evaluation: PACU Anesthesia Type: Epidural Level of consciousness: oriented and awake and alert Pain management: pain level controlled Vital Signs Assessment: post-procedure vital signs reviewed and stable Respiratory status: spontaneous breathing, respiratory function stable and patient connected to nasal cannula oxygen Cardiovascular status: blood pressure returned to baseline and stable Postop Assessment: no headache, no backache and no apparent nausea or vomiting Anesthetic complications: no    Last Vitals:  Vitals:   04/25/19 2356 04/26/19 0500  BP: 115/66 113/74  Pulse: 73 73  Resp: 18 18  Temp: 36.5 C 36.7 C  SpO2: 100% 100%    Last Pain:  Vitals:   04/26/19 1058  TempSrc:   PainSc: 0-No pain                 Lysbeth Dicola S

## 2019-05-01 ENCOUNTER — Encounter: Payer: Self-pay | Admitting: Women's Health

## 2019-05-01 ENCOUNTER — Other Ambulatory Visit: Payer: Self-pay

## 2019-05-01 ENCOUNTER — Ambulatory Visit: Payer: Medicaid Other | Admitting: Women's Health

## 2019-05-01 NOTE — Progress Notes (Signed)
Megan Powell is a 29 y.o. [id number] at ppartum s/p NSVD on 04/24/2019 and s/p BTL on 04/25/2019 who presents to clinic for a BP check. Pt denies any s/sx of preeclampsia. Pt has hx of gestational HTN but not this pregnancy. Highest discharge BP 127/78. BP at [redacted]w[redacted]d of pregnancy 135/81 - highest BP of her pregnancy. BP 140/87 in labor.  BP 130/78   Pulse 71   Ht 5\' 5"  (1.651 m)   Wt 203 lb (92.1 kg)   LMP 07/19/2018   Breastfeeding Yes   BMI 33.78 kg/m   Pt to home without meds, preeclampsia precautions given.  Clarisa Fling, NP  2:05 PM 05/02/2019

## 2019-05-02 NOTE — Patient Instructions (Signed)
Postpartum Hypertension Postpartum hypertension is high blood pressure that remains higher than normal after childbirth. You may not realize that you have postpartum hypertension if your blood pressure is not being checked regularly. In most cases, postpartum hypertension will go away on its own, usually within a week of delivery. However, for some women, medical treatment is required to prevent serious complications, such as seizures or stroke. What are the causes? This condition may be caused by one or more of the following:  Hypertension that existed before pregnancy (chronic hypertension).  Hypertension that comes on as a result of pregnancy (gestational hypertension).  Hypertensive disorders during pregnancy (preeclampsia) or seizures in women who have high blood pressure during pregnancy (eclampsia).  A condition in which the liver, platelets, and red blood cells are damaged during pregnancy (HELLP syndrome).  A condition in which the thyroid produces too much hormones (hyperthyroidism).  Other rare problems of the nerves (neurological disorders) or blood disorders. In some cases, the cause may not be known. What increases the risk? The following factors may make you more likely to develop this condition:  Chronic hypertension. In some cases, this may not have been diagnosed before pregnancy.  Obesity.  Type 2 diabetes.  Kidney disease.  History of preeclampsia or eclampsia.  Other medical conditions that change the level of hormones in the body (hormonal imbalance). What are the signs or symptoms? As with all types of hypertension, postpartum hypertension may not have any symptoms. Depending on how high your blood pressure is, you may experience:  Headaches. These may be mild, moderate, or severe. They may also be steady, constant, or sudden in onset (thunderclap headache).  Changes in your ability to see (visual changes).  Dizziness.  Shortness of breath.  Swelling  of your hands, feet, lower legs, or face. In some cases, you may have swelling in more than one of these locations.  Heart palpitations or a racing heartbeat.  Difficulty breathing while lying down.  Decrease in the amount of urine that you pass. Other rare signs and symptoms may include:  Sweating more than usual. This lasts longer than a few days after delivery.  Chest pain.  Sudden dizziness when you get up from sitting or lying down.  Seizures.  Nausea or vomiting.  Abdominal pain. How is this diagnosed? This condition may be diagnosed based on the results of a physical exam, blood pressure measurements, and blood and urine tests. You may also have other tests, such as a CT scan or an MRI, to check for other problems of postpartum hypertension. How is this treated? If blood pressure is high enough to require treatment, your options may include:  Medicines to reduce blood pressure (antihypertensives). Tell your health care provider if you are breastfeeding or if you plan to breastfeed. There are many antihypertensive medicines that are safe to take while breastfeeding.  Stopping medicines that may be causing hypertension.  Treating medical conditions that are causing hypertension.  Treating the complications of hypertension, such as seizures, stroke, or kidney problems. Your health care provider will also continue to monitor your blood pressure closely until it is within a safe range for you. Follow these instructions at home:  Take over-the-counter and prescription medicines only as told by your health care provider.  Return to your normal activities as told by your health care provider. Ask your health care provider what activities are safe for you.  Do not use any products that contain nicotine or tobacco, such as cigarettes and e-cigarettes. If   you need help quitting, ask your health care provider.  Keep all follow-up visits as told by your health care provider. This  is important. Contact a health care provider if:  Your symptoms get worse.  You have new symptoms, such as: ? A headache that does not get better. ? Dizziness. ? Visual changes. Get help right away if:  You suddenly develop swelling in your hands, ankles, or face.  You have sudden, rapid weight gain.  You develop difficulty breathing, chest pain, racing heartbeat, or heart palpitations.  You develop severe pain in your abdomen.  You have any symptoms of a stroke. "BE FAST" is an easy way to remember the main warning signs of a stroke: ? B - Balance. Signs are dizziness, sudden trouble walking, or loss of balance. ? E - Eyes. Signs are trouble seeing or a sudden change in vision. ? F - Face. Signs are sudden weakness or numbness of the face, or the face or eyelid drooping on one side. ? A - Arms. Signs are weakness or numbness in an arm. This happens suddenly and usually on one side of the body. ? S - Speech. Signs are sudden trouble speaking, slurred speech, or trouble understanding what people say. ? T - Time. Time to call emergency services. Write down what time symptoms started.  You have other signs of a stroke, such as: ? A sudden, severe headache with no known cause. ? Nausea or vomiting. ? Seizure. These symptoms may represent a serious problem that is an emergency. Do not wait to see if the symptoms will go away. Get medical help right away. Call your local emergency services (911 in the [place] Do not drive yourself to the hospital. Summary  Postpartum hypertension is high blood pressure that remains higher than normal after childbirth.  In most cases, postpartum hypertension will go away on its own, usually within a week of delivery.  For some women, medical treatment is required to prevent serious complications, such as seizures or stroke. This information is not intended to replace advice given to you by your health care provider. Make sure you discuss any questions  you have with your health care provider. Document Revised: 03/21/2018 Document Reviewed: 12/03/2016 Elsevier Patient Education  [place]. Preeclampsia and Eclampsia Preeclampsia is a serious condition that may develop during pregnancy. This condition causes high blood pressure and increased protein in your urine along with other symptoms, such as headaches and vision changes. These symptoms may develop as the condition gets worse. Preeclampsia may occur at 20 weeks of pregnancy or later. Diagnosing and treating preeclampsia early is very important. If not treated early, it can cause serious problems for you and your baby. One problem it can lead to is eclampsia. Eclampsia is a condition that causes muscle jerking or shaking (convulsions or seizures) and other serious problems for the mother. During pregnancy, delivering your baby may be the best treatment for preeclampsia or eclampsia. For most women, preeclampsia and eclampsia symptoms go away after giving birth. In rare cases, a woman may develop preeclampsia after giving birth (postpartum preeclampsia). This usually occurs within 48 hours after childbirth but may occur up to 6 weeks after giving birth. What are the causes? The cause of preeclampsia is not known. What increases the risk? The following risk factors make you more likely to develop preeclampsia:  Being pregnant for the first time.  Having had preeclampsia during a past pregnancy.  Having a family history of preeclampsia.  Having high  blood pressure.  Being pregnant with more than one baby.  Being 58 or older.  Being African-American.  Having kidney disease or diabetes.  Having medical conditions such as lupus or blood diseases.  Being very overweight (obese). What are the signs or symptoms? The most common symptoms are:  Severe headaches.  Vision problems, such as blurred or double vision.  Abdominal pain, especially upper abdominal pain. Other  symptoms that may develop as the condition gets worse include:  Sudden weight gain.  Sudden swelling of the hands, face, legs, and feet.  Severe nausea and vomiting.  Numbness in the face, arms, legs, and feet.  Dizziness.  Urinating less than usual.  Slurred speech.  Convulsions or seizures. How is this diagnosed? There are no screening tests for preeclampsia. Your health care provider will ask you about symptoms and check for signs of preeclampsia during your prenatal visits. You may also have tests that include:  Checking your blood pressure.  Urine tests to check for protein. Your health care provider will check for this at every prenatal visit.  Blood tests.  Monitoring your baby's heart rate.  Ultrasound. How is this treated? You and your health care provider will determine the treatment approach that is best for you. Treatment may include:  Having more frequent prenatal exams to check for signs of preeclampsia, if you have an increased risk for preeclampsia.  Medicine to lower your blood pressure.  Staying in the hospital, if your condition is severe. There, treatment will focus on controlling your blood pressure and the amount of fluids in your body (fluid retention).  Taking medicine (magnesium sulfate) to prevent seizures. This may be given as an injection or through an IV.  Taking a low-dose aspirin during your pregnancy.  Delivering your baby early. You may have your labor started with medicine (induced), or you may have a cesarean delivery. Follow these instructions at home: Eating and drinking   Drink enough fluid to keep your urine pale yellow.  Avoid caffeine. Lifestyle  Do not use any products that contain nicotine or tobacco, such as cigarettes and e-cigarettes. If you need help quitting, ask your health care provider.  Do not use alcohol or drugs.  Avoid stress as much as possible. Rest and get plenty of sleep. General instructions  Take  over-the-counter and prescription medicines only as told by your health care provider.  When lying down, lie on your left side. This keeps pressure off your major blood vessels.  When sitting or lying down, raise (elevate) your feet. Try putting some pillows underneath your lower legs.  Exercise regularly. Ask your health care provider what kinds of exercise are best for you.  Keep all follow-up and prenatal visits as told by your health care provider. This is important. How is this prevented? There is no known way of preventing preeclampsia or eclampsia from developing. However, to lower your risk of complications and detect problems early:  Get regular prenatal care. Your health care provider may be able to diagnose and treat the condition early.  Maintain a healthy weight. Ask your health care provider for help managing weight gain during pregnancy.  Work with your health care provider to manage any long-term (chronic) health conditions you have, such as diabetes or kidney problems.  You may have tests of your blood pressure and kidney function after giving birth.  Your health care provider may have you take low-dose aspirin during your next pregnancy. Contact a health care provider if:  You have  symptoms that your health care provider told you may require more treatment or monitoring, such as: ? Headaches. ? Nausea or vomiting. ? Abdominal pain. ? Dizziness. ? Light-headedness. Get help right away if:  You have severe: ? Abdominal pain. ? Headaches that do not get better. ? Dizziness. ? Vision problems. ? Confusion. ? Nausea or vomiting.  You have any of the following: ? A seizure. ? Sudden, rapid weight gain. ? Sudden swelling in your hands, ankles, or face. ? Trouble moving any part of your body. ? Numbness in any part of your body. ? Trouble speaking. ? Abnormal bleeding.  You faint. Summary  Preeclampsia is a serious condition that may develop during  pregnancy.  This condition causes high blood pressure and increased protein in your urine along with other symptoms, such as headaches and vision changes.  Diagnosing and treating preeclampsia early is very important. If not treated early, it can cause serious problems for you and your baby.  Get help right away if you have symptoms that your health care provider told you to watch for. This information is not intended to replace advice given to you by your health care provider. Make sure you discuss any questions you have with your health care provider. Document Revised: 10/15/2017 Document Reviewed: 09/19/2015 Elsevier Patient Education  [place].

## 2019-05-28 ENCOUNTER — Other Ambulatory Visit: Payer: Self-pay | Admitting: Obstetrics & Gynecology

## 2019-06-05 ENCOUNTER — Telehealth (INDEPENDENT_AMBULATORY_CARE_PROVIDER_SITE_OTHER): Payer: Medicaid Other

## 2019-06-05 DIAGNOSIS — Z9851 Tubal ligation status: Secondary | ICD-10-CM

## 2019-06-05 NOTE — Progress Notes (Signed)
TELEHEALTH VIRTUAL POSTPARTUM VISIT ENCOUNTER NOTE  I connected with@ on 06/05/19 at  9:10 AM EDT by telephone at home and verified that I am speaking with the correct person using two identifiers.   I discussed the limitations, risks, security and privacy concerns of performing an evaluation and management service by telephone and the availability of in person appointments. I also discussed with the patient that there may be a patient responsible charge related to this service. The patient expressed understanding and agreed to proceed.  Appointment Date: 06/05/2019  [facility]: [facility]  Chief Complaint:  Postpartum Visit  History of Present Illness: Megan Powell is a 29 y.o. Caucasian [phone] (Patient's last menstrual period was 07/19/2018.), seen for the above chief complaint. Her past medical history is significant for gHTN and S/P BTL.   She is s/p normal spontaneous vaginal delivery on Apr 24, 2019 at 39.6 weeks; she was discharged to home on D#2. Pregnancy complicated by gHTN. Baby is doing well.  Complains of questionable hernia that causes some discomfort when it's hit or with getting in and out of the vehicle.  Patient denies issues with constipation and reports a regime of every other day.  Patient denies issues with urination.  Patient has resumed sexual activity and has had no pain or discomfort. Patient states she has not checked her blood pressure.  Patient reports she pumps most days.  Patient in school for radiation therapy and reports it's going well.    Vaginal bleeding or discharge: No  Mode of feeding infant: Breast Intercourse: Yes  Contraception: bilateral tubal ligation PP depression s/s: No .  Any bowel or bladder issues: No  Pap smear: no abnormalities (date: June 2018)  Review of Systems:  Her 12 point review of systems is negative or as noted in the History of Present Illness.  Patient Active Problem List   Diagnosis Date Noted  . Indication for  care in labor or delivery 04/24/2019  . Rh negative status during pregnancy 04/24/2019  . SVD (spontaneous vaginal delivery) 04/24/2019  . Excessive weight gain affecting pregnancy 02/25/2019  . Significant discrepancy between uterine size and clinical dates, antepartum 01/30/2019  . Supervision of other normal pregnancy, antepartum 09/10/2018  . History of gestational hypertension 12/06/2014    Medications Megan Powell had no medications administered during this visit. Current Outpatient Medications  Medication Sig Dispense Refill  . acetaminophen (TYLENOL) 325 MG tablet Take 2 tablets (650 mg total) by mouth every 6 (six) hours as needed (for pain scale < 4). 30 tablet 0  . docusate sodium (COLACE) 100 MG capsule Take 100 mg by mouth daily as needed for mild constipation.    Marland Kitchen ibuprofen (ADVIL) 600 MG tablet Take 1 tablet (600 mg total) by mouth every 6 (six) hours. 30 tablet 0  . omeprazole (PRILOSEC) 20 MG capsule Take 1 capsule (20 mg total) by mouth daily. (Patient not taking: Reported on 05/01/2019) 30 capsule 6  . oxyCODONE (ROXICODONE) 5 MG immediate release tablet Take 1 tablet (5 mg total) by mouth every 4 (four) hours as needed for severe pain. (Patient not taking: Reported on 05/01/2019) 8 tablet 0  . Prenat-Fe Poly-Methfol-FA-DHA (VITAFOL ULTRA) 29-0.6-0.4-200 MG CAPS Take 1 capsule by mouth daily. 30 capsule 12  . Probiotic CAPS Take 2 capsules by mouth at bedtime.    . senna-docusate (SENOKOT-S) 8.6-50 MG tablet Take 2 tablets by mouth daily. (Patient not taking: Reported on 05/01/2019) 30 tablet 0   No current facility-administered medications for  this visit.    Allergies Patient has no known allergies.  Physical Exam:  General:  Alert, oriented and cooperative.   Mental Status: Normal mood and affect perceived. Normal judgment and thought content.  Rest of physical exam deferred due to type of encounter  PP Depression Screening:    Assessment:Patient is a 29 y.o.  [id number] who is 6 weeks postpartum from a normal spontaneous vaginal delivery.  She is doing well.   Plan: 29 year old  6 weeks Postpartum Normal Involution S/P BTL Breastfeeding Hernia  1. Postpartum state -Okay to resume normal activities. -Informed of need for annual exam for pap. -Encouraged to call if q/c arise.   2. S/P tubal ligation -After care instructions revisited. -Informed that sutures may be present, but should dissolve without issues. -Encouraged to call if pain or discomfort arises from area.  -Discussed possible hernia and instructed to monitor for changes in bowel pattern or prolonged pain at site. *Patient able to easily reduce hernia while on virtual visit with provider.   RTC in 3-6 months for annual visit or prn.   I discussed the assessment and treatment plan with the patient. The patient was provided an opportunity to ask questions and all were answered. The patient agreed with the plan and demonstrated an understanding of the instructions.   The patient was advised to call back or seek an in-person evaluation/go to the ED for any concerning postpartum symptoms.  I provided 10 minutes of non-face-to-face time during this encounter.   Maryann Conners, [place] for [employer], [place]

## 2019-10-20 ENCOUNTER — Encounter: Payer: Self-pay | Admitting: Certified Nurse Midwife

## 2019-10-20 ENCOUNTER — Other Ambulatory Visit (HOSPITAL_COMMUNITY)
Admission: RE | Admit: 2019-10-20 | Discharge: 2019-10-20 | Disposition: A | Discharge status: Home / Self Care | Payer: Medicaid Other | Source: Ambulatory Visit | Attending: Certified Nurse Midwife | Admitting: Certified Nurse Midwife

## 2019-10-20 ENCOUNTER — Ambulatory Visit (INDEPENDENT_AMBULATORY_CARE_PROVIDER_SITE_OTHER): Payer: Medicaid Other | Admitting: Certified Nurse Midwife

## 2019-10-20 ENCOUNTER — Other Ambulatory Visit: Payer: Self-pay

## 2019-10-20 VITALS — BP 123/67 | HR 80 | Ht 64.0 in | Wt 188.0 lb

## 2019-10-20 DIAGNOSIS — Z01419 Encounter for gynecological examination (general) (routine) without abnormal findings: Secondary | ICD-10-CM | POA: Diagnosis not present

## 2019-10-20 DIAGNOSIS — Z124 Encounter for screening for malignant neoplasm of cervix: Secondary | ICD-10-CM

## 2019-10-20 NOTE — Progress Notes (Signed)
GYNECOLOGY ANNUAL PREVENTATIVE CARE ENCOUNTER NOTE  History:     Megan Powell is a 29 y.o. [phone] female here for a routine annual gynecologic exam.  Current complaints: none.   Denies abnormal vaginal bleeding, discharge, pelvic pain, problems with intercourse or other gynecologic concerns.    Gynecologic History Patient's last menstrual period was 10/05/2019. Contraception: tubal ligation Last Pap: 07/2016. Results were: normal with negative HPV  Obstetric History OB History  Gravida Para Term Preterm AB Living  4 4 4     4   SAB TAB Ectopic Multiple Live Births        0 4    # Outcome Date GA Lbr Len/2nd Weight Sex Delivery Anes PTL Lv  4 Term 04/24/19 101w6d 06:02 / 00:05 9 lb 0.3 oz (4.09 kg) M Vag-Spont EPI  LIV  3 Term 03/29/17 [redacted]w[redacted]d 01:56 / 00:06 8 lb 7.3 oz (3.836 kg) F Vag-Spont EPI  LIV  2 Term 07/22/15 [redacted]w[redacted]d / 00:11 8 lb 11.9 oz (3.966 kg) M Vag-Spont EPI  LIV  1 Term 11/19/13 [redacted]w[redacted]d 07:56 / 00:59 8 lb (3.629 kg) M Vag-Spont EPI  LIV     Complications: Gestational hypertension    Past Medical History:  Diagnosis Date  . History of gestational hypertension 12/06/2014   - baby ASA after 12 weeks - baseline labs  . Hypertension affecting pregnancy   . Pregnancy induced hypertension   . Rh negative status during pregnancy 04/24/2019  . Significant discrepancy between uterine size and clinical dates, antepartum 01/30/2019   Growth [place] ordered>29 wks 86%ile, AFI 11    Past Surgical History:  Procedure Laterality Date  . NO PAST SURGERIES    . TUBAL LIGATION N/A 04/25/2019   Procedure: POST PARTUM TUBAL LIGATION;  Surgeon: Woodroe Mode, MD;  Location: [facility] LD ORS;  Service: Gynecology;  Laterality: N/A;    Current Outpatient Medications on File Prior to Visit  Medication Sig Dispense Refill  . acetaminophen (TYLENOL) 325 MG tablet Take 2 tablets (650 mg total) by mouth every 6 (six) hours as needed (for pain scale < 4). (Patient not taking: Reported on 06/05/2019) 30  tablet 0  . docusate sodium (COLACE) 100 MG capsule Take 100 mg by mouth daily as needed for mild constipation. (Patient not taking: Reported on 10/20/2019)    . ibuprofen (ADVIL) 600 MG tablet Take 1 tablet (600 mg total) by mouth every 6 (six) hours. (Patient not taking: Reported on 06/05/2019) 30 tablet 0  . omeprazole (PRILOSEC) 20 MG capsule Take 1 capsule (20 mg total) by mouth daily. (Patient not taking: Reported on 05/01/2019) 30 capsule 6  . oxyCODONE (ROXICODONE) 5 MG immediate release tablet Take 1 tablet (5 mg total) by mouth every 4 (four) hours as needed for severe pain. (Patient not taking: Reported on 05/01/2019) 8 tablet 0  . Prenat-Fe Poly-Methfol-FA-DHA (VITAFOL ULTRA) 29-0.6-0.4-200 MG CAPS Take 1 capsule by mouth daily. (Patient not taking: Reported on 06/05/2019) 30 capsule 12  . Probiotic CAPS Take 2 capsules by mouth at bedtime. (Patient not taking: Reported on 10/20/2019)    . senna-docusate (SENOKOT-S) 8.6-50 MG tablet Take 2 tablets by mouth daily. (Patient not taking: Reported on 05/01/2019) 30 tablet 0   No current facility-administered medications on file prior to visit.    No Known Allergies  Social History:  reports that she has never smoked. She has never used smokeless tobacco. She reports that she does not drink alcohol and does not use drugs.  Family History  Problem Relation Age of Onset  . Hypertension Father     The following portions of the patient's history were reviewed and updated as appropriate: allergies, current medications, past family history, past medical history, past social history, past surgical history and problem list.  Review of Systems Pertinent items noted in HPI and remainder of comprehensive ROS otherwise negative.  Physical Exam:  BP 123/67   Pulse 80   Ht 5\' 4"  (1.626 m)   Wt 188 lb (85.3 kg)   LMP 10/05/2019   Breastfeeding No   BMI 32.27 kg/m  CONSTITUTIONAL: Well-developed, well-nourished female in no acute distress.  HENT:   Normocephalic, atraumatic, External right and left ear normal. Oropharynx is clear and moist EYES: Conjunctivae and EOM are normal. Pupils are equal, round, and reactive to light. NECK: Normal range of motion, supple, no masses.  Normal thyroid.  SKIN: Skin is warm and dry. No rash noted. Not diaphoretic. No erythema. No pallor. MUSCULOSKELETAL: Normal range of motion. No tenderness.  No cyanosis, clubbing, or edema.  2+ distal pulses. NEUROLOGIC: Alert and oriented to person, place, and time. Normal reflexes, muscle tone coordination.  PSYCHIATRIC: Normal mood and affect. Normal behavior. Normal judgment and thought content. CARDIOVASCULAR: Normal heart rate noted, regular rhythm RESPIRATORY: Clear to auscultation bilaterally. Effort and breath sounds normal, no problems with respiration noted. BREASTS: Symmetric in size. No masses, tenderness, skin changes, nipple drainage, or lymphadenopathy bilaterally. Performed in the presence of a chaperone. ABDOMEN: Soft, no distention noted.  No tenderness, rebound or guarding.  PELVIC: Normal appearing external genitalia and urethral meatus; normal appearing vaginal mucosa and cervix.  No abnormal discharge noted.  Pap smear obtained.  Normal uterine size, no other palpable masses, no uterine or adnexal tenderness.  Performed in the presence of a chaperone.   Assessment and Plan:    1. Women's annual routine gynecological examination - normal well woman examination  - patient reports cycle 8-9 days since BTL, 3 heavy days while rest spotting  - Educated and discussed contraception options for bleeding since patient has BTL, patient declines at this time.  - Educated and discussed recommendations of pap every 3 years if normal results from today   2. Encounter for Papanicolaou smear for cervical cancer screening - Cytology - PAP( [place])  Will follow up results of pap smear and manage accordingly. Routine preventative health maintenance measures  emphasized. Please refer to After Visit Summary for other counseling recommendations.      Lajean Manes, [place] for [employer], [place]

## 2019-10-21 LAB — CYTOLOGY - PAP: Diagnosis: NEGATIVE

## 2019-11-03 DIAGNOSIS — J029 Acute pharyngitis, unspecified: Secondary | ICD-10-CM | POA: Diagnosis not present

## 2019-11-03 DIAGNOSIS — U071 COVID-19: Secondary | ICD-10-CM | POA: Diagnosis not present

## 2019-11-03 DIAGNOSIS — R05 Cough: Secondary | ICD-10-CM | POA: Diagnosis not present

## 2019-11-03 DIAGNOSIS — R0981 Nasal congestion: Secondary | ICD-10-CM | POA: Diagnosis not present

## 2019-11-03 DIAGNOSIS — Z1152 Encounter for screening for COVID-19: Secondary | ICD-10-CM | POA: Diagnosis not present

## 2020-02-08 ENCOUNTER — Encounter: Payer: Self-pay | Admitting: General Practice

## 2020-02-23 IMAGING — US US MFM OB FOLLOW-UP
1 series · 13 of 28 positions shown · non-contrast
Comparison: none

[Series 1: us mfm ob follow-up · 13 of 30 slices shown]
[im 2/30]
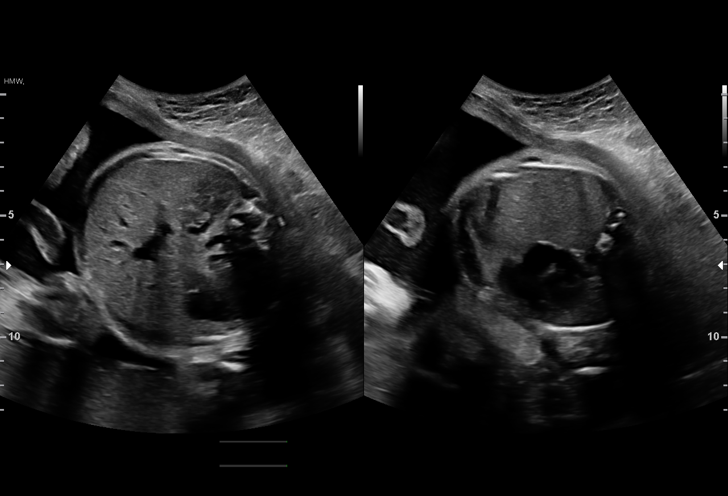
[im 4/30]
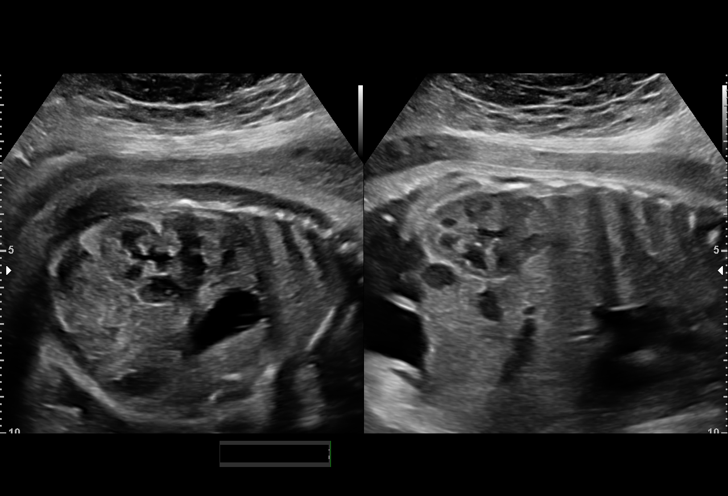
[im 6/30]
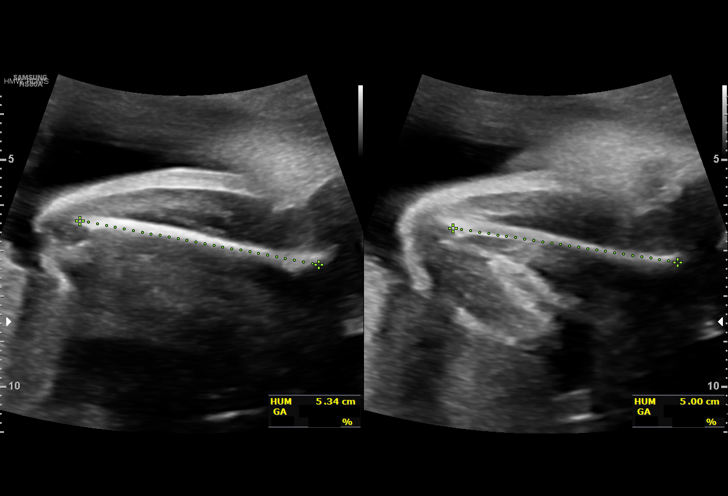
[im 8/30]
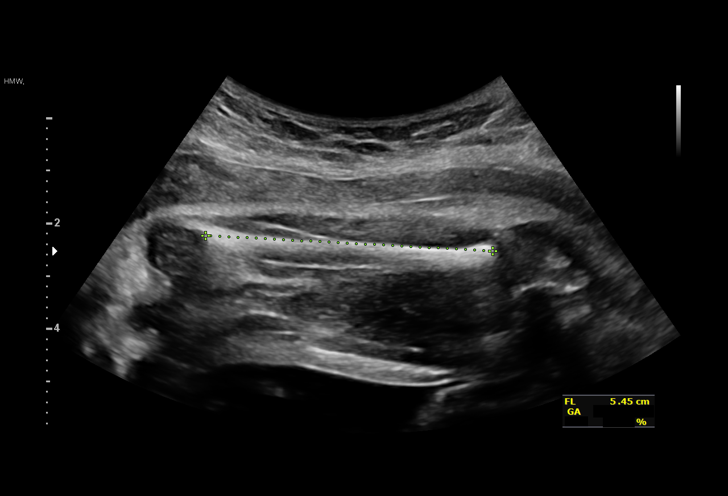
[im 10/30]
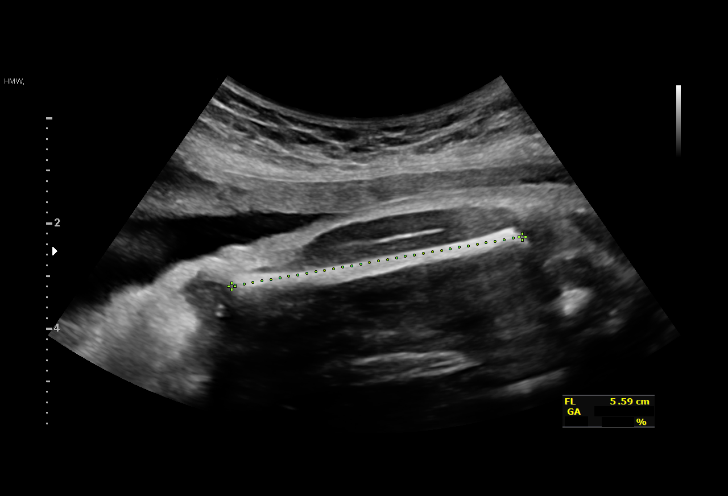
[im 12/30]
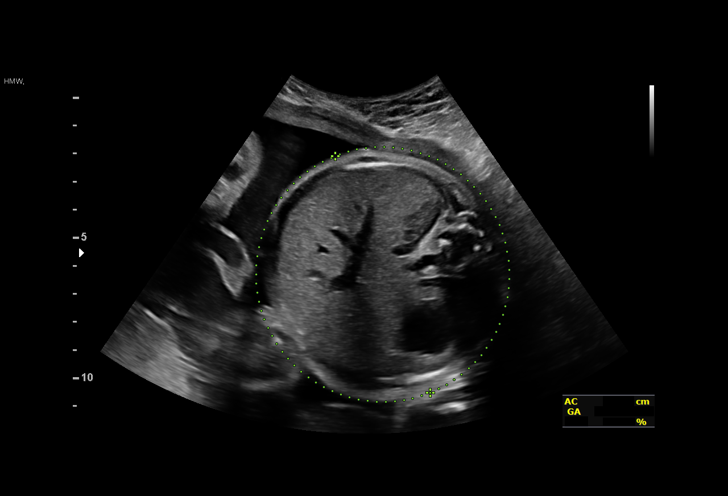
[im 16/30]
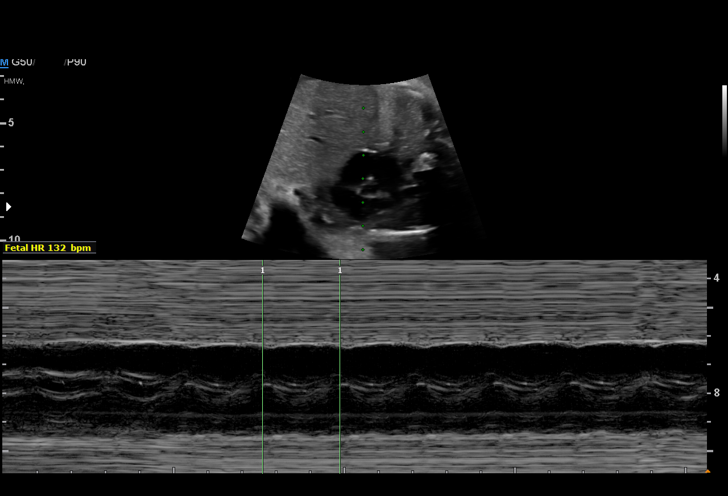
[im 18/30]
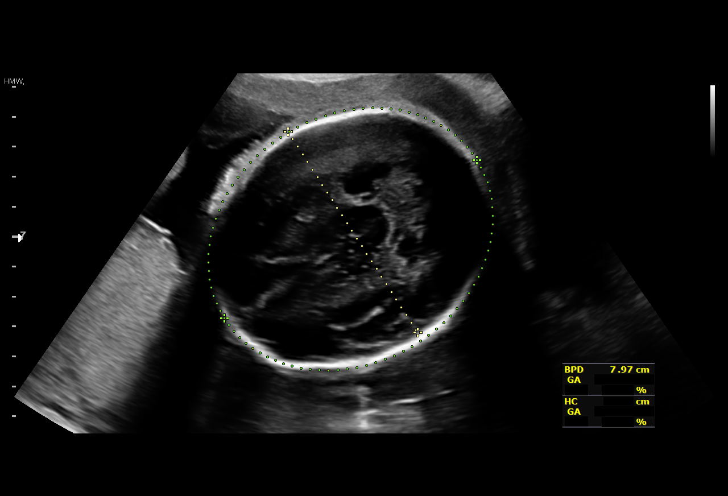
[im 20/30]
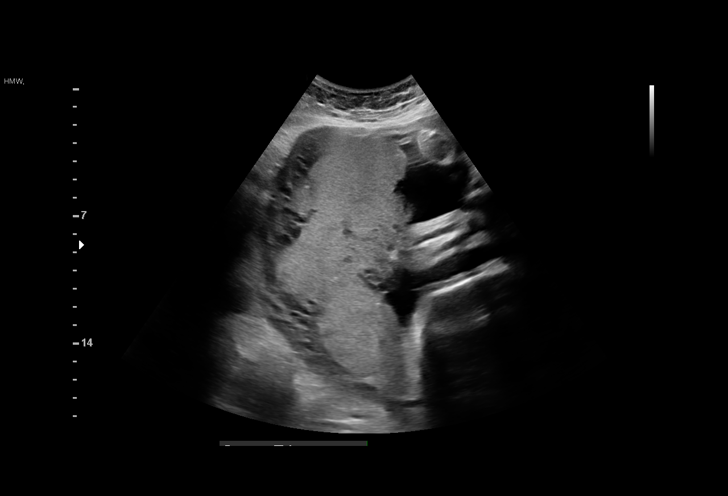
[im 22/30]
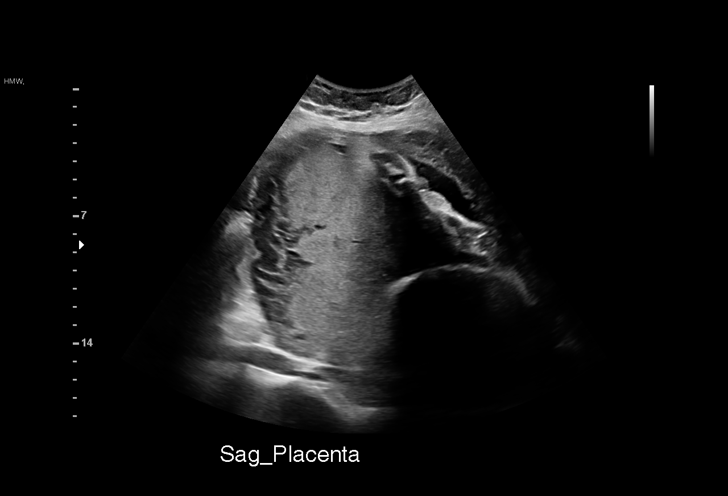
[im 24/30]
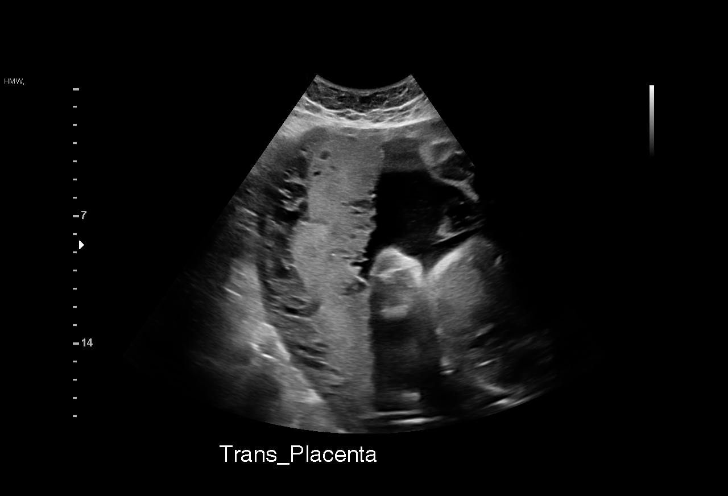
[im 26/30]
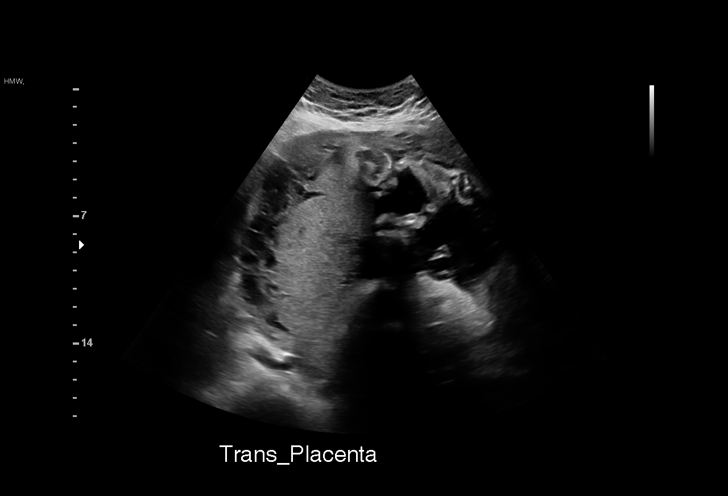
[im 28/30]
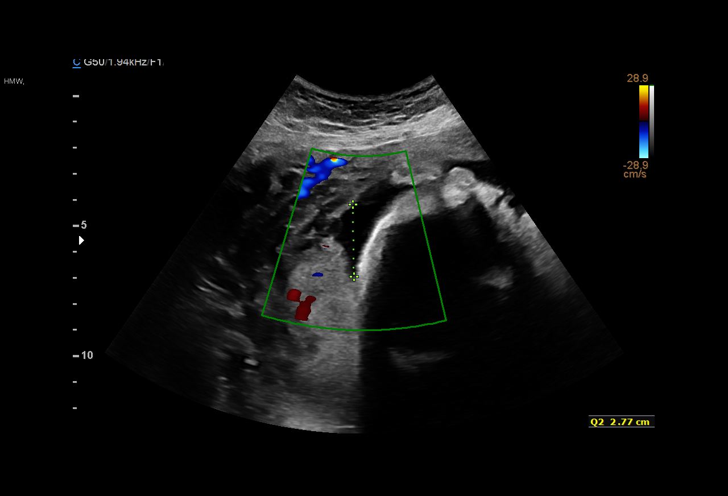

[13 of 28 positions shown; findings below may reference images not displayed]

[facility]

 ----------------------------------------------------------------------

 ----------------------------------------------------------------------
Indications

  29 weeks gestation of pregnancy
  Encounter for antenatal screening for
  malformations (neg horizon, pending AFP)
  Poor obstetric history: Previous
  preeclampsia / eclampsia/gestational HTN
  Obesity complicating pregnancy
  Uterine size-date discrepancy, third trimester
 ----------------------------------------------------------------------
Vital Signs

                                                Height:        5'4"
Fetal Evaluation

 Num Of Fetuses:         1
 Fetal Heart Rate(bpm):  132
 Cardiac Activity:       Observed
 Presentation:           Cephalic
 Placenta:               Posterior
 P. Cord Insertion:      Previously Visualized

 Amniotic Fluid
 AFI FV:      Within normal limits

 AFI Sum(cm)     %Tile       Largest Pocket(cm)
 11              21

 RUQ(cm)       RLQ(cm)       LUQ(cm)        LLQ(cm)
 0
Biometry

 BPD:      79.6  mm     G. Age:  32w 0d         92  %    CI:         76.1   %    70 - 86
                                                         FL/HC:      19.2   %    19.2 -
 HC:      289.2  mm     G. Age:  31w 6d         73  %    HC/AC:      1.02        0.99 -
 AC:      282.8  mm     G. Age:  32w 2d         96  %    FL/BPD:     69.6   %    71 - 87
 FL:       55.4  mm     G. Age:  29w 1d         19  %    FL/AC:      19.6   %    20 - 24
 HUM:      51.7  mm     G. Age:  30w 1d         58  %

 Est. FW:    8676  gm    3 lb 13 oz      86  %
OB History

 Gravidity:    4         Term:   3
Gestational Age

 LMP:           29w 6d        Date:  07/19/18                 EDD:   04/25/19
 U/S Today:     31w 2d                                        EDD:   04/15/19
 Best:          29w 6d     Det. By:  LMP  (07/19/18)          EDD:   04/25/19
Anatomy

 Cranium:               Appears normal         LVOT:                   Appears normal
 Cavum:                 Previously seen        Aortic Arch:            Previously seen
 Ventricles:            Previously seen        Ductal Arch:            Previously seen
 Choroid Plexus:        Previously seen        Diaphragm:              Previously seen
 Cerebellum:            Previously seen        Stomach:                Appears normal, left
                                                                       sided
 Posterior Fossa:       Previously seen        Abdomen:                Previously seen
 Nuchal Fold:           Previously seen        Abdominal Wall:         Previously seen
 Face:                  Orbits and profile     Cord Vessels:           Previously seen
                        previously seen
 Lips:                  Previously seen        Kidneys:                Appear normal
 Palate:                Previously seen        Bladder:                Appears normal
 Thoracic:              Appears normal         Spine:                  Previously seen
 Heart:                 Previously seen        Upper Extremities:      Previously seen
 RVOT:                  Appears normal         Lower Extremities:      Previously seen

 Other:  Parents do not wish to know sex of fetus. Heels and 5th digit
         visualized previously. Nasal bone visualized previously.
Cervix Uterus Adnexa

 Cervix
 Not visualized (advanced GA >65wks)

 Uterus
 No abnormality visualized.

 Left Ovary
 No adnexal mass visualized.

 Right Ovary
 No adnexal mass visualized.

 Cul De Sac
 No free fluid seen.

 Adnexa
 No abnormality visualized.
Impression

 Suspected large for gestational age fetus on clinical
 examination.  Patient is here for fetal growth assessment.

 Amniotic fluid is normal and good fetal activity is seen. Fetal
 growth is appropriate for gestational age.

 She does not have gestational diabetes.

 Patient reports all her previous children weighed more than 8
 pounds at birth.  She had a term vaginal deliveries.

 We reassured the patient of the findings.  Ultrasound has
 limitations and accurately estimating fetal weights.
Recommendations

 -An appointment was made for her to return in 6 weeks for
 fetal growth assessment.
                 Canlas, Jene

## 2020-06-29 ENCOUNTER — Encounter: Payer: Self-pay | Admitting: Medical-Surgical

## 2020-06-29 ENCOUNTER — Other Ambulatory Visit: Payer: Self-pay

## 2020-06-29 ENCOUNTER — Ambulatory Visit (INDEPENDENT_AMBULATORY_CARE_PROVIDER_SITE_OTHER): Payer: No Typology Code available for payment source | Admitting: Medical-Surgical

## 2020-06-29 VITALS — BP 104/68 | HR 77 | Temp 98.5°F | Ht 64.0 in | Wt 167.1 lb

## 2020-06-29 DIAGNOSIS — Z7689 Persons encountering health services in other specified circumstances: Secondary | ICD-10-CM

## 2020-06-29 DIAGNOSIS — Z1159 Encounter for screening for other viral diseases: Secondary | ICD-10-CM

## 2020-06-29 DIAGNOSIS — Z0000 Encounter for general adult medical examination without abnormal findings: Secondary | ICD-10-CM

## 2020-06-29 DIAGNOSIS — Z114 Encounter for screening for human immunodeficiency virus [HIV]: Secondary | ICD-10-CM

## 2020-06-29 NOTE — Patient Instructions (Signed)
Preventive Care 21-28 Years Old, Female Preventive care refers to lifestyle choices and visits with your health care provider that can promote health and wellness. This includes:  A yearly physical exam. This is also called an annual wellness visit.  Regular dental and eye exams.  Immunizations.  Screening for certain conditions.  Healthy lifestyle choices, such as: ? Eating a healthy diet. ? Getting regular exercise. ? Not using drugs or products that contain nicotine and tobacco. ? Limiting alcohol use. What can I expect for my preventive care visit? Physical exam Your health care provider may check your:  Height and weight. These may be used to calculate your BMI (body mass index). BMI is a measurement that tells if you are at a healthy weight.  Heart rate and blood pressure.  Body temperature.  Skin for abnormal spots. Counseling Your health care provider may ask you questions about your:  Past medical problems.  Family's medical history.  Alcohol, tobacco, and drug use.  Emotional well-being.  Home life and relationship well-being.  Sexual activity.  Diet, exercise, and sleep habits.  Work and work environment.  Access to firearms.  Method of birth control.  Menstrual cycle.  Pregnancy history. What immunizations do I need? Vaccines are usually given at various ages, according to a schedule. Your health care provider will recommend vaccines for you based on your age, medical history, and lifestyle or other factors, such as travel or where you work.   What tests do I need? Blood tests  Lipid and cholesterol levels. These may be checked every 5 years starting at age 20.  Hepatitis C test.  Hepatitis B test. Screening  Diabetes screening. This is done by checking your blood sugar (glucose) after you have not eaten for a while (fasting).  STD (sexually transmitted disease) testing, if you are at risk.  BRCA-related cancer screening. This may be  done if you have a family history of breast, ovarian, tubal, or peritoneal cancers.  Pelvic exam and Pap test. This may be done every 3 years starting at age 21. Starting at age 30, this may be done every 5 years if you have a Pap test in combination with an HPV test. Talk with your health care provider about your test results, treatment options, and if necessary, the need for more tests.   Follow these instructions at home: Eating and drinking  Eat a healthy diet that includes fresh fruits and vegetables, whole grains, lean protein, and low-fat dairy products.  Take vitamin and mineral supplements as recommended by your health care provider.  Do not drink alcohol if: ? Your health care provider tells you not to drink. ? You are pregnant, may be pregnant, or are planning to become pregnant.  If you drink alcohol: ? Limit how much you have to 0-1 drink a day. ? Be aware of how much alcohol is in your drink. In the [place] one drink equals one 12 oz bottle of beer (355 mL), one 5 oz glass of wine (148 mL), or one 1 oz glass of hard liquor (44 mL).   Lifestyle  Take daily care of your teeth and gums. Brush your teeth every morning and night with fluoride toothpaste. Floss one time each day.  Stay active. Exercise for at least 30 minutes 5 or more days each week.  Do not use any products that contain nicotine or tobacco, such as cigarettes, e-cigarettes, and chewing tobacco. If you need help quitting, ask your health care provider.  Do not   use drugs.  If you are sexually active, practice safe sex. Use a condom or other form of protection to prevent STIs (sexually transmitted infections).  If you do not wish to become pregnant, use a form of birth control. If you plan to become pregnant, see your health care provider for a prepregnancy visit.  Find healthy ways to cope with stress, such as: ? Meditation, yoga, or listening to music. ? Journaling. ? Talking to a trusted  person. ? Spending time with friends and family. Safety  Always wear your seat belt while driving or riding in a vehicle.  Do not drive: ? If you have been drinking alcohol. Do not ride with someone who has been drinking. ? When you are tired or distracted. ? While texting.  Wear a helmet and other protective equipment during sports activities.  If you have firearms in your house, make sure you follow all gun safety procedures.  Seek help if you have been physically or sexually abused. What's next?  Go to your health care provider once a year for an annual wellness visit.  Ask your health care provider how often you should have your eyes and teeth checked.  Stay up to date on all vaccines. This information is not intended to replace advice given to you by your health care provider. Make sure you discuss any questions you have with your health care provider. Document Revised: 10/11/2019 Document Reviewed: 10/24/2017 Elsevier Patient Education  2021 Elsevier Inc.  

## 2020-06-29 NOTE — Progress Notes (Signed)
New Patient Office Visit  Subjective:  Patient ID: Megan Powell, female    DOB: August 19, 1990  Age: 30 y.o. MRN: [id number]  CC:  Chief Complaint  Patient presents with  . Establish Care    HPI Megan Powell presents to establish care.  Dentist: upcoming appt, no concerns Eye exam: UTD, wears glasses Diet: no special diets Exercise: walks some, goes to the [facility] intermittently Pap smear: UTD  COVID: None, declined  Past Medical History:  Diagnosis Date  . History of gestational hypertension 12/06/2014   - baby ASA after 12 weeks - baseline labs  . Hypertension affecting pregnancy   . Pregnancy induced hypertension   . Rh negative status during pregnancy 04/24/2019  . Significant discrepancy between uterine size and clinical dates, antepartum 01/30/2019   Growth [place] ordered>29 wks 86%ile, AFI 11    Past Surgical History:  Procedure Laterality Date  . NO PAST SURGERIES    . TUBAL LIGATION N/A 04/25/2019   Procedure: POST PARTUM TUBAL LIGATION;  Surgeon: Woodroe Mode, MD;  Location: [facility] LD ORS;  Service: Gynecology;  Laterality: N/A;    Family History  Problem Relation Age of Onset  . Hypertension Father     Social History   Socioeconomic History  . Marital status: Married    Spouse name: Not on file  . Number of children: Not on file  . Years of education: Not on file  . Highest education level: Not on file  Occupational History  . Occupation: homemaker  Tobacco Use  . Smoking status: Never Smoker  . Smokeless tobacco: Never Used  Vaping Use  . Vaping Use: Never used  Substance and Sexual Activity  . Alcohol use: No    Alcohol/week: 0.0 standard drinks  . Drug use: Never  . Sexual activity: Yes    Partners: Male    Birth control/protection: Surgical    Comment: tubal ligation  Other Topics Concern  . Not on file  Social History Narrative  . Not on file   Social Determinants of Health   Financial Resource Strain: Not on file  Food Insecurity: Not on  file  Transportation Needs: Not on file  Physical Activity: Not on file  Stress: Not on file  Social Connections: Not on file  Intimate Partner Violence: Not on file    ROS Review of Systems  Constitutional: Negative for chills, fatigue, fever and unexpected weight change.  Eyes: Negative for visual disturbance.  Respiratory: Negative for cough, chest tightness, shortness of breath and wheezing.   Cardiovascular: Negative for chest pain, palpitations and leg swelling.  Gastrointestinal: Negative for abdominal pain, constipation, diarrhea, nausea and vomiting.  Endocrine: Negative for cold intolerance, heat intolerance, polydipsia, polyphagia and polyuria.  Genitourinary: Negative for dysuria, frequency, hematuria and urgency.  Neurological: Negative for dizziness, light-headedness and headaches.  Psychiatric/Behavioral: Negative for dysphoric mood, self-injury, sleep disturbance and suicidal ideas. The patient is not nervous/anxious.     Objective:   Today's Vitals: BP 104/68   Pulse 77   Temp 98.5 F (36.9 C)   Ht 5\' 4"  (1.626 m)   Wt 167 lb 1.6 oz (75.8 kg)   LMP 06/19/2020   SpO2 100%   BMI 28.68 kg/m   Physical Exam Constitutional:      General: She is not in acute distress.    Appearance: Normal appearance. She is not ill-appearing.  HENT:     Head: Normocephalic and atraumatic.     Right Ear: Tympanic membrane, ear canal and external ear  normal. There is no impacted cerumen.     Left Ear: Tympanic membrane, ear canal and external ear normal. There is no impacted cerumen.  Eyes:     Extraocular Movements: Extraocular movements intact.     Conjunctiva/sclera: Conjunctivae normal.     Pupils: Pupils are equal, round, and reactive to light.  Neck:     Thyroid: No thyromegaly.     Vascular: No carotid bruit or JVD.     Trachea: Trachea normal.  Cardiovascular:     Rate and Rhythm: Normal rate and regular rhythm.     Pulses: Normal pulses.     Heart sounds: Normal  heart sounds. No murmur heard. No friction rub. No gallop.   Pulmonary:     Effort: Pulmonary effort is normal. No respiratory distress.     Breath sounds: Normal breath sounds. No wheezing.  Chest:    Abdominal:     General: Bowel sounds are normal. There is no distension.     Palpations: Abdomen is soft.     Tenderness: There is no abdominal tenderness. There is no guarding.  Musculoskeletal:        General: Normal range of motion.     Cervical back: Normal range of motion and neck supple.  Skin:    General: Skin is warm and dry.  Neurological:     Mental Status: She is alert and oriented to person, place, and time.     Cranial Nerves: No cranial nerve deficit.  Psychiatric:        Mood and Affect: Mood normal.        Behavior: Behavior normal.        Thought Content: Thought content normal.        Judgment: Judgment normal.     Assessment & Plan:   1. Encounter to establish care Reviewed available information and discussed health concerns with patient.  2. Need for hepatitis C screening test Discuss screening recommendations.  Patient is agreeable so adding to blood work today. - Hepatitis C antibody  3. Annual physical exam Checking CBC with differential, CMP, and lipid panel today. - CBC with Differential/Platelet - COMPLETE METABOLIC PANEL WITH GFR - Lipid panel   Outpatient Encounter Medications as of 06/29/2020  Medication Sig  . [DISCONTINUED] acetaminophen (TYLENOL) 325 MG tablet Take 2 tablets (650 mg total) by mouth every 6 (six) hours as needed (for pain scale < 4). (Patient not taking: Reported on 06/05/2019)  . [DISCONTINUED] docusate sodium (COLACE) 100 MG capsule Take 100 mg by mouth daily as needed for mild constipation. (Patient not taking: Reported on 10/20/2019)  . [DISCONTINUED] ibuprofen (ADVIL) 600 MG tablet Take 1 tablet (600 mg total) by mouth every 6 (six) hours. (Patient not taking: Reported on 06/05/2019)  . [DISCONTINUED] omeprazole (PRILOSEC)  20 MG capsule Take 1 capsule (20 mg total) by mouth daily. (Patient not taking: Reported on 05/01/2019)  . [DISCONTINUED] oxyCODONE (ROXICODONE) 5 MG immediate release tablet Take 1 tablet (5 mg total) by mouth every 4 (four) hours as needed for severe pain. (Patient not taking: Reported on 05/01/2019)  . [DISCONTINUED] Prenat-Fe Poly-Methfol-FA-DHA (VITAFOL ULTRA) 29-0.6-0.4-200 MG CAPS Take 1 capsule by mouth daily. (Patient not taking: Reported on 06/05/2019)  . [DISCONTINUED] Probiotic CAPS Take 2 capsules by mouth at bedtime. (Patient not taking: Reported on 10/20/2019)  . [DISCONTINUED] senna-docusate (SENOKOT-S) 8.6-50 MG tablet Take 2 tablets by mouth daily. (Patient not taking: Reported on 05/01/2019)   No facility-administered encounter medications on file as of 06/29/2020.  Follow-up: Return in about 1 year (around 06/29/2021) for annual physical exam.   Clearnce Sorrel, DNP, APRN, FNP-BC [facility]

## 2020-06-30 LAB — CBC WITH DIFFERENTIAL/PLATELET
Absolute Monocytes: 353 cells/uL (ref 200–950)
Basophils Absolute: 23 cells/uL (ref 0–200)
Basophils Relative: 0.4 %
Eosinophils Absolute: 80 cells/uL (ref 15–500)
Eosinophils Relative: 1.4 %
HCT: 41.7 % (ref 35.0–45.0)
Hemoglobin: 14 g/dL (ref 11.7–15.5)
Lymphs Abs: 1875 cells/uL (ref 850–3900)
MCH: 30.1 pg (ref 27.0–33.0)
MCHC: 33.6 g/dL (ref 32.0–36.0)
MCV: 89.7 fL (ref 80.0–100.0)
MPV: 10.6 fL (ref 7.5–12.5)
Monocytes Relative: 6.2 %
Neutro Abs: 3369 cells/uL (ref 1500–7800)
Neutrophils Relative %: 59.1 %
Platelets: 297 10*3/uL (ref 140–400)
RBC: 4.65 10*6/uL (ref 3.80–5.10)
RDW: 12.5 % (ref 11.0–15.0)
Total Lymphocyte: 32.9 %
WBC: 5.7 10*3/uL (ref 3.8–10.8)

## 2020-06-30 LAB — COMPLETE METABOLIC PANEL WITH GFR
AG Ratio: 1.6 (calc) (ref 1.0–2.5)
ALT: 12 U/L (ref 6–29)
AST: 15 U/L (ref 10–30)
Albumin: 4.7 g/dL (ref 3.6–5.1)
Alkaline phosphatase (APISO): 37 U/L (ref 31–125)
BUN: 11 mg/dL (ref 7–25)
CO2: 26 mmol/L (ref 20–32)
Calcium: 9.8 mg/dL (ref 8.6–10.2)
Chloride: 104 mmol/L (ref 98–110)
Creat: 0.73 mg/dL (ref 0.50–1.10)
GFR, Est African American: 131 mL/min/{1.73_m2} (ref >=60)
GFR, Est Non African American: 113 mL/min/{1.73_m2} (ref >=60)
Globulin: 2.9 g/dL (calc) (ref 1.9–3.7)
Glucose, Bld: 82 mg/dL (ref 65–99)
Potassium: 4.3 mmol/L (ref 3.5–5.3)
Sodium: 140 mmol/L (ref 135–146)
Total Bilirubin: 0.8 mg/dL (ref 0.2–1.2)
Total Protein: 7.6 g/dL (ref 6.1–8.1)

## 2020-06-30 LAB — HEPATITIS C ANTIBODY
Hepatitis C Ab: NONREACTIVE
SIGNAL TO CUT-OFF: 0.01 (ref <1.00)

## 2020-06-30 LAB — LIPID PANEL
Cholesterol: 182 mg/dL (ref <200)
HDL: 60 mg/dL (ref >=50)
LDL Cholesterol (Calc): 108 mg/dL (calc) — ABNORMAL HIGH
Non-HDL Cholesterol (Calc): 122 mg/dL (calc) (ref <130)
Total CHOL/HDL Ratio: 3 (calc) (ref <5.0)
Triglycerides: 48 mg/dL (ref <150)

## 2020-07-20 ENCOUNTER — Encounter: Payer: Self-pay | Admitting: Medical-Surgical

## 2020-08-03 ENCOUNTER — Other Ambulatory Visit: Payer: Self-pay

## 2020-08-03 ENCOUNTER — Ambulatory Visit (INDEPENDENT_AMBULATORY_CARE_PROVIDER_SITE_OTHER): Payer: No Typology Code available for payment source | Admitting: Sports Medicine

## 2020-08-03 DIAGNOSIS — D171 Benign lipomatous neoplasm of skin and subcutaneous tissue of trunk: Secondary | ICD-10-CM

## 2020-08-03 HISTORY — DX: Benign lipomatous neoplasm of skin and subcutaneous tissue of trunk: D17.1

## 2020-08-03 MED ORDER — HYDROCODONE-ACETAMINOPHEN 5-325 MG PO TABS: 1.0000 | ORAL_TABLET | Freq: Three times a day (TID) | ORAL | 0 refills | Status: DC | PRN

## 2020-08-03 NOTE — Patient Instructions (Signed)
Incision Care, Adult An incision is a surgical cut that is made through your skin. Most incisions are closed after a surgical procedure. Your incision may be closed with stitches (sutures), staples, skin glue, or adhesive strips. You may need to return to your health care provider to have sutures or staples removed. This may occur several days or several weeks after your surgery. Until then, the incision needs to be cared for properly to prevent infection. Follow instructions from your health care provider about how to care for your incision. Supplies needed:  Soap, water, and a clean hand towel.  Wound cleanser.  A clean bandage (dressing), if needed.  Cream or ointment, if told by your health care provider.  Clean gauze. How to care for your incision Cleaning the incision Ask your health care provider how to clean the incision. This may include:  Using mild soap and water, or wound cleanser.  Using a clean gauze to pat the incision dry after cleaning it. Dressing changes  Wash your hands with soap and water for at least 20 seconds before and after you change the dressing. If soap and water are not available, use hand sanitizer.  Change your dressing as told by your health care provider.  Leave sutures, staples, skin glue, or adhesive strips in place. These skin closures may need to stay in place for 2 weeks or longer. If adhesive strip edges start to loosen and curl up, you may trim the loose edges. Do not remove adhesive strips completely unless your health care provider tells you to do that.  Apply cream or ointment. Do this only as told by your health care provider.  Cover the incision with a clean dressing. Ask your health care provider when you can begin leaving the incision uncovered. Checking for infection Check your incision area every day for signs of infection. Check for:  More redness, swelling, or pain.  More fluid or blood.  Warmth.  Pus or a bad smell.    Follow these instructions at home Medicines  Take over-the-counter and prescription medicines only as told by your health care provider.  If you were prescribed an antibiotic medicine, cream, or ointment, take or apply it as told by your health care provider. Do not stop using the antibiotic even if your condition improves. Eating and drinking  Eat a diet that includes protein, vitamin A, vitamin C, and other nutrient-rich foods to help the wound heal. ? Foods rich in protein include meat, fish, eggs, dairy, beans, and nuts. ? Foods rich in vitamin A include carrots and dark green, leafy vegetables. ? Foods rich in vitamin C include citrus fruits, tomatoes, broccoli, and peppers.  Drink enough fluid to keep your urine pale yellow. General instructions  Do not take baths, swim, use a hot tub, or do anything that would put the incision underwater until your health care provider approves. Ask your health care provider if you may take showers. You may only be allowed to take sponge baths.  Limit movement around your incision to promote healing. ? Avoid straining, lifting, or exercising for the first 2 weeks after your procedure, or for as long as told by your health care provider. ? Return to your normal activities as told by your health care provider. Ask your health care provider what activities are safe for you.  Do not scratch or pick at the incision. Keep it covered as told by your health care provider.  Protect your incision from the sun when you are   outside for the first 6 months, or for as long as told by your health care provider. Cover up the scar area or apply sunscreen that has an SPF of at least 30.  Do not use any products that contain nicotine or tobacco, such as cigarettes, e-cigarettes, and chewing tobacco. These can delay incision healing after surgery. If you need help quitting, ask your health care provider.  Keep all follow-up visits as told by your health care  provider. This is important.   Contact a health care provider if:  You have any of these signs of infection: ? More redness, swelling, or pain around your incision. ? More fluid or blood coming from your incision. ? Warmth coming from your incision. ? Pus or a bad smell coming from your incision. ? A fever.  You are nauseous or you vomit.  You are dizzy.  Your sutures, staples, skin glue, or adhesive strips come undone. Get help right away if:  You have a red streak on the skin near your incision.  Your incision bleeds through the dressing and the bleeding does not stop with gentle pressure.  The edges of your incision open up and separate.  You have signs of a serious bodily reaction to an infection. These signs may include: ? Fever, shaking chills, or feeling very cold. ? Confusion or anxiety. ? Severe pain. ? Trouble breathing. ? Fast heartbeat. ? Clammy or sweaty skin. ? A rash. These symptoms may represent a serious problem that is an emergency. Do not wait to see if the symptoms will go away. Get medical help right away. Call your local emergency services (911 in the [place] Do not drive yourself to the hospital. Summary  Follow instructions from your health care provider about how to care for your incision.  Wash your hands with soap and water for at least 20 seconds before and after you change the dressing. If soap and water are not available, use hand sanitizer.  Check your incision area every day for signs of infection.  Keep all follow-up visits as told by your health care provider. This is important. This information is not intended to replace advice given to you by your health care provider. Make sure you discuss any questions you have with your health care provider. Document Revised: 12/03/2018 Document Reviewed: 12/03/2018 Elsevier Patient Education  2021 Elsevier Inc.   

## 2020-08-03 NOTE — Assessment & Plan Note (Signed)
Right-sided chest wall lipoma. Surgical excision as above, return in 1 week for incision check, hydrocodone for postoperative pain.

## 2020-08-03 NOTE — Progress Notes (Signed)
    Procedures performed today:    Procedure:  Excision of 8 cm right upper abdominal/flank subcutaneous tumor Risks, benefits, and alternatives explained and consent obtained. Time out conducted. Surface prepped with alcohol. 20cc lidocaine with epinephine infiltrated in a field block into and around the tumor. Adequate anesthesia ensured. Area prepped and draped in a sterile fashion. Excision performed with: Using a #10 blade I made a linear excision, I used blunt dissection through the subcutaneous tissues until I exposed the tumor/lipoma capsule, I continue to use blunt dissection around the edges of the tumor and removed it en bloc, it appeared to be a classic benign encapsulated lipoma.  I then closed the incision with 3 simple interrupted deep dermals with 3-0 Vicryl, I then placed a running subcuticular 3-0 Vicryl to close the skin. Hemostasis achieved. Pt stable.  Independent interpretation of notes and tests performed by another provider:   None.  Brief History, Exam, Impression, and Recommendations:    Lipoma of anterior chest wall Right-sided chest wall lipoma. Surgical excision as above, return in 1 week for incision check, hydrocodone for postoperative pain.    ___________________________________________ Gwen Her. Dianah Field, M.D., ABFM., CAQSM. Primary Care and [place] [employer] of [employer] [facility]

## 2020-08-04 ENCOUNTER — Encounter: Payer: Self-pay | Admitting: Medical-Surgical

## 2020-08-04 ENCOUNTER — Ambulatory Visit (INDEPENDENT_AMBULATORY_CARE_PROVIDER_SITE_OTHER): Payer: No Typology Code available for payment source | Admitting: Medical-Surgical

## 2020-08-04 VITALS — BP 116/76 | HR 81 | Temp 98.0°F | Resp 20 | Ht 64.0 in | Wt 168.5 lb

## 2020-08-04 DIAGNOSIS — Z6828 Body mass index (BMI) 28.0-28.9, adult: Secondary | ICD-10-CM

## 2020-08-04 DIAGNOSIS — R635 Abnormal weight gain: Secondary | ICD-10-CM

## 2020-08-04 MED ORDER — PHENTERMINE HCL 37.5 MG PO CAPS: 37.5000 mg | ORAL_CAPSULE | ORAL | 0 refills | Status: DC

## 2020-08-04 NOTE — Patient Instructions (Signed)
Weight loss tips and tricks:  1.  Make sure to drink at least 64 ounces of water every day. 2.  Avoid alcohol. 3.  Avoid eating within 3 hours of going to bed. 4.  Cut out sugary drinks such as sweet tea, regular sodas, energy drinks, etc. 5.  Make sure you are getting enough sleep every night. 6.  Start making changes by cutting back portion sizes by 1/3. 7.  Keep a food diary to help identify areas for improvement and promote awareness of bad habits. 8.  Increase your activity.  Choose something you like to do that is fun for you so you are more likely to stick with it. 9.  Do not forget your protein! 10.  Measure your neck, upper arms, waist, hips, and thighs and write those measurements down somewhere before you start your weight loss journey.  Changes in your measurements will tell a far more accurate story than the number on a scale. 11.  As you go, pay attention to how your clothes fit! 12.  Weigh yourself as often or as little as you need to.  Some folks do better weighing every day while others do better with once a week. 13.  Do not get discouraged!  Weight loss efforts are meant to be lifestyle changes.  Once you stop a medication (if you are taking 1), your behaviors and habits will determine if you maintain your weight loss or not.  For those on medications:  1.  Take your medication as prescribed every day first thing in the morning. 2.  Common side effects include nausea and constipation.  To combat this, increased your daily water consumption.  Consider adding in a stool softener (available OTC) if needed.  Also increase fiber intake with vegetables and fruits. 3.  If you have any side effects or concerns while taking medication, please do not hesitate to reach out to us here at the office. 4.  While on weight loss medications, we do require you to follow-up every 4 weeks with an in office visit.  Refills will not be called in early on controlled substances.  Good luck on your  weight loss journey!  Have faith in your self and you will reach your goals!  

## 2020-08-04 NOTE — Progress Notes (Signed)
Subjective:    CC: discuss weight loss  HPI: Pleasant 30 year old female presenting to discuss weight gain.  Notes that she did a short period of phentermine last year and has about 20 pound weight loss while using the medication.  She has managed to keep that off and would like to do another period of phentermine to lose her remaining baby weight.  She has plans to modify her diet and has recently joined the [facility] so will be going to exercise on a regular basis.  No history of heart disease or hypertension to contraindicate use of phentermine.  I reviewed the past medical history, family history, social history, surgical history, and allergies today and no changes were needed.  Please see the problem list section below in epic for further details.  Past Medical History: Past Medical History:  Diagnosis Date   History of gestational hypertension 12/06/2014   - baby ASA after 12 weeks - baseline labs   Hypertension affecting pregnancy    Pregnancy induced hypertension    Rh negative status during pregnancy 04/24/2019   Significant discrepancy between uterine size and clinical dates, antepartum 01/30/2019   Growth [place] ordered>29 wks 86%ile, AFI 11   Past Surgical History: Past Surgical History:  Procedure Laterality Date   TUBAL LIGATION N/A 04/25/2019   Procedure: POST PARTUM TUBAL LIGATION;  Surgeon: Woodroe Mode, MD;  Location: [facility];  Service: Gynecology;  Laterality: N/A;   Social History: Social History   Socioeconomic History   Marital status: Married    Spouse name: Not on file   Number of children: Not on file   Years of education: Not on file   Highest education level: Not on file  Occupational History   Occupation: homemaker  Tobacco Use   Smoking status: Never   Smokeless tobacco: Never  Vaping Use   Vaping Use: Never used  Substance and Sexual Activity   Alcohol use: No    Alcohol/week: 0.0 standard drinks   Drug use: Never   Sexual activity: Yes     Partners: Male    Birth control/protection: Surgical    Comment: tubal ligation  Other Topics Concern   Not on file  Social History Narrative   Not on file   Social Determinants of Health   Financial Resource Strain: Not on file  Food Insecurity: Not on file  Transportation Needs: Not on file  Physical Activity: Not on file  Stress: Not on file  Social Connections: Not on file   Family History: Family History  Problem Relation Age of Onset   Hypertension Father    Allergies: No Known Allergies Medications: See med rec.  Review of Systems: See HPI for pertinent positives and negatives.   Objective:    General: Well Developed, well nourished, and in no acute distress.  Neuro: Alert and oriented x3.  HEENT: Normocephalic, atraumatic.  Skin: Warm and dry. Cardiac: Regular rate and rhythm, no murmurs rubs or gallops, no lower extremity edema.  Respiratory: Clear to auscultation bilaterally. Not using accessory muscles, speaking in full sentences.   Impression and Recommendations:    1. Body mass index (BMI) 28.0-28.9, adult 2. Weight gain Start phentermine 37.5 mg daily.  Recommend exercise at least 3 times weekly and dietary modifications to include portion control and low calorie.  Advised of potential side effects of phentermine.  Recommend increasing water and fiber intake to prevent constipation.  - phentermine 37.5 MG capsule; Take 1 capsule (37.5 mg total) by mouth every morning.  Dispense: 30 capsule; Refill: 0  Return in about 4 weeks (around 09/01/2020) for weight check. ___________________________________________ Clearnce Sorrel, DNP, APRN, FNP-BC Primary Care and [place]

## 2020-08-10 ENCOUNTER — Other Ambulatory Visit: Payer: Self-pay

## 2020-08-10 ENCOUNTER — Ambulatory Visit (INDEPENDENT_AMBULATORY_CARE_PROVIDER_SITE_OTHER): Payer: No Typology Code available for payment source | Admitting: Sports Medicine

## 2020-08-10 ENCOUNTER — Encounter: Payer: Self-pay | Admitting: Sports Medicine

## 2020-08-10 DIAGNOSIS — D171 Benign lipomatous neoplasm of skin and subcutaneous tissue of trunk: Secondary | ICD-10-CM

## 2020-08-10 NOTE — Assessment & Plan Note (Signed)
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 She has no pain, no bruising, I applied some Dermabond and she can return to see me as needed.

## 2020-08-10 NOTE — Progress Notes (Signed)
    Procedures performed today:    None.  Independent interpretation of notes and tests performed by another provider:   None.  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 She has no pain, no bruising, I applied some Dermabond and she can return to see me as needed.    ___________________________________________ Gwen Her. Dianah Field, M.D., ABFM., CAQSM. Primary Care and [place] Instructor of [place] of [facility] of Medicine

## 2020-08-23 ENCOUNTER — Ambulatory Visit: Payer: No Typology Code available for payment source | Admitting: Sports Medicine

## 2020-09-02 ENCOUNTER — Encounter: Payer: Self-pay | Admitting: Medical-Surgical

## 2020-09-02 ENCOUNTER — Other Ambulatory Visit: Payer: Self-pay

## 2020-09-02 ENCOUNTER — Ambulatory Visit (INDEPENDENT_AMBULATORY_CARE_PROVIDER_SITE_OTHER): Payer: No Typology Code available for payment source | Admitting: Medical-Surgical

## 2020-09-02 VITALS — BP 122/79 | HR 93 | Temp 98.7°F | Ht 64.0 in | Wt 162.4 lb

## 2020-09-02 DIAGNOSIS — Z6827 Body mass index (BMI) 27.0-27.9, adult: Secondary | ICD-10-CM

## 2020-09-02 DIAGNOSIS — Z7689 Persons encountering health services in other specified circumstances: Secondary | ICD-10-CM | POA: Diagnosis not present

## 2020-09-02 MED ORDER — PHENTERMINE HCL 37.5 MG PO CAPS: 37.5000 mg | ORAL_CAPSULE | ORAL | 0 refills | Status: DC

## 2020-09-02 NOTE — Progress Notes (Signed)
Subjective:    CC: weight check  HPI: Pleasant 30 year old female presenting for weight check on Phentermine 37.5mg  daily. She has been taking the medication as prescribed on weekdays but usually skips the dose on the weekends. Finds it easy to eat small portions of healthy meals during the week while at work but notes that weekends are hard as her husband wants to dine out and get fast food. She is doing some light physical activity but is not exercising like she would prefer. Has the gym membership in place but not going regularly. Reports a decreased appetite when she takes the medication. No fevers, chills, chest pain, palpitations, dizziness, mood changes, or constipation.   I reviewed the past medical history, family history, social history, surgical history, and allergies today and no changes were needed.  Please see the problem list section below in epic for further details.  Past Medical History: Past Medical History:  Diagnosis Date   History of gestational hypertension 12/06/2014   - baby ASA after 12 weeks - baseline labs   Hypertension affecting pregnancy    Pregnancy induced hypertension    Rh negative status during pregnancy 04/24/2019   Significant discrepancy between uterine size and clinical dates, antepartum 01/30/2019   Growth [place] ordered>29 wks 86%ile, AFI 11   Past Surgical History: Past Surgical History:  Procedure Laterality Date   TUBAL LIGATION N/A 04/25/2019   Procedure: POST PARTUM TUBAL LIGATION;  Surgeon: Woodroe Mode, MD;  Location: [facility];  Service: Gynecology;  Laterality: N/A;   Social History: Social History   Socioeconomic History   Marital status: Married    Spouse name: Not on file   Number of children: Not on file   Years of education: Not on file   Highest education level: Not on file  Occupational History   Occupation: homemaker  Tobacco Use   Smoking status: Never   Smokeless tobacco: Never  Vaping Use   Vaping Use: Never used   Substance and Sexual Activity   Alcohol use: No    Alcohol/week: 0.0 standard drinks   Drug use: Never   Sexual activity: Yes    Partners: Male    Birth control/protection: Surgical    Comment: tubal ligation  Other Topics Concern   Not on file  Social History Narrative   Not on file   Social Determinants of Health   Financial Resource Strain: Not on file  Food Insecurity: Not on file  Transportation Needs: Not on file  Physical Activity: Not on file  Stress: Not on file  Social Connections: Not on file   Family History: Family History  Problem Relation Age of Onset   Hypertension Father    Allergies: No Known Allergies Medications: See med rec.  Review of Systems: See HPI for pertinent positives and negatives.   Objective:    General: Well Developed, well nourished, and in no acute distress.  Neuro: Alert and oriented x3.  HEENT: Normocephalic, atraumatic.  Skin: Warm and dry. Well healed incision to the right ribs from lipoma removal with a small piece of suture material on one end that has Dermabond still attached. Trimmed the suture material close to the skin to reduce risk of it catching on clothing.  Cardiac: Regular rate and rhythm, no murmurs rubs or gallops, no lower extremity edema.  Respiratory: Clear to auscultation bilaterally. Not using accessory muscles, speaking in full sentences.  Impression and Recommendations:    1. Encounter for weight management 2. BMI 27.0-27.9,adult She has  lost about 6.5 pounds in the last months so is responding well. Continue phentermine 37.5mg  daily. Recommend using the medications on the weekends to help combat dining out and less healthy choices. Recommend increasing intentional exercise.  - phentermine 37.5 MG capsule; Take 1 capsule (37.5 mg total) by mouth every morning.  Dispense: 30 capsule; Refill: 0  Return in about 4 weeks (around 09/30/2020) for weight check. ___________________________________________ Clearnce Sorrel, DNP, APRN, FNP-BC Primary Care and [place]

## 2020-10-03 ENCOUNTER — Ambulatory Visit: Payer: No Typology Code available for payment source | Admitting: Medical-Surgical

## 2020-10-14 ENCOUNTER — Other Ambulatory Visit: Payer: Self-pay

## 2020-10-14 ENCOUNTER — Ambulatory Visit (INDEPENDENT_AMBULATORY_CARE_PROVIDER_SITE_OTHER): Payer: No Typology Code available for payment source | Admitting: Medical-Surgical

## 2020-10-14 ENCOUNTER — Encounter: Payer: Self-pay | Admitting: Medical-Surgical

## 2020-10-14 VITALS — BP 129/83 | HR 80 | Resp 20 | Ht 64.0 in | Wt 148.8 lb

## 2020-10-14 DIAGNOSIS — Z7689 Persons encountering health services in other specified circumstances: Secondary | ICD-10-CM | POA: Diagnosis not present

## 2020-10-14 NOTE — Progress Notes (Signed)
  HPI with pertinent ROS:   CC: weight check  HPI: Pleasant 30 year old female presenting today for weight check on Phentermine 37.'5mg'$  daily. Taking daily as prescribed and tolerating well. No side effects noted.  Over the past month, she has had quite a role events.  She had COVID and was out of work for approximately 10 days.  Her whole family was affected at the same time.  Immediately after, they went on a beach trip and were able to enjoy their vacation.  When he returned, there was a rush to get the kids back to school and get all the supplies they needed.  She has not been able to go to the gym although she does still have a membership.  She did start taking the phentermine on weekdays and weekends and feels that it has helped with appetite and reducing her intake.  Admits that she never thought she would see these numbers on the scale again and is very happy with her progress.  She does not have a number goal in mind but thinks that she would like to start weaning off the phentermine slowly.  I reviewed the past medical history, family history, social history, surgical history, and allergies today and no changes were needed.  Please see the problem list section below in epic for further details.   Physical exam:   General: Well Developed, well nourished, and in no acute distress.  Neuro: Alert and oriented x3.  HEENT: Normocephalic, atraumatic.  Skin: Warm and dry. Cardiac: Regular rate and rhythm, no murmurs rubs or gallops, no lower extremity edema.  Respiratory: Clear to auscultation bilaterally. Not using accessory muscles, speaking in full sentences.  Impression and Recommendations:    1. Encounter for weight management She has done fabulous with nearly 14 pounds of weight loss in the last 5 weeks.  Some of this was related to her illness, but otherwise she has done very well.  Recommend continuing dietary and lifestyle modifications.  Work on getting to [employer] and doing regular  exercise.  New prescription for phentermine 37.5 mg sent to the pharmacy with instructions to wean down to every other day dosing for a few weeks then twice weekly dosing for couple of weeks then stop.  She will follow-up with me if she feels like she is having difficulty with weaning off or maintaining her current weight.  Return for Annual physical exam per insurance requirements next year or sooner if needed. ___________________________________________ Clearnce Sorrel, DNP, APRN, FNP-BC Primary Care and Sports Medicine [place]

## 2020-10-22 ENCOUNTER — Other Ambulatory Visit: Payer: Self-pay | Admitting: Medical-Surgical

## 2020-10-22 DIAGNOSIS — Z6827 Body mass index (BMI) 27.0-27.9, adult: Secondary | ICD-10-CM

## 2020-10-25 ENCOUNTER — Other Ambulatory Visit: Payer: Self-pay | Admitting: Medical-Surgical

## 2020-10-25 DIAGNOSIS — Z6827 Body mass index (BMI) 27.0-27.9, adult: Secondary | ICD-10-CM

## 2020-10-27 ENCOUNTER — Other Ambulatory Visit: Payer: Self-pay | Admitting: Medical-Surgical

## 2020-10-27 ENCOUNTER — Encounter: Payer: Self-pay | Admitting: Medical-Surgical

## 2020-10-27 DIAGNOSIS — Z6827 Body mass index (BMI) 27.0-27.9, adult: Secondary | ICD-10-CM

## 2020-10-27 MED ORDER — PHENTERMINE HCL 15 MG PO CAPS: ORAL_CAPSULE | ORAL | 0 refills | Status: DC

## 2020-11-30 ENCOUNTER — Encounter: Payer: Self-pay | Admitting: Medical-Surgical

## 2020-12-28 ENCOUNTER — Ambulatory Visit: Payer: No Typology Code available for payment source | Admitting: Medical-Surgical

## 2020-12-28 ENCOUNTER — Other Ambulatory Visit: Payer: Self-pay

## 2020-12-28 ENCOUNTER — Ambulatory Visit (INDEPENDENT_AMBULATORY_CARE_PROVIDER_SITE_OTHER): Payer: No Typology Code available for payment source | Admitting: Sports Medicine

## 2020-12-28 DIAGNOSIS — L918 Other hypertrophic disorders of the skin: Secondary | ICD-10-CM | POA: Diagnosis not present

## 2020-12-28 DIAGNOSIS — R635 Abnormal weight gain: Secondary | ICD-10-CM

## 2020-12-28 MED ORDER — PHENTERMINE HCL 37.5 MG PO CAPS: 37.5000 mg | ORAL_CAPSULE | ORAL | 0 refills | Status: DC

## 2020-12-28 NOTE — Assessment & Plan Note (Signed)
Good results initially with phentermine, restarting phentermine, follow-up with PCP for weight check, we will do capsules this time.

## 2020-12-28 NOTE — Progress Notes (Signed)
    Procedures performed today:    Procedure:  Cryodestruction of midline cervical acrochordon Consent obtained and verified. Time-out conducted. Noted no overlying erythema, induration, or other signs of local infection. Completed without difficulty using Cryo-Gun. Advised to call if fevers/chills, erythema, induration, drainage, or persistent bleeding.  Independent interpretation of notes and tests performed by another provider:   None.  Brief History, Exam, Impression, and Recommendations:    Acrochordon Cryotherapy of a midline neck acrochordon. Return to see me if not gone in 2 to 4 weeks  Abnormal weight gain Good results initially with phentermine, restarting phentermine, follow-up with PCP for weight check, we will do capsules this time.  Chronic process not at goal with pharmacologic intervention   ___________________________________________ Gwen Her. Dianah Field, M.D., ABFM., CAQSM. Primary Care and [place] Instructor of [place] of [facility] of Medicine

## 2020-12-28 NOTE — Assessment & Plan Note (Signed)
Cryotherapy of a midline neck acrochordon. Return to see me if not gone in 2 to 4 weeks

## 2021-01-25 ENCOUNTER — Ambulatory Visit (INDEPENDENT_AMBULATORY_CARE_PROVIDER_SITE_OTHER): Payer: No Typology Code available for payment source | Admitting: Medical-Surgical

## 2021-01-25 ENCOUNTER — Other Ambulatory Visit: Payer: Self-pay

## 2021-01-25 ENCOUNTER — Encounter: Payer: Self-pay | Admitting: Medical-Surgical

## 2021-01-25 VITALS — BP 119/74 | HR 85 | Resp 20 | Ht 64.0 in | Wt 140.0 lb

## 2021-01-25 DIAGNOSIS — R635 Abnormal weight gain: Secondary | ICD-10-CM | POA: Diagnosis not present

## 2021-01-25 DIAGNOSIS — L918 Other hypertrophic disorders of the skin: Secondary | ICD-10-CM | POA: Diagnosis not present

## 2021-01-25 MED ORDER — PHENTERMINE HCL 37.5 MG PO CAPS: 37.5000 mg | ORAL_CAPSULE | ORAL | 0 refills | Status: DC

## 2021-01-25 NOTE — Progress Notes (Signed)
  HPI with pertinent ROS:   CC: Skin tag   HPI: Pleasant 30 year old female presenting today to have a skin tag/mole removed.  She has had this frozen before with cryotherapy by Dr. Darene Lamer and noted that it did reduce in size however it has not gone away like the one on her abdomen.  Notes that her insurance will expire today as she is gone as needed at work and she is waiting to be added onto her husband's insurance.  She would like to go ahead and get this lesion retreated in hopes that it will go away completely this time.  Weight check-she is currently using phentermine 37.5 mg daily, tolerating well without side effects.  Is very happy with her result so far and notes that her goal is to be in the 130s.  Would like to complete another month of phentermine and then feels that she will be able to stop.  She has staying active and eating a heart healthy lower calorie diet.  I reviewed the past medical history, family history, social history, surgical history, and allergies today and no changes were needed.  Please see the problem list section below in epic for further details.   Physical exam:   General: Well Developed, well nourished, and in no acute distress.  Neuro: Alert and oriented x3.  HEENT: Normocephalic, atraumatic.  Skin: Warm and dry. Cardiac: Regular rate and rhythm.  Respiratory:  Not using accessory muscles, speaking in full sentences.  Impression and Recommendations:    1. Skin tag Cryotherapy template Procedure: Cryodestruction of: Posterior neck skin tag Consent obtained and verified. Time-out conducted. Noted no overlying erythema, induration, or other signs of local infection. Completed without difficulty using Cryo-Gun. Advised to call if fevers/chills, erythema, induration, drainage, or persistent bleeding.   2. Abnormal weight gain Continue phentermine 37.5 mg for 1 more month then discontinue.  Continue dietary and lifestyle modifications.   Return if  symptoms worsen or fail to improve. ___________________________________________ Clearnce Sorrel, DNP, APRN, FNP-BC Primary Care and [place]

## 2021-03-28 ENCOUNTER — Encounter: Payer: Self-pay | Admitting: Medical-Surgical

## 2021-04-12 ENCOUNTER — Telehealth: Payer: Self-pay | Admitting: *Deleted

## 2021-04-12 NOTE — Telephone Encounter (Signed)
No answer/no voicemail to leave a message about canceled appointment that needs to be rescheduled for an earlier date due to established patient not being a New GYN.

## 2021-04-27 ENCOUNTER — Encounter: Payer: Self-pay | Admitting: Medical-Surgical

## 2021-04-27 ENCOUNTER — Telehealth (INDEPENDENT_AMBULATORY_CARE_PROVIDER_SITE_OTHER): Payer: No Typology Code available for payment source | Admitting: Medical-Surgical

## 2021-04-27 DIAGNOSIS — L709 Acne, unspecified: Secondary | ICD-10-CM | POA: Diagnosis not present

## 2021-04-27 MED ORDER — TRETINOIN 0.025 % EX CREA: TOPICAL_CREAM | Freq: Every day | CUTANEOUS | 5 refills | Status: DC

## 2021-04-27 NOTE — Progress Notes (Signed)
Virtual Visit via Video Note ? ?I connected with Megan Powell on 04/27/21 at  8:10 AM EST by a video enabled telemedicine application and verified that I am speaking with the correct person using two identifiers. ?  ?I discussed the limitations of evaluation and management by telemedicine and the availability of in person appointments. The patient expressed understanding and agreed to proceed. ? ?Patient location: home ?Provider locations: office ? ?Subjective:   ? ?CC: Discuss facial medicines ? ?HPI: ?Pleasant 31 year old female presenting via [place] video visit to discuss continued issues with facial acne.  She has been using multiple products to try and manage this but has done a lot of research and would like to try tretinoin cream.  Notes that using facial gels dries her skin out too much and she would like to give the cream ago.  She was using over-the-counter tretinoin but ran out a couple of weeks ago.  Mostly experiences issues with acne on an intermittent basis and affects the lower part of her face.  She does have some issues with dark spots developing that which she would like to get a handle on. ? ?Past medical history, Surgical history, Family history not pertinant except as noted below, Social history, Allergies, and medications have been entered into the medical record, reviewed, and corrections made.  ? ?Review of Systems: See HPI for pertinent positives and negatives.  ? ?Objective:   ? ?General: Speaking clearly in complete sentences without any shortness of breath.  Alert and oriented x3.  Normal judgment. No apparent acute distress. ? ?Impression and Recommendations:   ? ?1. Adult acne ?Reviewed recommendations for facial care and management of acne.  She is doing well overall taking care of her skin however her acne is persistent and very cosmetically disturbing.  Sending in tretinoin 0.025% cream to use at bedtime.  Advised that she may have redness, irritation, and skin peeling over the  first 2 weeks but this usually resolves on its own.  If this does not work well for her, we can look into increasing the strength to 0.05%.  She will let me know. ?- tretinoin (RETIN-A) 0.025 % cream; Apply topically at bedtime.  Dispense: 45 g; Refill: 5 ? ?I discussed the assessment and treatment plan with the patient. The patient was provided an opportunity to ask questions and all were answered. The patient agreed with the plan and demonstrated an understanding of the instructions. ?  ?The patient was advised to call back or seek an in-person evaluation if the symptoms worsen or if the condition fails to improve as anticipated. ? ?20 minutes of non-face-to-face time was provided during this encounter. ? ?Return in about 6 weeks (around 06/08/2021) for Acne follow-up. ? ?Clearnce Sorrel, DNP, APRN, FNP-BC ?[place] ?Primary Care and Sports Medicine ?

## 2021-05-03 ENCOUNTER — Telehealth: Payer: Self-pay

## 2021-05-03 NOTE — Telephone Encounter (Signed)
Initiated Prior authorization for: Tretinoin 0.025% cream ?Via: Covermymeds ?Case/Key:[id number] - [place] Case ID: [id number] ?Status: approved  as of 05/03/21 ?Reason:Coverage Starts on: 05/03/2021 12:00:00 AM, Coverage Ends on: 05/03/2022 ?Notified Pt via: Mychart ?

## 2021-05-11 ENCOUNTER — Telehealth: Payer: Self-pay | Admitting: *Deleted

## 2021-05-11 NOTE — Telephone Encounter (Signed)
No voicemail to leave a message. Phone rings several times and goes to a busy signal. ?

## 2021-05-22 ENCOUNTER — Telehealth: Payer: Self-pay | Admitting: *Deleted

## 2021-05-22 NOTE — Telephone Encounter (Signed)
The office has not been able to reach patient by phone or MyChart for a month. Left spouse of patient an urgent message for patient to give the office a call to reschedule her appointment that was scheduled on 05/25/2021. Appointment was canceled due to patient not being a New GYN. ?

## 2021-05-22 NOTE — Telephone Encounter (Signed)
No answer/no voicemail to leave a message about canceled appointment that needs to be rescheduled due to established patient not being a New GYN. ?

## 2021-05-25 ENCOUNTER — Encounter: Payer: No Typology Code available for payment source | Admitting: Obstetrics and Gynecology

## 2021-06-13 ENCOUNTER — Ambulatory Visit (INDEPENDENT_AMBULATORY_CARE_PROVIDER_SITE_OTHER): Payer: No Typology Code available for payment source | Admitting: Advanced Practice Midwife

## 2021-06-13 ENCOUNTER — Encounter: Payer: Self-pay | Admitting: Advanced Practice Midwife

## 2021-06-13 VITALS — BP 123/78 | HR 66 | Resp 16 | Ht 64.0 in | Wt 148.0 lb

## 2021-06-13 DIAGNOSIS — Z01419 Encounter for gynecological examination (general) (routine) without abnormal findings: Secondary | ICD-10-CM | POA: Diagnosis not present

## 2021-06-13 DIAGNOSIS — N938 Other specified abnormal uterine and vaginal bleeding: Secondary | ICD-10-CM | POA: Insufficient documentation

## 2021-06-13 DIAGNOSIS — N939 Abnormal uterine and vaginal bleeding, unspecified: Secondary | ICD-10-CM | POA: Diagnosis not present

## 2021-06-13 NOTE — Progress Notes (Signed)
? ?  Subjective:  ?  ? Megan Powell is a 31 y.o. female here at [facility] for a routine exam.  Current complaints: heavier periods with in between spotting since BTL in 2021.  Personal health questionnaire reviewed: yes. ? ?Do you have a primary care provider? yes ?Do you feel safe at home? yes ? ?[place] Office Visit from 01/25/2021 in [place]  ?PHQ-2 Total Score 0  ? ?  ? ? ?There are no preventive care reminders to display for this patient.  ? ?Risk factors for chronic health problems: ?Smoking: ?Alchohol/how much: ?Pt BMI: There is no height or weight on file to calculate BMI. ?  ?Gynecologic History ?No LMP recorded. ?Contraception: tubal ligation ?Last Pap: 10/20/2019. Results were: normal ?Last mammogram: n/a. ? ?Obstetric History ?OB History  ?Gravida Para Term Preterm AB Living  ?'4 4 4     4  '$ ?SAB IAB Ectopic Multiple Live Births  ?      0 4  ?  ?# Outcome Date GA Lbr Len/2nd Weight Sex Delivery Anes PTL Lv  ?4 Term 04/24/19 70w6d06:02 / 00:05 9 lb 0.3 oz (4.09 kg) M Vag-Spont EPI  LIV  ?3 Term 03/29/17 367w5d1:56 / 00:06 8 lb 7.3 oz (3.836 kg) F Vag-Spont EPI  LIV  ?2 Term 07/22/15 [id number]:11 8 lb 11.9 oz (3.966 kg) M Vag-Spont EPI  LIV  ?1 Term 11/19/13 39w25w2d56 / 00:59 8 lb (3.629 kg) M Vag-Spont EPI  LIV  ?   Complications: Gestational hypertension  ? ? ? ?The following portions of the patient's history were reviewed and updated as appropriate: allergies, current medications, past family history, past medical history, past social history, past surgical history, and problem list. ? ?Review of Systems ?Pertinent items noted in HPI and remainder of comprehensive ROS otherwise negative.  ?  ?Objective:  ? ?There were no vitals taken for this visit. ?VS reviewed, nursing note reviewed,  ?Constitutional: well developed, well nourished, no distress ?HEENT: normocephalic ?HEART: normal rate, heart sounds, regular rhythm ?RESP: normal effort, lung  sounds clear and equal bilaterally  ?Breast Exam:  Deferred with low risks and shared decision making, discussed recommendation to start mammogram between 40-50 yo/ Abdomen: soft ?Neuro: alert and oriented x 3 ?Skin: warm, dry ?Psych: affect normal ?Pelvic exam: Bimanual exam: Cervix 0/long/high, firm, anterior, neg CMT, uterus nontender, nonenlarged, adnexa without tenderness, enlargement, or mass  ? ? ?   ?Assessment/Plan:  ? ?1. Well woman exam with routine gynecological exam ?--Overall doing well, Pap due in 2024, no family hx breast or other female cancers. ? ?2. Abnormal uterine bleeding (AUB) ?--She has had some success with diet and exercise making menses lighter and less cramping since January.   ?--Has BTL so does not need contraception ?--Plan to continue diet/exercise/vitamins ?--Discussed tx for heavy menses including OCPs or IUD ?--Pt to consider IUD, will call office if she decides to schedule  ? ? ? ?No follow-ups on file.  ? ?LisaFatima BlankM ?8:37 AM   ?

## 2021-07-26 ENCOUNTER — Encounter: Payer: Self-pay | Admitting: Medical-Surgical

## 2021-07-26 DIAGNOSIS — L709 Acne, unspecified: Secondary | ICD-10-CM

## 2021-07-26 MED ORDER — TRETINOIN 0.05 % EX CREA: TOPICAL_CREAM | Freq: Every day | CUTANEOUS | 0 refills | Status: DC

## 2021-11-23 ENCOUNTER — Encounter: Payer: Self-pay | Admitting: Medical-Surgical

## 2021-12-05 ENCOUNTER — Encounter: Payer: Self-pay | Admitting: Medical-Surgical

## 2021-12-05 ENCOUNTER — Ambulatory Visit (INDEPENDENT_AMBULATORY_CARE_PROVIDER_SITE_OTHER): Payer: No Typology Code available for payment source | Admitting: Medical-Surgical

## 2021-12-05 VITALS — BP 108/68 | HR 74 | Resp 20 | Ht 64.0 in | Wt 167.1 lb

## 2021-12-05 DIAGNOSIS — E663 Overweight: Secondary | ICD-10-CM | POA: Diagnosis not present

## 2021-12-05 DIAGNOSIS — Z0000 Encounter for general adult medical examination without abnormal findings: Secondary | ICD-10-CM | POA: Diagnosis not present

## 2021-12-05 MED ORDER — TRETINOIN 0.05 % EX CREA: TOPICAL_CREAM | Freq: Every day | CUTANEOUS | 0 refills | Status: AC

## 2021-12-05 MED ORDER — PHENTERMINE HCL 37.5 MG PO CAPS: ORAL_CAPSULE | ORAL | 0 refills | Status: DC

## 2021-12-05 NOTE — Progress Notes (Signed)
Complete physical exam  Patient: Megan Powell   DOB: [birth year]   31 y.o. Female  MRN: [id number]  Subjective:    Chief Complaint  Patient presents with   Annual Exam    Megan Powell is a 31 y.o. female who presents today for a complete physical exam. She reports consuming a general diet.  Plans to go to the gym with her husband.  She generally feels well. She reports sleeping well. She does have additional problems to discuss today.   Continues to be worried about weight.  She lost down to approximately 140 pounds and felt that that was a really good way for her.  Unfortunately since stopping phentermine, she has gotten a little less adherent to her exercise and dietary modifications.  She is interested in restarting phentermine for short period to help boost her weight loss as she gets back into the gym with her husband and starts working on their diet.  She did well with the medication before and would like to go ahead and start at the 37.5 mg dose since the 15 mg dose was unhelpful.  Most recent fall risk assessment:    12/05/2021    1:34 PM  [place] in the past year? 0  Number falls in past yr: 0  Injury with Fall? 0  Risk for fall due to : No Fall Risks  Follow up Falls evaluation completed     Most recent depression screenings:    12/05/2021    1:34 PM 01/25/2021    8:37 AM  PHQ 2/9 Scores  PHQ - 2 Score 0 0    Vision:Within last year, Dental: No current dental problems and Receives regular dental care, and STD: The patient denies history of sexually transmitted disease.    Patient Care Team: Samuel Bouche, NP as PCP - General (Nurse Practitioner)   Outpatient Medications Prior to Visit  Medication Sig   [DISCONTINUED] tretinoin (RETIN-A) 0.05 % cream Apply topically at bedtime.   No facility-administered medications prior to visit.   Review of Systems  Constitutional:  Negative for chills, fever, malaise/fatigue and weight loss.  HENT:  Negative  for congestion, ear pain, hearing loss, sinus pain and sore throat.   Eyes:  Negative for blurred vision, photophobia and pain.  Respiratory:  Negative for cough, shortness of breath and wheezing.   Cardiovascular:  Negative for chest pain, palpitations and leg swelling.  Gastrointestinal:  Negative for abdominal pain, constipation, diarrhea, heartburn, nausea and vomiting.  Genitourinary:  Negative for dysuria, frequency and urgency.  Musculoskeletal:  Negative for falls and neck pain.  Skin:  Negative for itching and rash.  Neurological:  Negative for dizziness, weakness and headaches.  Endo/Heme/Allergies:  Negative for polydipsia. Does not bruise/bleed easily.  Psychiatric/Behavioral:  Negative for depression, substance abuse and suicidal ideas. The patient has insomnia (related to husband's difficulty sleeping). The patient is not nervous/anxious.      Objective:    BP 108/68   Pulse 74   Resp 20   Ht '5\' 4"'$  (1.626 m)   Wt 167 lb 1.6 oz (75.8 kg)   SpO2 100%   BMI 28.68 kg/m    Physical Exam Constitutional:      General: She is not in acute distress.    Appearance: Normal appearance. She is not ill-appearing.  HENT:     Head: Normocephalic and atraumatic.     Right Ear: Tympanic membrane, ear canal and external ear normal. There is no impacted cerumen.  Left Ear: Tympanic membrane, ear canal and external ear normal. There is no impacted cerumen.     Nose: Nose normal. No rhinorrhea.     Mouth/Throat:     Mouth: Mucous membranes are moist.     Pharynx: No oropharyngeal exudate or posterior oropharyngeal erythema.  Eyes:     General: No scleral icterus.       Right eye: No discharge.        Left eye: No discharge.     Extraocular Movements: Extraocular movements intact.     Conjunctiva/sclera: Conjunctivae normal.     Pupils: Pupils are equal, round, and reactive to light.  Neck:     Thyroid: No thyromegaly.     Vascular: No carotid bruit or JVD.     Trachea:  Trachea normal.  Cardiovascular:     Rate and Rhythm: Normal rate and regular rhythm.     Pulses: Normal pulses.     Heart sounds: Normal heart sounds. No murmur heard.    No friction rub. No gallop.  Pulmonary:     Effort: Pulmonary effort is normal. No respiratory distress.     Breath sounds: Normal breath sounds. No wheezing.  Abdominal:     General: Bowel sounds are normal. There is no distension.     Palpations: Abdomen is soft.     Tenderness: There is no abdominal tenderness. There is no guarding.  Musculoskeletal:        General: Normal range of motion.     Cervical back: Normal range of motion and neck supple.  Skin:    General: Skin is warm and dry.  Neurological:     Mental Status: She is alert and oriented to person, place, and time.     Cranial Nerves: No cranial nerve deficit.  Psychiatric:        Mood and Affect: Mood normal.        Behavior: Behavior normal.        Thought Content: Thought content normal.        Judgment: Judgment normal.    No results found for any visits on 12/05/21.     Assessment & Plan:    Routine Health Maintenance and Physical Exam  Immunization History  Administered Date(s) Administered   Influenza,inj,Quad PF,6+ Mos 12/05/2018   Influenza-Unspecified 11/15/2016   Tdap 04/29/2015, 01/30/2019    Health Maintenance  Topic Date Due   PAP-Cervical Cytology Screening  10/20/2022   PAP SMEAR-Modifier  10/20/2022   TETANUS/TDAP  01/29/2029   Hepatitis C Screening  Completed   HIV Screening  Completed   HPV VACCINES  Aged Out   INFLUENZA VACCINE  Discontinued   COVID-19 Vaccine  Discontinued    Discussed health benefits of physical activity, and encouraged her to engage in regular exercise appropriate for her age and condition.  1. Annual physical exam Checking labs as below.  Appointment in place to get dental and eye care updated.  Wellness information provided with AVS. - CBC with Differential/Platelet - COMPLETE METABOLIC  PANEL WITH GFR - Lipid panel  2. Overweight Discussed use of phentermine as a short-term resolution to weight concerns.  We will go ahead and start phentermine 37 mg daily for up to 3 months but she will need to make dietary and lifestyle modifications to maintain her weight loss.  Patient verbalized understanding and will follow-up in 1 month.   Return in about 4 weeks (around 01/02/2022) for weight check.     Samuel Bouche, NP

## 2021-12-06 LAB — CBC WITH DIFFERENTIAL/PLATELET
Absolute Monocytes: 459 cells/uL (ref 200–950)
Basophils Absolute: 50 cells/uL (ref 0–200)
Basophils Relative: 0.8 %
Eosinophils Absolute: 130 cells/uL (ref 15–500)
Eosinophils Relative: 2.1 %
HCT: 39.7 % (ref 35.0–45.0)
Hemoglobin: 13.3 g/dL (ref 11.7–15.5)
Lymphs Abs: 2127 cells/uL (ref 850–3900)
MCH: 29.6 pg (ref 27.0–33.0)
MCHC: 33.5 g/dL (ref 32.0–36.0)
MCV: 88.2 fL (ref 80.0–100.0)
MPV: 11.1 fL (ref 7.5–12.5)
Monocytes Relative: 7.4 %
Neutro Abs: 3435 cells/uL (ref 1500–7800)
Neutrophils Relative %: 55.4 %
Platelets: 265 10*3/uL (ref 140–400)
RBC: 4.5 10*6/uL (ref 3.80–5.10)
RDW: 13.8 % (ref 11.0–15.0)
Total Lymphocyte: 34.3 %
WBC: 6.2 10*3/uL (ref 3.8–10.8)

## 2021-12-06 LAB — COMPLETE METABOLIC PANEL WITH GFR
AG Ratio: 1.6 (calc) (ref 1.0–2.5)
ALT: 10 U/L (ref 6–29)
AST: 14 U/L (ref 10–30)
Albumin: 4.5 g/dL (ref 3.6–5.1)
Alkaline phosphatase (APISO): 31 U/L (ref 31–125)
BUN: 12 mg/dL (ref 7–25)
CO2: 23 mmol/L (ref 20–32)
Calcium: 9.3 mg/dL (ref 8.6–10.2)
Chloride: 106 mmol/L (ref 98–110)
Creat: 0.76 mg/dL (ref 0.50–0.96)
Globulin: 2.9 g/dL (calc) (ref 1.9–3.7)
Glucose, Bld: 82 mg/dL (ref 65–99)
Potassium: 4.2 mmol/L (ref 3.5–5.3)
Sodium: 139 mmol/L (ref 135–146)
Total Bilirubin: 0.6 mg/dL (ref 0.2–1.2)
Total Protein: 7.4 g/dL (ref 6.1–8.1)
eGFR: 109 mL/min/{1.73_m2} (ref >=60)

## 2021-12-06 LAB — LIPID PANEL
Cholesterol: 192 mg/dL (ref <200)
HDL: 63 mg/dL (ref >=50)
LDL Cholesterol (Calc): 114 mg/dL (calc) — ABNORMAL HIGH
Non-HDL Cholesterol (Calc): 129 mg/dL (calc) (ref <130)
Total CHOL/HDL Ratio: 3 (calc) (ref <5.0)
Triglycerides: 64 mg/dL (ref <150)

## 2022-01-02 ENCOUNTER — Encounter: Payer: Self-pay | Admitting: Medical-Surgical

## 2022-01-02 ENCOUNTER — Ambulatory Visit (INDEPENDENT_AMBULATORY_CARE_PROVIDER_SITE_OTHER): Payer: No Typology Code available for payment source | Admitting: Medical-Surgical

## 2022-01-02 VITALS — BP 121/75 | HR 81 | Ht 64.0 in | Wt 163.0 lb

## 2022-01-02 DIAGNOSIS — Z7689 Persons encountering health services in other specified circumstances: Secondary | ICD-10-CM | POA: Diagnosis not present

## 2022-01-02 DIAGNOSIS — R0981 Nasal congestion: Secondary | ICD-10-CM | POA: Diagnosis not present

## 2022-01-02 MED ORDER — PHENTERMINE HCL 37.5 MG PO CAPS: ORAL_CAPSULE | ORAL | 0 refills | Status: DC

## 2022-01-02 MED ORDER — IPRATROPIUM BROMIDE 0.03 % NA SOLN: 2.0000 | Freq: Two times a day (BID) | NASAL | 0 refills | Status: DC

## 2022-01-02 NOTE — Progress Notes (Signed)
Established Patient Office Visit  Subjective   Patient ID: Megan Powell, female   DOB: 1990/09/11 Age: 31 y.o. MRN: [id number]   Chief Complaint  Patient presents with   Weight Check    HPI Pleasant 31 year old female presenting today for weight check on phentermine.  She has been taking phentermine 37.5 mg daily, tolerating well.  Notes that she has noted an increase in her thirst as well as a decrease in her appetite.  Has been drinking more fluids to combat the "cotton ball" feeling in her mouth.  Has increased her exercise and is doing strength training 3 to 5 days weekly.  Unfortunately, she ended up with a viral illness last week which knocked her out of her routine.  She is working to get back into this.  She is eating small portions and working to make healthier dietary choices.  Notes that the phentermine is not working as well as it did previously as she was warned.  Denies chest pain, shortness of breath, palpitations, constipation, headaches, and dizziness.  Does still have some lingering congestion from her recent viral illness.  Tends to avoid nasal sprays so has not tried any of those.  Wonders if it is okay to take Sudafed along with phentermine.   Objective:    Vitals:   01/02/22 0811  BP: 121/75  Pulse: 81  Height: '5\' 4"'$  (1.626 m)  Weight: 163 lb (73.9 kg)  SpO2: 100%  BMI (Calculated): 27.97    Physical Exam Vitals and nursing note reviewed.  Constitutional:      General: She is not in acute distress.    Appearance: Normal appearance. She is not ill-appearing.  HENT:     Head: Normocephalic and atraumatic.  Cardiovascular:     Rate and Rhythm: Normal rate and regular rhythm.     Pulses: Normal pulses.     Heart sounds: Normal heart sounds.  Pulmonary:     Effort: Pulmonary effort is normal. No respiratory distress.     Breath sounds: Normal breath sounds. No wheezing, rhonchi or rales.  Skin:    General: Skin is warm and dry.  Neurological:     Mental  Status: She is alert and oriented to person, place, and time.  Psychiatric:        Mood and Affect: Mood normal.        Behavior: Behavior normal.        Thought Content: Thought content normal.        Judgment: Judgment normal.   No results found for this or any previous visit (from the past 24 hour(s)).     The ASCVD Risk score (Arnett DK, et al., 2019) failed to calculate for the following reasons:   The 2019 ASCVD risk score is only valid for ages 17 to 37   Assessment & Plan:   1. Encounter for weight management Month 1 of phentermine completed.  4 pound weight loss over the last 4 weeks despite recent illness and reduced activity.  Continue phentermine 37.5 mg daily.  Continue regular intentional exercise and dietary modifications.  2. Sinus congestion Recommend avoiding Sudafed in conjunction with phentermine.  Okay to hold phentermine and take Sudafed if desired for sinus congestion.  Consider Flonase to reduce sinus inflammation.  Sending in Atrovent to see if this is helpful.  Can also try Mucinex to help thin secretions and resolve congestion faster.  Return in about 4 weeks (around 01/30/2022) for weight check.  ___________________________________________ Clearnce Sorrel, DNP, APRN,  FNP-BC [facility]

## 2022-01-26 ENCOUNTER — Other Ambulatory Visit: Payer: Self-pay | Admitting: Medical-Surgical

## 2022-02-01 ENCOUNTER — Ambulatory Visit: Payer: No Typology Code available for payment source | Admitting: Medical-Surgical

## 2022-02-10 ENCOUNTER — Encounter: Payer: Self-pay | Admitting: Medical-Surgical

## 2022-02-11 ENCOUNTER — Other Ambulatory Visit: Payer: Self-pay | Admitting: Medical-Surgical

## 2022-09-10 ENCOUNTER — Encounter: Payer: Self-pay | Admitting: Medical-Surgical

## 2022-09-11 NOTE — Telephone Encounter (Signed)
Patient scheduled.

## 2022-09-20 ENCOUNTER — Ambulatory Visit: Payer: 59 | Admitting: Medical-Surgical

## 2022-09-28 ENCOUNTER — Ambulatory Visit (INDEPENDENT_AMBULATORY_CARE_PROVIDER_SITE_OTHER): Payer: BC Managed Care – PPO | Admitting: Medical-Surgical

## 2022-09-28 ENCOUNTER — Encounter: Payer: Self-pay | Admitting: Medical-Surgical

## 2022-09-28 VITALS — BP 132/87 | HR 84 | Ht 64.0 in | Wt 168.7 lb

## 2022-09-28 DIAGNOSIS — R41840 Attention and concentration deficit: Secondary | ICD-10-CM

## 2022-09-28 DIAGNOSIS — R61 Generalized hyperhidrosis: Secondary | ICD-10-CM

## 2022-09-28 DIAGNOSIS — R110 Nausea: Secondary | ICD-10-CM

## 2022-09-28 DIAGNOSIS — H93292 Other abnormal auditory perceptions, left ear: Secondary | ICD-10-CM

## 2022-09-28 MED ORDER — ONDANSETRON 4 MG PO TBDP: 4.0000 mg | ORAL_TABLET | Freq: Three times a day (TID) | ORAL | 3 refills | Status: DC | PRN

## 2022-09-28 NOTE — Progress Notes (Signed)
        Established patient visit  History, exam, impression, and plan:  1. Nausea 2. Night sweats Pleasant 32 year old female presenting today with reports of having nausea just prior to her cycle that lasts approximately 5 days.  This started about 3 months ago and occurred 2 months in a row.  This past month, she did not have any further nausea.  Notes that it happens at the time of her regular PMS symptoms and is not affected by her dietary intake or activity.  Also notes that she has been having night sweats over the past several months that occur for approximately 2 weeks out of the month and can be severe.  Unclear etiology.  Nausea could certainly be part of PMS symptoms however this is unusual to come out of the blue without any changes.  Concern for possible hormonal fluctuations given the presence of night sweats throughout the month.  Plan to check labs as below to evaluate for hormonal etiology. - Estradiol - Progesterone - Testosterone - Prolactin - TSH  3. Inattention Recently has noticed that she has significant difficulty focusing.  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  Several coworkers have mentioned this and think that she may have ADHD.  Has never been tested but is interested in doing so.  We discussed recommendations for evaluation.  Due to the end of nature and time away from work needed for a referral to psychology, she would like to try an online option for evaluation.  Discussed expectations and potential treatments should she be diagnosed.  She will let me know if this is something she would like to pursue.  4. Abnormal auditory perception of left ear Over the last 2 weeks And then has had a weird fluttering sensation in her left ear accompanied by intermittent muffling of hearing.  No ear pain, drainage, popping, clicking.  No significant sinus congestion,  sore throat, postnasal drip, fever, chills, or swollen lymph nodes.  Exam benign with normal TM and canal noted.  Advised to continue monitoring this.  Suspect this may be something related to eustachian tube dysfunction and suggested possible benefit with Flonase.  She is not nasal spray firmly and would prefer to avoid this for now.  She will continue to monitor and if new symptoms arise, she will reach out.   Procedures performed this visit: None.  Return if symptoms worsen or fail to improve.  __________________________________ Thayer Ohm, DNP, APRN, FNP-BC [facility]

## 2022-10-10 ENCOUNTER — Encounter: Payer: Self-pay | Admitting: Medical-Surgical

## 2022-11-26 ENCOUNTER — Encounter: Payer: Self-pay | Admitting: Obstetrics & Gynecology

## 2022-11-26 ENCOUNTER — Other Ambulatory Visit (HOSPITAL_COMMUNITY)
Admission: RE | Admit: 2022-11-26 | Discharge: 2022-11-26 | Disposition: A | Discharge status: Home / Self Care | Payer: BC Managed Care – PPO | Source: Ambulatory Visit | Attending: Obstetrics & Gynecology | Admitting: Obstetrics & Gynecology

## 2022-11-26 ENCOUNTER — Ambulatory Visit: Payer: BC Managed Care – PPO | Admitting: Obstetrics & Gynecology

## 2022-11-26 VITALS — BP 132/77 | HR 108 | Resp 16 | Ht 64.0 in | Wt 177.0 lb

## 2022-11-26 DIAGNOSIS — N921 Excessive and frequent menstruation with irregular cycle: Secondary | ICD-10-CM

## 2022-11-26 DIAGNOSIS — Z01419 Encounter for gynecological examination (general) (routine) without abnormal findings: Secondary | ICD-10-CM

## 2022-11-26 DIAGNOSIS — N946 Dysmenorrhea, unspecified: Secondary | ICD-10-CM | POA: Diagnosis not present

## 2022-11-26 NOTE — Progress Notes (Signed)
Subjective:     Megan Powell is a 32 y.o. female here for a routine exam.  Current complaints: heavy, painful periods--spot for a week, then heavy 3 days then stops.  Cramps occur during the heavy days.    Gynecologic History Patient's last menstrual period was 11/18/2022. Contraception: tubal ligation Last Pap: 2021. Results were: normal Last mammogram: n/a.   Obstetric History OB History  Gravida Para Term Preterm AB Living  4 4 4     4   SAB IAB Ectopic Multiple Live Births        0 4    # Outcome Date GA Lbr Len/2nd Weight Sex Type Anes PTL Lv  4 Term 04/24/19 [redacted]w[redacted]d 06:02 / 00:05 9 lb 0.3 oz (4.09 kg) M Vag-Spont EPI  LIV  3 Term 03/29/17 [redacted]w[redacted]d 01:56 / 00:06 8 lb 7.3 oz (3.836 kg) F Vag-Spont EPI  LIV  2 Term 07/22/15 [redacted]w[redacted]d / 00:11 8 lb 11.9 oz (3.966 kg) M Vag-Spont EPI  LIV  1 Term 11/19/13 [redacted]w[redacted]d 07:56 / 00:59 8 lb (3.629 kg) M Vag-Spont EPI  LIV     Complications: Gestational hypertension     The following portions of the patient's history were reviewed and updated as appropriate: allergies, current medications, past family history, past medical history, past social history, past surgical history, and problem list.  Review of Systems Pertinent items noted in HPI and remainder of comprehensive ROS otherwise negative.    Objective:     Vitals:   11/26/22 1541  BP: 132/77  Pulse: (!) 108  Resp: 16  Weight: 177 lb (80.3 kg)  Height: 5\' 4"  (1.626 m)   Vitals:  WNL General appearance: alert, cooperative and no distress  HEENT: Normocephalic, without obvious abnormality, atraumatic Eyes: negative Throat: lips, mucosa, and tongue normal; teeth and gums normal  Respiratory: Clear to auscultation bilaterally  CV: Regular rate and rhythm  Breasts:  Normal appearance, no masses or tenderness, no nipple retraction or dimpling  GI: Soft, non-tender; bowel sounds normal; no masses,  no organomegaly  GU: External Genitalia:  Tanner V, no lesion Urethra:  No prolapse    Vagina: Pink, normal rugae, no blood or discharge  Cervix: No CMT, no lesion  Uterus:  Normal size and contour, non tender  Adnexa: Normal, no masses, non tender  Musculoskeletal: No edema, redness or tenderness in the calves or thighs  Skin: No lesions or rash  Lymphatic: Axillary adenopathy: none     Psychiatric: Normal mood and behavior        Assessment:    Healthy female exam.    Plan:    Pap with co testing Dymenorrhea and menomet--check cbc for anemia; pelvic US complete for menomet.   IUD handout given (does not want systemic hormones); interested in hysterectomy--after [place], labs, and reading about IUD, if pt still wants a surgical consult will refer to Dr. Para March or [place].

## 2022-11-27 LAB — CBC
Hematocrit: 40.8 % (ref 34.0–46.6)
Hemoglobin: 13.4 g/dL (ref 11.1–15.9)
MCH: 30.5 pg (ref 26.6–33.0)
MCHC: 32.8 g/dL (ref 31.5–35.7)
MCV: 93 fL (ref 79–97)
Platelets: 292 10*3/uL (ref 150–450)
RBC: 4.4 x10E6/uL (ref 3.77–5.28)
RDW: 13.4 % (ref 11.7–15.4)
WBC: 7.5 10*3/uL (ref 3.4–10.8)

## 2022-11-29 LAB — CYTOLOGY - PAP
Comment: NEGATIVE
Diagnosis: NEGATIVE
High risk HPV: NEGATIVE

## 2022-12-14 ENCOUNTER — Ambulatory Visit (INDEPENDENT_AMBULATORY_CARE_PROVIDER_SITE_OTHER): Payer: BC Managed Care – PPO

## 2022-12-14 DIAGNOSIS — N83202 Unspecified ovarian cyst, left side: Secondary | ICD-10-CM | POA: Diagnosis not present

## 2022-12-14 DIAGNOSIS — N921 Excessive and frequent menstruation with irregular cycle: Secondary | ICD-10-CM | POA: Diagnosis not present

## 2022-12-14 DIAGNOSIS — N946 Dysmenorrhea, unspecified: Secondary | ICD-10-CM | POA: Diagnosis not present

## 2022-12-18 ENCOUNTER — Other Ambulatory Visit: Payer: Self-pay

## 2022-12-18 DIAGNOSIS — N83209 Unspecified ovarian cyst, unspecified side: Secondary | ICD-10-CM

## 2023-01-18 ENCOUNTER — Encounter: Payer: Self-pay | Admitting: Medical-Surgical

## 2023-01-23 ENCOUNTER — Encounter: Payer: Self-pay | Admitting: Physician Assistant

## 2023-01-23 ENCOUNTER — Ambulatory Visit (INDEPENDENT_AMBULATORY_CARE_PROVIDER_SITE_OTHER): Payer: BC Managed Care – PPO | Admitting: Physician Assistant

## 2023-01-23 ENCOUNTER — Ambulatory Visit: Payer: BC Managed Care – PPO

## 2023-01-23 VITALS — BP 117/74 | HR 93 | Temp 98.0°F | Resp 20 | Ht 64.0 in | Wt 174.4 lb

## 2023-01-23 DIAGNOSIS — L84 Corns and callosities: Secondary | ICD-10-CM | POA: Insufficient documentation

## 2023-01-23 DIAGNOSIS — M25572 Pain in left ankle and joints of left foot: Secondary | ICD-10-CM | POA: Diagnosis not present

## 2023-01-23 DIAGNOSIS — M7732 Calcaneal spur, left foot: Secondary | ICD-10-CM | POA: Diagnosis not present

## 2023-01-23 DIAGNOSIS — M79672 Pain in left foot: Secondary | ICD-10-CM | POA: Diagnosis not present

## 2023-01-23 DIAGNOSIS — N83209 Unspecified ovarian cyst, unspecified side: Secondary | ICD-10-CM

## 2023-01-23 DIAGNOSIS — N83292 Other ovarian cyst, left side: Secondary | ICD-10-CM | POA: Diagnosis not present

## 2023-01-23 DIAGNOSIS — N83202 Unspecified ovarian cyst, left side: Secondary | ICD-10-CM | POA: Diagnosis not present

## 2023-01-23 MED ORDER — DICLOFENAC SODIUM 1 % EX GEL
4.0000 g | Freq: Four times a day (QID) | CUTANEOUS | 1 refills | Status: DC
Start: 2023-01-23 — End: 2023-03-20

## 2023-01-23 NOTE — Patient Instructions (Signed)
Use diclofenac gel 4 times a day as needed Wear good supportive shoes with metatarasal pad Get xray today

## 2023-01-23 NOTE — Progress Notes (Signed)
Acute Office Visit  Subjective:     Patient ID: Samiyyah Reefer, female    DOB: February 08, 1991, 32 y.o.   MRN: [id number]  Chief Complaint  Patient presents with   Foot Injury    LEFT    HPI Patient is in today for left lateral plantar foot pad pain starting 1 wk ago. She noticed it when she stood on her outer foot. She denies injury. It is tender to touch. She is on her feet a lot at work. She has never had this problem before. It can be really painful when she bears weight the right way on it. Not tried anything to make better.   .. Active Ambulatory Problems    Diagnosis Date Noted   Abnormal weight gain 12/28/2020   Acrochordon 12/28/2020   Abnormal uterine bleeding (AUB) 06/13/2021   Callus of foot 01/23/2023   Left foot pain 01/23/2023   Resolved Ambulatory Problems    Diagnosis Date Noted   Gestational hypertension, third trimester 11/18/2013   Spontaneous vaginal delivery 11/21/2013   History of gestational hypertension 12/06/2014   Supervision of other normal pregnancy, antepartum 12/06/2014   Rh negative status during pregnancy 12/06/2014   Biological false positive RPR test 05/10/2015   Active labor 07/22/2015   Short interval between pregnancies affecting pregnancy in first trimester, antepartum 08/14/2016   Gestational hypertension without significant proteinuria 03/29/2017   Vaginal delivery 03/29/2017   Supervision of other normal pregnancy, antepartum 09/10/2018   Significant discrepancy between uterine size and clinical dates, antepartum 01/30/2019   Excessive weight gain affecting pregnancy 02/25/2019   Indication for care in labor or delivery 04/24/2019   Rh negative status during pregnancy 04/24/2019   SVD (spontaneous vaginal delivery) 04/24/2019   Lipoma of anterior chest wall 08/03/2020   Past Medical History:  Diagnosis Date   Hypertension affecting pregnancy    Pregnancy induced hypertension      Review of Systems  Musculoskeletal:         Foot pain      Objective:    BP 117/74   Pulse 93   Temp 98 F (36.7 C)   Resp 20   Ht 5\' 4"  (1.626 m)   Wt 174 lb 6.4 oz (79.1 kg)   SpO2 100%   BMI 29.94 kg/m    Physical Exam Musculoskeletal:     Left foot: Normal range of motion. No deformity, bunion, Charcot foot, foot drop or prominent metatarsal heads.       Feet:  Feet:     Left foot:     Skin integrity: Callus present. No ulcer, blister, skin breakdown, erythema, warmth, dry skin or fissure.     Comments: TTP and callus over head of 5th metatarsal (blue circle)       Assessment & Plan:  Marland KitchenMarland KitchenLachell was seen today for foot injury.  Diagnoses and all orders for this visit:  Left foot pain -     DG Foot Complete Left; Future -     diclofenac Sodium (VOLTAREN) 1 % GEL; Apply 4 g topically 4 (four) times daily. To affected joint.  Callus of foot    Ordered left foot xray to rule out bony pathology.  Consider CT scan if no abnormalities found on xray if fail conservative therapy.  Provided patient with metatarsal inserts. Recommended corn pads with the inserts if the inserts are not sufficient. Consider post-op shoe if insert + corn pads does not help. Voltaren gel 4x/day provided for pain and inflammation. Recommended epsom  salt soaks and pummel stone for callus.   Patient declined flu vaccine today.   Tandy Gaw, PA-C

## 2023-01-23 NOTE — Progress Notes (Signed)
Heel spur but no bony abnormality. Treatment plan stays the same for the next week then let me know if you are still having significant pain and we can get you a boot or consider more imaging.

## 2023-01-28 ENCOUNTER — Encounter: Payer: Self-pay | Admitting: Obstetrics & Gynecology

## 2023-01-28 ENCOUNTER — Telehealth: Payer: Self-pay | Admitting: *Deleted

## 2023-01-28 NOTE — Telephone Encounter (Signed)
Left patient a message to call and schedule surgery consult with Dr. Para March or Berton Lan per Dr. Penne Lash.

## 2023-02-13 ENCOUNTER — Ambulatory Visit (INDEPENDENT_AMBULATORY_CARE_PROVIDER_SITE_OTHER): Payer: BC Managed Care – PPO | Admitting: Obstetrics and Gynecology

## 2023-02-13 ENCOUNTER — Encounter: Payer: Self-pay | Admitting: Obstetrics and Gynecology

## 2023-02-13 VITALS — BP 128/84 | HR 86 | Ht 64.0 in | Wt 175.0 lb

## 2023-02-13 DIAGNOSIS — N939 Abnormal uterine and vaginal bleeding, unspecified: Secondary | ICD-10-CM | POA: Diagnosis not present

## 2023-02-13 MED ORDER — TRANEXAMIC ACID 650 MG PO TABS: 1300.0000 mg | ORAL_TABLET | Freq: Three times a day (TID) | ORAL | 2 refills | Status: DC

## 2023-02-13 NOTE — Progress Notes (Signed)
   RETURN GYNECOLOGY VISIT  Subjective:  Megan Powell is a 32 y.o. [id number] s/p PP BTL with LMP 02/06/23 presenting for discussion of hysterectomy  Presents with worsening menstrual cycles characterized by heavy bleeding, severe cramping, and significant pelvic pain. The patient reports that these symptoms have progressively worsened since the tubal ligation (2021). Has premenstrual spotting for up to 7 days followed by a 3-4 day period. Spotting particularly bothersome. On heavy days, passes large clots & changes an ultra-sized tampon every two hours. Occasional dyspareunia, which seems to vary throughout the menstrual cycle. Also notes sharp, stabbing pain on the left side during ovulation.   Tried ibuprofen and a heating pad with limited success.  The patient expresses a strong desire to avoid hormonal treatments and has expressed interest in a hysterectomy. The patient's mother and grandmother both had histories of fibroids and endometriosis, respectively, and both underwent hysterectomies.   SVDx4 PP BTL, no other surgeries No current medications  I personally reviewed the following: - CBC 11/26/22 Hgb 13.4 - Pap 11/26/22 NILM/HPV neg - TSH 11/26/22 1.56 - PRL 09/28/22 10.4 - Total T 09/28/22 36 - Pelvic US 01/23/23 10 x 7 x 5cm uterus with 1.6cm EL with debris/clot, no masses, 3.5cm stable cyst with internal echos  Objective:   Vitals:   02/13/23 1427 02/13/23 1513  BP: (!) 151/88 128/84  Pulse: 94 86  Weight: 175 lb (79.4 kg)   Height: 5\' 4"  (1.626 m)    General:  Alert, oriented and cooperative. Patient is in no acute distress.  Skin: Skin is warm and dry. No rash noted.   Cardiovascular: Normal heart rate noted  Respiratory: Normal respiratory effort, no problems with respiration noted  Abdomen: Soft, non-tender, non-distended   Pelvic: NEFG. No levator/obturator tenderness. Uterus small, mobile, non tender. +descensus. No adnexal masses or tenderness.   Exam performed in the  presence of a chaperone  Assessment and Plan:  Megan Powell is a 32 y.o. with AUB  1. Abnormal uterine bleeding (AUB) (Primary) Suspect endometrial polyp contributing to patient's bothersome bleeding. Potential role for adeno/endo/dysmenorrhea. Exam non focal today. Pelvic US normal but with El31mm and nonhomogeno We reviewed management options including Lysteda (TXA), scheduled NSAIDs, hysteroscopic polypectomy, endometrial ablation, and hysterectomy. Declines all hormonal options.  Opted for sonohysterogram to assess for polyp and lysteda to manage heavy bleeding - [place] Sonohysterogram; Future -     tranexamic acid (LYSTEDA) 650 MG TABS tablet; Take 2 tablets (1,300 mg total) by mouth 3 (three) times daily. Take during menses for a maximum of five days  Return for follow up after sonohystogram .  Lennart Pall, MD

## 2023-02-13 NOTE — Patient Instructions (Signed)
Plan: - Sonohysterogram to look for a polyp causing your spotting - Lysteda 3 times a day on the heaviest days to reduce your bleeding - Ibuprofen 600mg  every 6 hours as needed for pain

## 2023-03-20 ENCOUNTER — Ambulatory Visit (HOSPITAL_BASED_OUTPATIENT_CLINIC_OR_DEPARTMENT_OTHER): Payer: BC Managed Care – PPO | Admitting: Obstetrics & Gynecology

## 2023-03-20 ENCOUNTER — Ambulatory Visit (HOSPITAL_BASED_OUTPATIENT_CLINIC_OR_DEPARTMENT_OTHER): Payer: BC Managed Care – PPO

## 2023-03-20 ENCOUNTER — Encounter (HOSPITAL_BASED_OUTPATIENT_CLINIC_OR_DEPARTMENT_OTHER): Payer: Self-pay | Admitting: Obstetrics & Gynecology

## 2023-03-20 VITALS — BP 145/87 | HR 73 | Ht 64.25 in | Wt 181.2 lb

## 2023-03-20 DIAGNOSIS — N939 Abnormal uterine and vaginal bleeding, unspecified: Secondary | ICD-10-CM

## 2023-03-20 DIAGNOSIS — N946 Dysmenorrhea, unspecified: Secondary | ICD-10-CM

## 2023-03-20 DIAGNOSIS — N921 Excessive and frequent menstruation with irregular cycle: Secondary | ICD-10-CM | POA: Diagnosis not present

## 2023-03-23 NOTE — Progress Notes (Signed)
GYNECOLOGY  VISIT  CC:   Sonohysterogram  HPI: 33 y.o. [id number] Married White or Caucasian female here for [facility] in referral from Dr. Berton Lan.  She has hx of menorrhagia and dysmenorrhea.  Feels heaviness and pain when on menstural cycles.  Has been having spotting prior to cycles for up to 7 days which is then followed by a heavy, cramping cycle lasting about 4 days.  Flow can be heavy with significant clots.  Has been given Lysteda.  Does not want to be on hormonal therapy.  Has four children and h/o tubal ligation.  Scheduled for sonohysterogram to r/o endometrial pathology.  This was negative today.  Result sent to Dr. Berton Lan.  Reports she really just wants definitive surgery.      Past Medical History:  Diagnosis Date   History of gestational hypertension 12/06/2014   - baby ASA after 12 weeks - baseline labs   Hypertension affecting pregnancy    Lipoma of anterior chest wall 08/03/2020   Pregnancy induced hypertension    Rh negative status during pregnancy 04/24/2019   Significant discrepancy between uterine size and clinical dates, antepartum 01/30/2019   Growth [place] ordered>29 wks 86%ile, AFI 11    MEDS:   Current Outpatient Medications on File Prior to Visit  Medication Sig Dispense Refill   tranexamic acid (LYSTEDA) 650 MG TABS tablet Take 2 tablets (1,300 mg total) by mouth 3 (three) times daily. Take during menses for a maximum of five days 30 tablet 2   tretinoin (RETIN-A) 0.05 % cream Apply topically at bedtime. 45 g 0   No current facility-administered medications on file prior to visit.    ALLERGIES: Patient has no known allergies.  SH:  married, non smoker  Review of Systems  Constitutional: Negative.   Genitourinary: Negative.     PHYSICAL EXAMINATION:    BP (!) 145/87 (BP Location: Right Arm, Patient Position: Sitting, Cuff Size: Large)   Pulse 73   Ht 5' 4.25" (1.632 m)   Wt 181 lb 3.2 oz (82.2 kg)   LMP 03/06/2023 (Approximate)   BMI 30.86 kg/m       Physical Exam Constitutional:      Appearance: Normal appearance.  Genitourinary:    Labia:        Right: No lesion.        Left: No lesion.      Vagina: Normal.     Cervix: Normal.     Uterus: Normal.   Lymphadenopathy:     Lower Body: No right inguinal adenopathy. No left inguinal adenopathy.  Neurological:     General: No focal deficit present.     Mental Status: She is alert.  Psychiatric:        Mood and Affect: Mood normal.     Alycia Rossetti, Chaperone present for exam.  Assessment/Plan: 1. Abnormal uterine bleeding (AUB) (Primary) - sonohysterogram was normal today - has not had endometrial biopsy which may be needed for surgical procedure - estradiol normal 09/2022 - Pap normal 11/26/2022.  2. Menorrhagia with irregular cycle - last hb was 12/05/2021  3. Dysmenorrhea

## 2023-03-25 ENCOUNTER — Encounter: Payer: Self-pay | Admitting: Obstetrics and Gynecology

## 2023-05-15 ENCOUNTER — Encounter: Payer: Self-pay | Admitting: Medical-Surgical

## 2023-05-23 ENCOUNTER — Encounter: Payer: Self-pay | Admitting: Medical-Surgical

## 2023-05-23 ENCOUNTER — Ambulatory Visit (INDEPENDENT_AMBULATORY_CARE_PROVIDER_SITE_OTHER): Admitting: Medical-Surgical

## 2023-05-23 VITALS — BP 131/80 | HR 70 | Resp 20 | Ht 64.25 in | Wt 191.1 lb

## 2023-05-23 DIAGNOSIS — F5081 Binge eating disorder, mild: Secondary | ICD-10-CM

## 2023-05-23 MED ORDER — LISDEXAMFETAMINE DIMESYLATE 30 MG PO CAPS
30.0000 mg | ORAL_CAPSULE | Freq: Every day | ORAL | 0 refills | Status: DC
Start: 2023-05-23 — End: 2023-06-17

## 2023-05-23 NOTE — Progress Notes (Unsigned)
   Established patient visit  History, exam, impression, and plan:  1. Cough, unspecified type 2. Sore throat Megan Powell 33 year old female presenting today with reports of upper respiratory symptoms.  Notes that this started approximately 5 days ago with her nose and eyes burning.  On Saturday, she felt unwell and by Sunday she notes that she felt awful.  Her throat has been sore and she has been coughing.  Notes that her cough has been occasionally productive of small amounts of pink-tinged sputum, usually in the morning.  Her left ear has now developed pressure/discomfort and she has significant sinus congestion.  She has tried drinking hot tea and increasing her fluid consumption.  She has also used Chloraseptic for the sore throat.  Continues to have significant issues with hoarseness.  She saw ENT and they recommended just using Atrovent nasal spray twice daily and follow-up with them after a couple of months.  POCT strep, flu, and COVID testing all negative today.  Below for physical exam. - POCT rapid strep A - POCT Influenza A/B - POC COVID-19  3. Viral URI with cough Despite negative testing, suspect that her symptoms are truly related to a viral URI.  Discussed the timeline for resolution of a viral illness since symptoms can last 7 to 14 days.  With her severe hoarseness and significant sinus congestion, treating with Decadron 4 mg twice daily.  Adding Tussionex for cough suppression.  Okay to use Tylenol/ibuprofen for fever/discomfort.  Continue conservative measures at home.  If improvement in symptoms not noted over the next 2 to 3 days or symptoms improved but quickly worsen again, consider adding antibiotic therapy for secondary bacterial infection.   Procedures performed this visit: None.  Return if symptoms worsen or fail to improve.  __________________________________ Thayer Ohm, DNP, APRN, FNP-BC Primary Care and Sports Medicine [facility] [place]

## 2023-06-03 ENCOUNTER — Ambulatory Visit: Admitting: Medical-Surgical

## 2023-06-17 ENCOUNTER — Encounter: Payer: Self-pay | Admitting: Medical-Surgical

## 2023-06-17 ENCOUNTER — Ambulatory Visit (INDEPENDENT_AMBULATORY_CARE_PROVIDER_SITE_OTHER): Admitting: Medical-Surgical

## 2023-06-17 VITALS — BP 127/82 | HR 78 | Resp 20 | Ht 64.25 in | Wt 187.1 lb

## 2023-06-17 DIAGNOSIS — F5081 Binge eating disorder, mild: Secondary | ICD-10-CM | POA: Diagnosis not present

## 2023-06-17 MED ORDER — LISDEXAMFETAMINE DIMESYLATE 40 MG PO CAPS: 40.0000 mg | ORAL_CAPSULE | ORAL | 0 refills | Status: DC

## 2023-06-17 NOTE — Progress Notes (Signed)
        Established patient visit  History, exam, impression, and plan:  1. Mild binge-eating disorder (Primary) Very pleasant 33 year old female presenting today for follow-up on binge eating disorder.  Approximately 4 weeks ago, we started Vyvanse  30 mg daily.  She has been taking as prescribed, tolerating well without significant side effects.  Feels that the medication could work a little better but has noted a slight decrease in appetite and snacking as well as some cognitive benefits with increased focus, reduced episodes of feeling overwhelmed, and task completion.  No change in sleep pattern.  Denies palpitations, chest pain, and worsened anxiety.  Cardiopulmonary exam is normal today.  Blood pressure is stable.  Notes that she will be losing her insurance soon as her current contract for work is about to expire.  Plan to increase Vyvanse  to 40 mg daily and follow-up in 4 weeks via MyChart message to evaluate tolerance and response.  Office visit follow-up in 3 months.  Procedures performed this visit: None.  Return in about 3 months (around 09/16/2023) for ADHD follow up.  __________________________________ Maryl Snook, DNP, APRN, FNP-BC Primary Care and Sports Medicine [facility] [place]

## 2023-06-19 ENCOUNTER — Ambulatory Visit: Admitting: Medical-Surgical

## 2023-07-09 ENCOUNTER — Encounter: Payer: Self-pay | Admitting: Medical-Surgical

## 2023-07-16 ENCOUNTER — Encounter: Payer: Self-pay | Admitting: Obstetrics and Gynecology

## 2023-07-18 ENCOUNTER — Telehealth: Admitting: Obstetrics and Gynecology

## 2023-09-13 ENCOUNTER — Ambulatory Visit: Admitting: Medical-Surgical

## 2023-10-02 ENCOUNTER — Other Ambulatory Visit (HOSPITAL_COMMUNITY)
Admission: RE | Admit: 2023-10-02 | Discharge: 2023-10-02 | Disposition: A | Discharge status: Home / Self Care | Source: Ambulatory Visit | Attending: Obstetrics and Gynecology | Admitting: Obstetrics and Gynecology

## 2023-10-02 ENCOUNTER — Ambulatory Visit (INDEPENDENT_AMBULATORY_CARE_PROVIDER_SITE_OTHER): Admitting: Obstetrics and Gynecology

## 2023-10-02 ENCOUNTER — Encounter: Payer: Self-pay | Admitting: Obstetrics and Gynecology

## 2023-10-02 VITALS — BP 119/76 | HR 88 | Ht 64.0 in | Wt 184.0 lb

## 2023-10-02 DIAGNOSIS — N920 Excessive and frequent menstruation with regular cycle: Secondary | ICD-10-CM | POA: Diagnosis not present

## 2023-10-02 DIAGNOSIS — N939 Abnormal uterine and vaginal bleeding, unspecified: Secondary | ICD-10-CM

## 2023-10-02 LAB — POCT URINE PREGNANCY: Preg Test, Ur: NEGATIVE

## 2023-10-02 NOTE — Progress Notes (Signed)
      GYNECOLOGY OFFICE PROCEDURE NOTE   Megan Powell is a 33 y.o. [phone] here for endometrial biopsy for AUB. Recent ultrasound also showed El62mm, thickened esp posteriorly, otherwise normal uterus.  Today, she reports no concerning symptoms. Of note, pap on 11/26/22 was NILM, negative HPV.   ENDOMETRIAL BIOPSY     The indications for endometrial biopsy were reviewed.   Risks of the biopsy including cramping, bleeding, infection, uterine perforation, inadequate specimen and need for additional procedures were discussed. Offered alternative of hysteroscopy, dilation and curettage in OR. The patient states she understands the R/B/I/A and agrees to undergo procedure today. Urine pregnancy test was Negative. Consent was signed. Time out was performed.    Patient was positioned in dorsal lithotomy position. A vaginal speculum was placed.  The cervix was visualized and was prepped with Betadine.  A single-toothed tenaculum was placed on the anterior lip of the cervix to stabilize it. The 3 mm pipelle was easily introduced into the endometrial cavity without difficulty to a depth of 9 cm, and a Moderate amount of tissue was obtained after one pass and sent to pathology. The instruments were removed from the patient's vagina. Minimal bleeding from the cervix was noted. The patient tolerated the procedure well.   Patient was given post procedure instructions.  Will follow up pathology and manage accordingly; patient will be contacted with results and recommendations.  Routine preventative health maintenance measures emphasized.     Kieth Carolin, MD Obstetrician & Gynecologist, [facility] for [employer], [facility]

## 2023-10-02 NOTE — Patient Instructions (Signed)
 Laparoscopic versus vaginal hysterectomy

## 2023-10-02 NOTE — Progress Notes (Signed)
   RETURN GYNECOLOGY VISIT  Subjective:  Megan Powell is a 33 y.o. [id number] with LMP 09/16/23 presenting for follow up of AUB  Heavy, painful cycles progressively worsening since 2021. Cycles regular but bleeding can last for 10-11 days. Will pass clots and change tampon q2 hours. Has tried ibuprofen , heat without improvement. Declines hormonal management and desires definitive management with hysterectomy.   She presents today for EMB and discussion of hysterectomy. No significant improvement with lysteda   Her workup includes the following: - CBC 11/26/22 Hgb 13.4 - Pap 11/26/22 NILM/HPV neg - TSH 11/26/22 1.56 - PRL 09/28/22 10.4 - Total T 09/28/22 36 - Pelvic US  01/23/23 10 x 7 x 5cm uterus with 1.6cm EL with debris/clot, no masses, 3.5cm stable cyst with internal echos - SHG 03/20/23 normal uterus/ovaries; no lesions but thickened irregular endometrium with posterior being larger than anterior  History notable for PP BTL, SVD x4  Objective:   Vitals:   10/02/23 0955 10/02/23 1002  BP: (!) 155/87 119/76  Pulse: 91 88  Weight: 184 lb (83.5 kg)   Height: 5' 4 (1.626 m)    General:  Alert, oriented and cooperative. Patient is in no acute distress.  Skin: Skin is warm and dry. No rash noted.   Cardiovascular: Normal heart rate noted  Respiratory: Normal respiratory effort, no problems with respiration noted  Abdomen: Soft, non-tender, non-distended   Pelvic: NEFG. Uterus normal size, mobile with reasonable descensus. Nontender  Exam performed in the presence of a chaperone  Assessment and Plan:  Megan Powell is a 33 y.o. with AUB  1. Abnormal uterine bleeding (AUB) (Primary) EMB, GC/CT/trich testing to round out workup No clear structural cause - Diagnosis: AUB/dysmenorrhea - Planned surgery: total vaginal hysterectomy, cystoscopy  -- we discussed route of hysterectomy and pros/cons of TVH vs TLH. Reviewed potential for easier recovery, shorter operative time with TVH but can't  guarantee I will be able to remove fallopian tubes  -- she is considering her options; will book as TVH for now - Risks of surgery include but are not limited to:  Bleeding - Can bleed enough to need transfusion or need for additional surgeries I.e. conversation to open surgery.  Infection - The vagina can develop cuff infection Injury to surrounding organs/tissues (i.e. bowel/bladder/ureters) - discussed how each of these is managed I.e. bladder injury requires catheter for 10-14 days after surgery Need for additional procedures - Would be specific to a complication or injury Wound complications - No specific wound unless converted to open surgery or laparoscopic Hospital re-admission - In the event of a delayed complication being recognized I.e. ureteral injury discussed Conversion to open surgery - reviewed some complications may necessitate or it may be the only way to complete the surgery.   VTE - Discussed risk of blood clots following delivery Incomplete resolution of symptoms - We discussed alternatives to hysterectomy including birth control options that will work better to regulate bleeding, I.e. IUD, endometrial ablation. We reviewed that hysterectomy is the highest risk option. She desires definitive management.  - We discussed postop restrictions, precautions and expectations - Preop testing needed:  per anesthesia - All questions answered - POCT urine pregnancy - Surgical pathology( [facility]/ [facility]) - Cervicovaginal ancillary only( )  Kieth JAYSON Carolin, MD

## 2023-10-03 ENCOUNTER — Ambulatory Visit: Payer: Self-pay | Admitting: Obstetrics and Gynecology

## 2023-10-03 LAB — CERVICOVAGINAL ANCILLARY ONLY
Chlamydia: NEGATIVE
Comment: NEGATIVE
Comment: NEGATIVE
Comment: NORMAL
Neisseria Gonorrhea: NEGATIVE
Trichomonas: NEGATIVE

## 2023-10-04 LAB — SURGICAL PATHOLOGY

## 2023-10-26 ENCOUNTER — Other Ambulatory Visit: Payer: Self-pay | Admitting: Medical Genetics

## 2023-10-29 ENCOUNTER — Encounter: Payer: Self-pay | Admitting: Sports Medicine

## 2023-11-01 ENCOUNTER — Ambulatory Visit (INDEPENDENT_AMBULATORY_CARE_PROVIDER_SITE_OTHER): Admitting: Medical-Surgical

## 2023-11-01 ENCOUNTER — Encounter: Payer: Self-pay | Admitting: Medical-Surgical

## 2023-11-01 VITALS — BP 125/75 | HR 75 | Resp 20 | Ht 64.0 in | Wt 179.0 lb

## 2023-11-01 DIAGNOSIS — Z0000 Encounter for general adult medical examination without abnormal findings: Secondary | ICD-10-CM | POA: Diagnosis not present

## 2023-11-01 DIAGNOSIS — Z13220 Encounter for screening for lipoid disorders: Secondary | ICD-10-CM | POA: Diagnosis not present

## 2023-11-01 DIAGNOSIS — F5081 Binge eating disorder, mild: Secondary | ICD-10-CM

## 2023-11-01 MED ORDER — LISDEXAMFETAMINE DIMESYLATE 50 MG PO CAPS: 50.0000 mg | ORAL_CAPSULE | Freq: Every day | ORAL | 0 refills | Status: DC

## 2023-11-01 NOTE — Patient Instructions (Signed)

## 2023-11-01 NOTE — Progress Notes (Signed)
 Complete physical exam  Patient: Megan Powell   DOB: 06/26/1990   33 y.o. Female  MRN: [id number]  Subjective:    Chief Complaint  Patient presents with   Annual Exam    Megan Powell is a 33 y.o. female who presents today for a complete physical exam. She reports consuming a general diet. Weight lifting four days weekly She generally feels well. She reports sleeping fairly well. She does not have additional problems to discuss today.    Most recent fall risk assessment:    09/28/2022    4:10 PM  Fall Risk   Falls in the past year? 0  Number falls in past yr: 0  Injury with Fall? 0  Risk for fall due to : No Fall Risks  Follow up Falls evaluation completed     Most recent depression screenings:    03/20/2023   10:52 AM 09/28/2022    4:11 PM  PHQ 2/9 Scores  PHQ - 2 Score 0 0    Vision:Within last year and Dental: No current dental problems and Receives regular dental care    Patient Care Team: Willo Mini, NP as PCP - General (Nurse Practitioner)   Outpatient Medications Prior to Visit  Medication Sig   tretinoin  (RETIN-A ) 0.05 % cream Apply topically at bedtime.   [DISCONTINUED] lisdexamfetamine (VYVANSE ) 40 MG capsule Take 1 capsule (40 mg total) by mouth every morning.   No facility-administered medications prior to visit.    Review of Systems  Constitutional:  Negative for chills, fever, malaise/fatigue and weight loss.  HENT:  Negative for congestion, ear pain, hearing loss, sinus pain and sore throat.   Eyes:  Negative for blurred vision, photophobia and pain.  Respiratory:  Negative for cough, shortness of breath and wheezing.   Cardiovascular:  Negative for chest pain, palpitations and leg swelling.  Gastrointestinal:  Negative for abdominal pain, constipation, diarrhea, heartburn, nausea and vomiting.  Genitourinary:  Negative for dysuria, frequency and urgency.  Musculoskeletal:  Negative for falls and neck pain.  Skin:  Negative for itching and  rash.  Neurological:  Negative for dizziness, weakness and headaches.  Endo/Heme/Allergies:  Negative for polydipsia. Does not bruise/bleed easily.  Psychiatric/Behavioral:  Negative for depression, substance abuse and suicidal ideas. The patient has insomnia. The patient is not nervous/anxious.      Objective:    BP 125/75 (BP Location: Left Arm, Cuff Size: Normal)   Pulse 75   Resp 20   Ht 5' 4 (1.626 m)   Wt 179 lb (81.2 kg)   LMP 09/16/2023   SpO2 100%   BMI 30.73 kg/m    Physical Exam Constitutional:      General: She is not in acute distress.    Appearance: Normal appearance. She is not ill-appearing.  HENT:     Head: Normocephalic and atraumatic.     Right Ear: Tympanic membrane, ear canal and external ear normal. There is no impacted cerumen.     Left Ear: Tympanic membrane, ear canal and external ear normal. There is no impacted cerumen.     Nose: Nose normal. No congestion or rhinorrhea.     Mouth/Throat:     Mouth: Mucous membranes are moist.     Pharynx: No oropharyngeal exudate or posterior oropharyngeal erythema.  Eyes:     General: No scleral icterus.       Right eye: No discharge.        Left eye: No discharge.     Extraocular Movements: Extraocular movements  intact.     Conjunctiva/sclera: Conjunctivae normal.     Pupils: Pupils are equal, round, and reactive to light.  Neck:     Thyroid : No thyromegaly.     Vascular: No carotid bruit or JVD.     Trachea: Trachea normal.  Cardiovascular:     Rate and Rhythm: Normal rate and regular rhythm.     Pulses: Normal pulses.     Heart sounds: Normal heart sounds. No murmur heard.    No friction rub. No gallop.  Pulmonary:     Effort: Pulmonary effort is normal. No respiratory distress.     Breath sounds: Normal breath sounds. No wheezing.  Abdominal:     General: Bowel sounds are normal. There is no distension.     Palpations: Abdomen is soft.     Tenderness: There is no abdominal tenderness. There is no  guarding.  Musculoskeletal:        General: Normal range of motion.     Cervical back: Normal range of motion and neck supple.  Lymphadenopathy:     Cervical: No cervical adenopathy.  Skin:    General: Skin is warm and dry.  Neurological:     Mental Status: She is alert and oriented to person, place, and time.     Cranial Nerves: No cranial nerve deficit.  Psychiatric:        Mood and Affect: Mood normal.        Behavior: Behavior normal.        Thought Content: Thought content normal.        Judgment: Judgment normal.      No results found for any visits on 11/01/23.     Assessment & Plan:    Routine Health Maintenance and Physical Exam  Immunization History  Administered Date(s) Administered   Influenza,inj,Quad PF,6+ Mos 12/05/2018   Influenza-Unspecified 11/15/2016   Tdap 04/29/2015, 01/30/2019    Health Maintenance  Topic Date Due   Hepatitis B Vaccines 19-59 Average Risk (1 of 3 - 19+ 3-dose series) Never done   HPV VACCINES (1 - Risk 3-dose SCDM series) Never done   Influenza Vaccine  05/26/2024 (Originally 09/27/2023)   Cervical Cancer Screening (HPV/Pap Cotest)  11/26/2027   DTaP/Tdap/Td (3 - Td or Tdap) 01/29/2029   Hepatitis C Screening  Completed   HIV Screening  Completed   Meningococcal B Vaccine  Aged Out   Pneumococcal Vaccine  Discontinued   COVID-19 Vaccine  Discontinued    Discussed health benefits of physical activity, and encouraged her to engage in regular exercise appropriate for her age and condition.  1. Annual physical exam (Primary) Checking labs as below. UTD on preventative care. Wellness information provided with AVS. - Lipid panel - CBC with Differential/Platelet - CMP14+EGFR  2. Mild binge-eating disorder Increasing Vyvanse  to 50mg  daily for better control of appetite and focus.  - lisdexamfetamine (VYVANSE ) 50 MG capsule; Take 1 capsule (50 mg total) by mouth daily.  Dispense: 30 capsule; Refill: 0  3. Lipid screening Checking  lipids.  Return in about 4 weeks (around 11/29/2023) for ADHD follow up.   Janeshia Ciliberto, NP

## 2023-11-02 LAB — CMP14+EGFR
ALT: 10 IU/L (ref 0–32)
AST: 16 IU/L (ref 0–40)
Albumin: 4.8 g/dL (ref 3.9–4.9)
Alkaline Phosphatase: 47 IU/L (ref 44–121)
BUN/Creatinine Ratio: 13 (ref 9–23)
BUN: 10 mg/dL (ref 6–20)
Bilirubin Total: 0.6 mg/dL (ref 0.0–1.2)
CO2: 17 mmol/L — ABNORMAL LOW (ref 20–29)
Calcium: 9.8 mg/dL (ref 8.7–10.2)
Chloride: 102 mmol/L (ref 96–106)
Creatinine, Ser: 0.78 mg/dL (ref 0.57–1.00)
Globulin, Total: 2.6 g/dL (ref 1.5–4.5)
Glucose: 69 mg/dL — ABNORMAL LOW (ref 70–99)
Potassium: 4.2 mmol/L (ref 3.5–5.2)
Sodium: 139 mmol/L (ref 134–144)
Total Protein: 7.4 g/dL (ref 6.0–8.5)
eGFR: 104 mL/min/1.73 (ref >59)

## 2023-11-02 LAB — CBC WITH DIFFERENTIAL/PLATELET
Basophils Absolute: 0.1 x10E3/uL (ref 0.0–0.2)
Basos: 1 %
EOS (ABSOLUTE): 0 x10E3/uL (ref 0.0–0.4)
Eos: 1 %
Hematocrit: 44 % (ref 34.0–46.6)
Hemoglobin: 14.4 g/dL (ref 11.1–15.9)
Immature Grans (Abs): 0 x10E3/uL (ref 0.0–0.1)
Immature Granulocytes: 0 %
Lymphocytes Absolute: 2.3 x10E3/uL (ref 0.7–3.1)
Lymphs: 30 %
MCH: 30.4 pg (ref 26.6–33.0)
MCHC: 32.7 g/dL (ref 31.5–35.7)
MCV: 93 fL (ref 79–97)
Monocytes Absolute: 0.6 x10E3/uL (ref 0.1–0.9)
Monocytes: 8 %
Neutrophils Absolute: 4.7 x10E3/uL (ref 1.4–7.0)
Neutrophils: 60 %
Platelets: 294 x10E3/uL (ref 150–450)
RBC: 4.73 x10E6/uL (ref 3.77–5.28)
RDW: 13.6 % (ref 11.7–15.4)
WBC: 7.6 x10E3/uL (ref 3.4–10.8)

## 2023-11-02 LAB — LIPID PANEL
Chol/HDL Ratio: 3.8 ratio (ref 0.0–4.4)
Cholesterol, Total: 198 mg/dL (ref 100–199)
HDL: 52 mg/dL (ref >39)
LDL Chol Calc (NIH): 136 mg/dL — ABNORMAL HIGH (ref 0–99)
Triglycerides: 54 mg/dL (ref 0–149)
VLDL Cholesterol Cal: 10 mg/dL (ref 5–40)

## 2023-11-03 ENCOUNTER — Ambulatory Visit: Payer: Self-pay | Admitting: Medical-Surgical

## 2023-12-06 ENCOUNTER — Encounter: Payer: Self-pay | Admitting: Medical-Surgical

## 2023-12-06 ENCOUNTER — Telehealth: Admitting: Medical-Surgical

## 2023-12-06 DIAGNOSIS — F900 Attention-deficit hyperactivity disorder, predominantly inattentive type: Secondary | ICD-10-CM | POA: Diagnosis not present

## 2023-12-06 MED ORDER — LISDEXAMFETAMINE DIMESYLATE 60 MG PO CAPS: 60.0000 mg | ORAL_CAPSULE | Freq: Every day | ORAL | 0 refills | Status: DC

## 2023-12-06 NOTE — Progress Notes (Signed)
 Virtual Visit via Video Note  I connected with Megan Powell on 12/06/23 at 10:50 AM EDT by a video enabled telemedicine application and verified that I am speaking with the correct person using two identifiers.   I discussed the limitations of evaluation and management by telemedicine and the availability of in person appointments. The patient expressed understanding and agreed to proceed.  Patient location: home Provider locations: office  Subjective:    CC: ADHD follow up   HPI: Pleasant 33 year old female presenting via MyChart video visit for follow up on ADHD. She is taking Vyvanse  50mg  daily, tolerating well without side effects. Has not noted much difference with the bump in dose and is interested in increasing her dose to 60mg  daily. No trouble sleeping, increased anxiety, or palpitations.   Past medical history, Surgical history, Family history not pertinant except as noted below, Social history, Allergies, and medications have been entered into the medical record, reviewed, and corrections made.   Review of Systems: See HPI for pertinent positives and negatives.   Objective:    General: Speaking clearly in complete sentences without any shortness of breath.  Alert and oriented x3.  Normal judgment. No apparent acute distress.  Impression and Recommendations:    1. Attention deficit hyperactivity disorder (ADHD), predominantly inattentive type (Primary) Increase Vyvanse  to 60mg  daily.  - lisdexamfetamine (VYVANSE ) 60 MG capsule; Take 1 capsule (60 mg total) by mouth daily.  Dispense: 30 capsule; Refill: 0   I discussed the assessment and treatment plan with the patient. The patient was provided an opportunity to ask questions and all were answered. The patient agreed with the plan and demonstrated an understanding of the instructions.   The patient was advised to call back or seek an in-person evaluation if the symptoms worsen or if the condition fails to improve as  anticipated.  Return in about 4 weeks (around 01/03/2024) for ADHD follow up.  Zada FREDRIK Palin, DNP, APRN, FNP-BC [facility] and Sports Medicine

## 2023-12-09 ENCOUNTER — Encounter: Payer: Self-pay | Admitting: Medical-Surgical

## 2023-12-13 ENCOUNTER — Telehealth: Payer: Self-pay

## 2023-12-13 ENCOUNTER — Other Ambulatory Visit (HOSPITAL_COMMUNITY): Payer: Self-pay

## 2023-12-13 NOTE — Telephone Encounter (Signed)
 Pharmacy Patient Advocate Encounter   Received notification from CoverMyMeds that prior authorization for Lisdexamfetamine 60mg  caps is required/requested.   Insurance verification completed.   The patient is insured through [employer].   Per test claim: PA required; PA submitted to above mentioned insurance via Latent Key/confirmation #/EOC [id number] Status is pending   We would need to see a copy of the Primary insurance card to process or if there is not any other insurance, the patient will need to call medicaid to get this fixed.

## 2023-12-16 ENCOUNTER — Other Ambulatory Visit: Payer: Self-pay | Admitting: Genetic Counselor

## 2023-12-16 DIAGNOSIS — Z006 Encounter for examination for normal comparison and control in clinical research program: Secondary | ICD-10-CM

## 2023-12-19 ENCOUNTER — Other Ambulatory Visit (HOSPITAL_COMMUNITY): Payer: Self-pay

## 2024-01-03 ENCOUNTER — Telehealth: Admitting: Medical-Surgical

## 2024-01-03 DIAGNOSIS — F900 Attention-deficit hyperactivity disorder, predominantly inattentive type: Secondary | ICD-10-CM | POA: Diagnosis not present

## 2024-01-03 MED ORDER — LISDEXAMFETAMINE DIMESYLATE 60 MG PO CAPS: 60.0000 mg | ORAL_CAPSULE | Freq: Every day | ORAL | 0 refills | Status: DC

## 2024-01-03 NOTE — Progress Notes (Signed)
 Virtual Visit via Video Note  I connected with Megan Powell on 01/03/24 at 10:30 AM EST by a video enabled telemedicine application and verified that I am speaking with the correct person using two identifiers.   I discussed the limitations of evaluation and management by telemedicine and the availability of in person appointments. The patient expressed understanding and agreed to proceed.  Patient location: home Provider locations: office  Subjective:    CC: ADHD follow up  HPI: Pleasant 33 year old female presenting via MyChart video visit for follow-up on ADHD.  She has been taking Vyvanse  60 mg daily and feels that the medication is working well.  She noticed a difference with the increase to 60 mg and would like to stay at this dose for several months.  Does note a little dry mouth but no other significant side effects.  No change in sleep pattern or worsening of anxiety.  Has noted weight loss of approximately 4 pounds but this is intentional.  Past medical history, Surgical history, Family history not pertinant except as noted below, Social history, Allergies, and medications have been entered into the medical record, reviewed, and corrections made.   Review of Systems: See HPI for pertinent positives and negatives.   Objective:    General: Speaking clearly in complete sentences without any shortness of breath.  Alert and oriented x3.  Normal judgment. No apparent acute distress.  Impression and Recommendations:    1. Attention deficit hyperactivity disorder (ADHD), predominantly inattentive type New dose is well-tolerated and effective.  Continue Vyvanse  60 mg daily.  I discussed the assessment and treatment plan with the patient. The patient was provided an opportunity to ask questions and all were answered. The patient agreed with the plan and demonstrated an understanding of the instructions.   The patient was advised to call back or seek an in-person evaluation if the  symptoms worsen or if the condition fails to improve as anticipated.  Return in about 3 months (around 04/04/2024) for ADHD follow up.  Megan FREDRIK Palin, DNP, APRN, FNP-BC [facility] and Sports Medicine

## 2024-01-07 ENCOUNTER — Other Ambulatory Visit: Payer: Self-pay | Admitting: Obstetrics and Gynecology

## 2024-01-07 DIAGNOSIS — N939 Abnormal uterine and vaginal bleeding, unspecified: Secondary | ICD-10-CM

## 2024-01-07 NOTE — Progress Notes (Signed)
OR orders entered 

## 2024-01-14 ENCOUNTER — Encounter (HOSPITAL_COMMUNITY): Payer: Self-pay | Admitting: Obstetrics and Gynecology

## 2024-01-14 NOTE — Pre-Procedure Instructions (Signed)
 Surgical Instructions  Your procedure is scheduled on :   Tuesday,  01-21-2024 Report to [facility] Main Entrance A at 9:15 AM, then check in the Admitting office. Any questions or running late day of surgery :  call [phone]  Questions prior to your surgery day:  call [phone], Monday -- Friday 8am - 4pm. If you experience any cold or flu symptoms such as cough, fever, chills, shortness of breath, etc. between now and you scheduled surgery, please notify your surgeon office.   Remember: Do not eat any food after midnight the night before surgery. You may have clear liquids from midnight night before surgery until 8:15 AM.   Clear liquids allowed are:  Water              Carbonated Beverages (diabetics choose diet or no sugar options)  Clear Tea ( no milk, no honey, etc.)  Black coffee ( NO MILK, CREAM OR POWDERED CREAMER OF ANY KIND)  Sport drinks, like Gatorade (diabetes choose diet or no sugar options)  NO clear liquid after 8:15 AM day of surgery.  This includes No water ,  candy,  gum, and mints.  Take these medicines the morning of surgery with A SIPS OF WATER :   NONE   May take these medicines IF NEEDED:   NONE   One week prior to surgery, STOP taking any Aspirin  (unless otherwise instructed by your surgeon) Aleve, Naproxen, ibuprofen , Motrin , Advil , Goody's, BC's, all herbal medications/ supplements, fish oil, and non-prescription vitamins.  Do NOT Smoke (tobacco/ vaping) and Do Not drink alcohol for 24 hours prior to your procedure.  For those patients that use a CPAP.  Please bring your CPAP/ mask/ tubing with them day of surgery . Anesthesia may ask recovery room nurse to use and if you stay the night you be asked to use it.  You will be asked to removed any contacts, glasses, piercing's, hearing aid's, dentures/ partials prior to surgery.  Please bring cases/ container/ solution/ etc., for them day of surgery.   Patients discharged the day of surgery will NOT be  allowed to drive home.  You must have responsible driver and caregiver to stay at home with you the next 24 hours.  SURGICAL WAITING ROOM VISITATION Patients may have no more than 2 support people in the waiting area - if more than 2 , these visitors may rotate.  Pre-op nurse will coordinate an appropriate time for 1 Adult support person, who may not rotate, to accompany patient in pre-op.  Aware some patients may have certain circumstances, speak to pre-op nurse day of surgery.  Children under the age 31 must have an adult with them who is not the patient and must remain in the main waiting area with an adult.  If the patient needs to stay at the hospital during part of their recovery, the visitor guidelines for inpatient rooms apply.  Please refer to the [facility] website for the visitor guidelines for any additional information.  If you received a COVID test during your pre-op visit it is requested that you wear a mask when out in public, stay away from anyone that may not be feeling well and notify your surgeon if you develop symptoms.  If you have been in contact with anyone that has tested positive in the past 10 days notify your surgeon.     Megan Powell - Preparing for Surgery  Before surgery, you can play an important role. Because skin is not sterile, it needs to  be as free of germs as possible. You can reduce the number of germs on your skin by washing with CHG (chlorhexidine gluconate) soap before surgery. CHG is an antiseptic cleaner which kills germs and bonds with the skin to continue killing germs even after washing. Oral hygiene is also important in reducing the risk of infection. Remember to brush your teeth with your regular toothpaste the morning of surgery.  Please DO NOT use if you have an allergy to CHG or antibacterial soaps. If your skin becomes reddened/irritated stop using the CHG and inform your Pre-op nurse day of surgery.  DO NOT shave (including legs and genital  area) for at least 48 hours prior to your CHG shower.   Please follow these instructions carefully:  Shower with CHG soap the night before surgery. If you choose to wash your hair, wash your hair first as usual with your normal shampoo. After you shampoo, rinse your hair and body thoroughly to remove the shampoo. Use CHG as you would any other liquid soap. You can apply CHG directly to the skin and wash gently with a clean washcloth or shower sponge. Apply the CHG soap to your body ONLY FROM THE NECK DOWN. Do not use on open wounds or open sores. Avoid contact with your eyes, ears, mouth, and genitals (private parts). Wash genitals (private parts) with your normal soap. Wash thoroughly, paying special attention to the area where your surgery will be performed. Thoroughly rinse your body with warm water  from the neck down. DO NOT shower/wash with your normal soap after using and rinsing off the CHG soap. DO NOT use lotions, oils, etc., after showering with CHG. Pat yourself dry with a clean towel. Wear clean pajamas. Place clean sheets on your bed the night of your CHG shower and do not sleep with pets.  Day of Surgery  DO NOT Apply any lotions,  powder,  oils,  deodorants (may use underarm deodorant),  cologne/  perfumes  or makeup Do Not wear jewelry /  piercing's/  metal/  permanent jewelry must be removed prior to arrival day of surgery. (No plastic piercing) Do Not wear nail polish,  gel polish,  artificial nails, or any other type of covering on natural finger nails (toe nails are okay) Remember to brush your teeth and rinse mouth out. Put on clean / comfortable clothes. [place] is not responsible for valuables/ personal belongings

## 2024-01-14 NOTE — Progress Notes (Addendum)
 Addendum:  Actual weight done today at lab appointment, 172.8 lb   Spoke w/ via phone for pre-op interview---pt Lab needs dos----  upt       Lab results------ lab appt 01-17-2024 @ 1030 getting CBC/ T& S COVID test -----patient states asymptomatic no test needed Arrive at -------  0915 on 01-21-2024 NPO after MN NO Solid Food.  Clear liquids from MN until--- 0815 Pre-Surgery Ensure or G2: n/a  Med rec completed Medications to take morning of surgery ----- none Diabetic medication ----- n/a  GLP1 agonist last dose: n/a GLP1 instructions:  Patient instructed no nail polish to be worn day of surgery Patient instructed to bring photo id and insurance card day of surgery Patient aware to have Driver (ride ) / caregiver    for 24 hours after surgery - husband, Megan Powell Patient Special Instructions ----- will pick up soap and written instructions at lab appt Pre-Op special Instructions ----- n/a  Patient verbalized understanding of instructions that were given at this phone interview. Patient denies chest pain, sob, fever, cough at the interview.

## 2024-01-17 ENCOUNTER — Encounter (HOSPITAL_COMMUNITY)
Admission: RE | Admit: 2024-01-17 | Discharge: 2024-01-17 | Disposition: A | Discharge status: Home / Self Care | Source: Ambulatory Visit | Attending: Obstetrics and Gynecology | Admitting: Obstetrics and Gynecology

## 2024-01-17 DIAGNOSIS — Z01812 Encounter for preprocedural laboratory examination: Secondary | ICD-10-CM | POA: Diagnosis not present

## 2024-01-17 DIAGNOSIS — N939 Abnormal uterine and vaginal bleeding, unspecified: Secondary | ICD-10-CM | POA: Diagnosis not present

## 2024-01-17 LAB — CBC
HCT: 41.1 % (ref 36.0–46.0)
Hemoglobin: 13.6 g/dL (ref 12.0–15.0)
MCH: 29.7 pg (ref 26.0–34.0)
MCHC: 33.1 g/dL (ref 30.0–36.0)
MCV: 89.7 fL (ref 80.0–100.0)
Platelets: 287 K/uL (ref 150–400)
RBC: 4.58 MIL/uL (ref 3.87–5.11)
RDW: 13.2 % (ref 11.5–15.5)
WBC: 6.7 K/uL (ref 4.0–10.5)
nRBC: 0 % (ref 0.0–0.2)

## 2024-01-17 LAB — TYPE AND SCREEN
ABO/RH(D): A NEG
Antibody Screen: NEGATIVE

## 2024-01-20 NOTE — Anesthesia Preprocedure Evaluation (Signed)
 Anesthesia Evaluation  Patient identified by MRN, date of birth, ID band Patient awake    Reviewed: Allergy & Precautions, NPO status , Patient's Chart, lab work & pertinent test results  Airway Mallampati: II  TM Distance: >3 FB Neck ROM: Full    Dental no notable dental hx.    Pulmonary neg pulmonary ROS, neg recent URI   Pulmonary exam normal breath sounds clear to auscultation       Cardiovascular negative cardio ROS Normal cardiovascular exam Rhythm:Regular Rate:Normal     Neuro/Psych  PSYCHIATRIC DISORDERS      negative neurological ROS     GI/Hepatic negative GI ROS, Neg liver ROS,neg GERD  ,,  Endo/Other  negative endocrine ROS    Renal/GU negative Renal ROS     Musculoskeletal negative musculoskeletal ROS (+)    Abdominal   Peds  Hematology negative hematology ROS (+)   Anesthesia Other Findings   Reproductive/Obstetrics negative OB ROS                              Anesthesia Physical Anesthesia Plan  ASA: 1  Anesthesia Plan: General   Post-op Pain Management: Tylenol  PO (pre-op)*, Gabapentin  PO (pre-op)* and Toradol IV (intra-op)*   Induction: Intravenous  PONV Risk Score and Plan: 4 or greater and Scopolamine  patch - Pre-op, Treatment may vary due to age or medical condition, Midazolam , TIVA, Propofol  infusion, Dexamethasone  and Ondansetron   Airway Management Planned: Oral ETT  Additional Equipment:   Intra-op Plan:   Post-operative Plan: Extubation in OR  Informed Consent: I have reviewed the patients History and Physical, chart, labs and discussed the procedure including the risks, benefits and alternatives for the proposed anesthesia with the patient or authorized representative who has indicated his/her understanding and acceptance.     Dental advisory given  Plan Discussed with: CRNA  Anesthesia Plan Comments: (Risks of anesthesia explained at length.  This includes, but is not limited to, sore throat, damage to teeth, lips gums, tongue and vocal cords, nausea and vomiting, reactions to medications, stroke, heart attack, and death. All patient questions were answered and the patient wishes to proceed. )         Anesthesia Quick Evaluation

## 2024-01-21 ENCOUNTER — Encounter: Payer: Self-pay | Admitting: Obstetrics and Gynecology

## 2024-01-21 ENCOUNTER — Ambulatory Visit (HOSPITAL_COMMUNITY): Payer: Self-pay | Admitting: Anesthesiology

## 2024-01-21 ENCOUNTER — Ambulatory Visit (HOSPITAL_COMMUNITY)
Admission: RE | Admit: 2024-01-21 | Discharge: 2024-01-21 | Disposition: A | Discharge status: Home / Self Care | Attending: Obstetrics and Gynecology | Admitting: Obstetrics and Gynecology

## 2024-01-21 ENCOUNTER — Encounter (HOSPITAL_COMMUNITY): Payer: Self-pay | Admitting: Obstetrics and Gynecology

## 2024-01-21 ENCOUNTER — Encounter (HOSPITAL_COMMUNITY): Payer: Self-pay | Admitting: Anesthesiology

## 2024-01-21 ENCOUNTER — Encounter (HOSPITAL_COMMUNITY)
Admission: RE | Disposition: A | Discharge status: Home / Self Care | Payer: Self-pay | Source: Home / Self Care | Attending: Obstetrics and Gynecology

## 2024-01-21 DIAGNOSIS — Z01818 Encounter for other preprocedural examination: Secondary | ICD-10-CM

## 2024-01-21 DIAGNOSIS — N80122 Deep endometriosis of left ovary: Secondary | ICD-10-CM | POA: Diagnosis not present

## 2024-01-21 DIAGNOSIS — N80102 Endometriosis of left ovary, unspecified depth: Secondary | ICD-10-CM | POA: Diagnosis not present

## 2024-01-21 DIAGNOSIS — N938 Other specified abnormal uterine and vaginal bleeding: Secondary | ICD-10-CM | POA: Diagnosis not present

## 2024-01-21 DIAGNOSIS — N946 Dysmenorrhea, unspecified: Secondary | ICD-10-CM | POA: Diagnosis not present

## 2024-01-21 DIAGNOSIS — N939 Abnormal uterine and vaginal bleeding, unspecified: Secondary | ICD-10-CM | POA: Diagnosis not present

## 2024-01-21 DIAGNOSIS — N736 Female pelvic peritoneal adhesions (postinfective): Secondary | ICD-10-CM | POA: Diagnosis not present

## 2024-01-21 HISTORY — DX: Attention-deficit hyperactivity disorder, unspecified type: F90.9

## 2024-01-21 HISTORY — DX: Abnormal uterine and vaginal bleeding, unspecified: N93.9

## 2024-01-21 HISTORY — PX: CYSTOSCOPY: SHX5120

## 2024-01-21 HISTORY — PX: LAPAROSCOPIC BILATERAL SALPINGECTOMY: SHX5889

## 2024-01-21 HISTORY — DX: Acne, unspecified: L70.9

## 2024-01-21 HISTORY — DX: Binge eating disorder, mild: F50.810

## 2024-01-21 HISTORY — PX: LAPAROSCOPIC HYSTERECTOMY: SHX1926

## 2024-01-21 LAB — POCT PREGNANCY, URINE: Preg Test, Ur: NEGATIVE

## 2024-01-21 SURGERY — HYSTERECTOMY, TOTAL, LAPAROSCOPIC
Anesthesia: General | Site: Uterus

## 2024-01-21 MED ORDER — ACETAMINOPHEN 500 MG PO TABS
1000.0000 mg | ORAL_TABLET | ORAL | Status: AC
  Administered 2024-01-21: 1000 mg via ORAL

## 2024-01-21 MED ORDER — MIDAZOLAM HCL (PF) 2 MG/2ML IJ SOLN
INTRAMUSCULAR | Status: DC | PRN
  Administered 2024-01-21: 2 mg via INTRAVENOUS

## 2024-01-21 MED ORDER — GABAPENTIN 300 MG PO CAPS
ORAL_CAPSULE | ORAL | Status: AC
  Filled 2024-01-21: qty 1

## 2024-01-21 MED ORDER — LACTATED RINGERS IV SOLN: INTRAVENOUS | Status: DC

## 2024-01-21 MED ORDER — LIDOCAINE 2% (20 MG/ML) 5 ML SYRINGE
INTRAMUSCULAR | Status: DC | PRN
  Administered 2024-01-21: 80 mg via INTRAVENOUS

## 2024-01-21 MED ORDER — SCOPOLAMINE 1 MG/3DAYS TD PT72
1.0000 | MEDICATED_PATCH | TRANSDERMAL | Status: DC
  Administered 2024-01-21: 1 mg via TRANSDERMAL

## 2024-01-21 MED ORDER — CELECOXIB 200 MG PO CAPS
ORAL_CAPSULE | ORAL | Status: AC
  Filled 2024-01-21: qty 2

## 2024-01-21 MED ORDER — CEFAZOLIN SODIUM-DEXTROSE 2-4 GM/100ML-% IV SOLN
2.0000 g | INTRAVENOUS | Status: AC
  Administered 2024-01-21: 2 g via INTRAVENOUS

## 2024-01-21 MED ORDER — ONDANSETRON HCL 4 MG/2ML IJ SOLN
INTRAMUSCULAR | Status: DC | PRN
  Administered 2024-01-21: 4 mg via INTRAVENOUS

## 2024-01-21 MED ORDER — SENNA 8.6 MG PO TABS: 2.0000 | ORAL_TABLET | Freq: Every evening | ORAL | 0 refills | Status: DC | PRN

## 2024-01-21 MED ORDER — OXYCODONE HCL 5 MG PO TABS
5.0000 mg | ORAL_TABLET | Freq: Once | ORAL | Status: AC | PRN
  Administered 2024-01-21: 5 mg via ORAL

## 2024-01-21 MED ORDER — OXYCODONE HCL 5 MG PO TABS
ORAL_TABLET | ORAL | Status: AC
  Filled 2024-01-21: qty 1

## 2024-01-21 MED ORDER — PHENYLEPHRINE 80 MCG/ML (10ML) SYRINGE FOR IV PUSH (FOR BLOOD PRESSURE SUPPORT)
PREFILLED_SYRINGE | INTRAVENOUS | Status: AC
  Filled 2024-01-21: qty 10

## 2024-01-21 MED ORDER — SUGAMMADEX SODIUM 200 MG/2ML IV SOLN
INTRAVENOUS | Status: DC | PRN
  Administered 2024-01-21: 200 mg via INTRAVENOUS

## 2024-01-21 MED ORDER — ROCURONIUM BROMIDE 10 MG/ML (PF) SYRINGE
PREFILLED_SYRINGE | INTRAVENOUS | Status: AC
  Filled 2024-01-21: qty 10

## 2024-01-21 MED ORDER — DEXAMETHASONE SOD PHOSPHATE PF 10 MG/ML IJ SOLN
INTRAMUSCULAR | Status: DC | PRN
  Administered 2024-01-21: 10 mg via INTRAVENOUS

## 2024-01-21 MED ORDER — IBUPROFEN 600 MG PO TABS: 600.0000 mg | ORAL_TABLET | Freq: Four times a day (QID) | ORAL | 0 refills | Status: DC | PRN

## 2024-01-21 MED ORDER — ACETAMINOPHEN 500 MG PO TABS: 1000.0000 mg | ORAL_TABLET | Freq: Four times a day (QID) | ORAL | 0 refills | Status: DC | PRN

## 2024-01-21 MED ORDER — CHLORHEXIDINE GLUCONATE 0.12 % MT SOLN
15.0000 mL | Freq: Once | OROMUCOSAL | Status: AC
  Administered 2024-01-21: 15 mL via OROMUCOSAL

## 2024-01-21 MED ORDER — DEXMEDETOMIDINE HCL IN NACL 80 MCG/20ML IV SOLN
INTRAVENOUS | Status: DC | PRN
  Administered 2024-01-21 (×2): 8 ug via INTRAVENOUS
  Administered 2024-01-21: 4 ug via INTRAVENOUS

## 2024-01-21 MED ORDER — CHLORHEXIDINE GLUCONATE 0.12 % MT SOLN
OROMUCOSAL | Status: AC
  Filled 2024-01-21: qty 15

## 2024-01-21 MED ORDER — HYDROMORPHONE HCL 1 MG/ML IJ SOLN
INTRAMUSCULAR | Status: AC
  Filled 2024-01-21: qty 1

## 2024-01-21 MED ORDER — ORAL CARE MOUTH RINSE: 15.0000 mL | Freq: Once | OROMUCOSAL | Status: AC

## 2024-01-21 MED ORDER — GABAPENTIN 300 MG PO CAPS
300.0000 mg | ORAL_CAPSULE | Freq: Once | ORAL | Status: AC
  Administered 2024-01-21: 300 mg via ORAL

## 2024-01-21 MED ORDER — SCOPOLAMINE 1 MG/3DAYS TD PT72
MEDICATED_PATCH | TRANSDERMAL | Status: AC
  Filled 2024-01-21: qty 1

## 2024-01-21 MED ORDER — ROCURONIUM BROMIDE 10 MG/ML (PF) SYRINGE
PREFILLED_SYRINGE | INTRAVENOUS | Status: DC | PRN
  Administered 2024-01-21: 70 mg via INTRAVENOUS
  Administered 2024-01-21: 30 mg via INTRAVENOUS
  Administered 2024-01-21: 20 mg via INTRAVENOUS

## 2024-01-21 MED ORDER — CEFAZOLIN SODIUM 1 G IJ SOLR
INTRAMUSCULAR | Status: AC
  Filled 2024-01-21: qty 30

## 2024-01-21 MED ORDER — EPHEDRINE SULFATE-NACL 50-0.9 MG/10ML-% IV SOSY
PREFILLED_SYRINGE | INTRAVENOUS | Status: DC | PRN
  Administered 2024-01-21: 5 mg via INTRAVENOUS

## 2024-01-21 MED ORDER — ACETAMINOPHEN 500 MG PO TABS
ORAL_TABLET | ORAL | Status: AC
  Filled 2024-01-21: qty 2

## 2024-01-21 MED ORDER — FENTANYL CITRATE (PF) 250 MCG/5ML IJ SOLN
INTRAMUSCULAR | Status: DC | PRN
Start: 2024-01-21 — End: 2024-01-21
  Administered 2024-01-21: 100 ug via INTRAVENOUS
  Administered 2024-01-21: 50 ug via INTRAVENOUS
  Administered 2024-01-21: 100 ug via INTRAVENOUS

## 2024-01-21 MED ORDER — OXYCODONE HCL 5 MG PO TABS: 5.0000 mg | ORAL_TABLET | ORAL | 0 refills | Status: DC | PRN

## 2024-01-21 MED ORDER — FENTANYL CITRATE (PF) 250 MCG/5ML IJ SOLN
INTRAMUSCULAR | Status: AC
  Filled 2024-01-21: qty 5

## 2024-01-21 MED ORDER — POVIDONE-IODINE 10 % EX SWAB: 2.0000 | Freq: Once | CUTANEOUS | Status: DC

## 2024-01-21 MED ORDER — MIDAZOLAM HCL 2 MG/2ML IJ SOLN
INTRAMUSCULAR | Status: AC
Start: 2024-01-21 — End: 2024-01-21
  Filled 2024-01-21: qty 2

## 2024-01-21 MED ORDER — 0.9 % SODIUM CHLORIDE (POUR BTL) OPTIME
TOPICAL | Status: DC | PRN
  Administered 2024-01-21: 1000 mL

## 2024-01-21 MED ORDER — CEFAZOLIN SODIUM-DEXTROSE 2-4 GM/100ML-% IV SOLN
INTRAVENOUS | Status: AC
  Filled 2024-01-21: qty 100

## 2024-01-21 MED ORDER — EPHEDRINE 5 MG/ML INJ
INTRAVENOUS | Status: AC
  Filled 2024-01-21: qty 5

## 2024-01-21 MED ORDER — SODIUM CHLORIDE 0.9 % IR SOLN
Status: DC | PRN
  Administered 2024-01-21: 1

## 2024-01-21 MED ORDER — DROPERIDOL 2.5 MG/ML IJ SOLN: 0.6250 mg | Freq: Once | INTRAMUSCULAR | Status: DC | PRN

## 2024-01-21 MED ORDER — DEXMEDETOMIDINE HCL IN NACL 80 MCG/20ML IV SOLN
INTRAVENOUS | Status: AC
  Filled 2024-01-21: qty 20

## 2024-01-21 MED ORDER — OXYCODONE HCL 5 MG/5ML PO SOLN: 5.0000 mg | Freq: Once | ORAL | Status: AC | PRN

## 2024-01-21 MED ORDER — SODIUM CHLORIDE 0.9 % IV SOLN: INTRAVENOUS | Status: DC | PRN

## 2024-01-21 MED ORDER — ONDANSETRON HCL 4 MG/2ML IJ SOLN
INTRAMUSCULAR | Status: AC
Start: 2024-01-21 — End: 2024-01-21
  Filled 2024-01-21: qty 4

## 2024-01-21 MED ORDER — HYDROMORPHONE HCL 1 MG/ML IJ SOLN
0.2500 mg | INTRAMUSCULAR | Status: DC | PRN
  Administered 2024-01-21 (×2): 0.5 mg via INTRAVENOUS

## 2024-01-21 MED ORDER — CELECOXIB 200 MG PO CAPS
400.0000 mg | ORAL_CAPSULE | ORAL | Status: AC
  Administered 2024-01-21: 400 mg via ORAL

## 2024-01-21 MED ORDER — PROPOFOL 10 MG/ML IV BOLUS
INTRAVENOUS | Status: AC
  Filled 2024-01-21: qty 20

## 2024-01-21 MED ORDER — LIDOCAINE 2% (20 MG/ML) 5 ML SYRINGE
INTRAMUSCULAR | Status: AC
  Filled 2024-01-21: qty 5

## 2024-01-21 MED ORDER — PROPOFOL 10 MG/ML IV BOLUS
INTRAVENOUS | Status: DC | PRN
  Administered 2024-01-21: 200 mg via INTRAVENOUS

## 2024-01-21 SURGICAL SUPPLY — 51 items
BLADE SURG 10 STRL SS (BLADE) IMPLANT
COVER MAYO STAND STRL (DRAPES) ×2 IMPLANT
COVER SURGICAL LIGHT HANDLE (MISCELLANEOUS) IMPLANT
DEFOGGER SCOPE WARM SEASHARP (MISCELLANEOUS) ×2 IMPLANT
DERMABOND ADVANCED .7 DNX12 (GAUZE/BANDAGES/DRESSINGS) ×2 IMPLANT
DRAPE SURG IRRIG POUCH 19X23 (DRAPES) ×2 IMPLANT
DURAPREP 26ML APPLICATOR (WOUND CARE) ×2 IMPLANT
GLOVE BIO SURGEON STRL SZ7 (GLOVE) ×2 IMPLANT
GLOVE BIOGEL PI IND STRL 7.0 (GLOVE) ×8 IMPLANT
GOWN STRL REUS W/ TWL XL LVL3 (GOWN DISPOSABLE) ×4 IMPLANT
HIBICLENS CHG 4% 4OZ BTL (MISCELLANEOUS) ×4 IMPLANT
IRRIGATION SUCT STRKRFLW 2 WTP (MISCELLANEOUS) ×2 IMPLANT
KIT PINK PAD W/HEAD ARM REST (MISCELLANEOUS) ×2 IMPLANT
KIT TURNOVER KIT B (KITS) ×2 IMPLANT
LHOOK LAP DISP 36CM (ELECTROSURGICAL) ×2 IMPLANT
LIGASURE LAP MARYLAND 5MM 37CM (ELECTROSURGICAL) IMPLANT
NDL INSUFFLATION 14GA 120MM (NEEDLE) IMPLANT
NEEDLE INSUFFLATION 14GA 120MM (NEEDLE) IMPLANT
OCCLUDER COLPOPNEUMO (BALLOONS) ×2 IMPLANT
PACK LAPAROSCOPY BASIN (CUSTOM PROCEDURE TRAY) ×2 IMPLANT
PACK ROBOTIC GOWN (GOWN DISPOSABLE) ×2 IMPLANT
PAD POSITIONING PINK XL (MISCELLANEOUS) ×2 IMPLANT
PENCIL BUTTON HOLSTER BLD 10FT (ELECTRODE) ×2 IMPLANT
POUCH LAPAROSCOPIC INSTRUMENT (MISCELLANEOUS) ×2 IMPLANT
POWDER SURGICEL 3.0 GRAM (HEMOSTASIS) IMPLANT
SCISSORS LAP 5X35 DISP (ENDOMECHANICALS) IMPLANT
SEALER TISSUE X1 STRG JAW 37MM (SHEATH) IMPLANT
SET CYSTO IRRIGATION (SET/KITS/TRAYS/PACK) ×2 IMPLANT
SET TUBE SMOKE EVAC HIGH FLOW (TUBING) ×2 IMPLANT
SLEEVE ADV FIXATION 5X100MM (TROCAR) ×4 IMPLANT
SOLN 0.9% NACL POUR BTL 1000ML (IV SOLUTION) ×2 IMPLANT
SUT PDS AB 2-0 CT2 27 (SUTURE) ×4 IMPLANT
SUT STRATA PDS 2-0 23 CT-1 (SUTURE) ×2 IMPLANT
SUT VIC AB 0 CT1 27XBRD ANBCTR (SUTURE) ×2 IMPLANT
SUT VIC AB 4-0 PS2 18 (SUTURE) ×2 IMPLANT
SUT VICRYL 0 UR6 27IN ABS (SUTURE) ×4 IMPLANT
SUT VLOC 180 0 9IN GS21 (SUTURE) ×2 IMPLANT
SYR 10ML LL (SYRINGE) ×2 IMPLANT
SYR 50ML LL SCALE MARK (SYRINGE) ×2 IMPLANT
TIP ENDOSCOPIC SURGICEL (TIP) IMPLANT
TIP RUMI ORANGE 6.7MMX12CM (TIP) IMPLANT
TIP UTERINE 5.1X6CM LAV DISP (MISCELLANEOUS) IMPLANT
TIP UTERINE 6.7X10CM GRN DISP (MISCELLANEOUS) IMPLANT
TIP UTERINE 6.7X6CM WHT DISP (MISCELLANEOUS) IMPLANT
TIP UTERINE 6.7X8CM BLUE DISP (MISCELLANEOUS) IMPLANT
TOWEL GREEN STERILE FF (TOWEL DISPOSABLE) ×4 IMPLANT
TRAY FOLEY W/BAG SLVR 14FR (SET/KITS/TRAYS/PACK) ×2 IMPLANT
TROCAR ADV FIXATION 11X100MM (TROCAR) ×2 IMPLANT
TROCAR ADV FIXATION 5X100MM (TROCAR) ×2 IMPLANT
TROCAR BALLN 12MMX100 BLUNT (TROCAR) IMPLANT
UNDERPAD 30X36 HEAVY ABSORB (UNDERPADS AND DIAPERS) ×2 IMPLANT

## 2024-01-21 NOTE — Anesthesia Procedure Notes (Addendum)
 Procedure Name: Intubation Date/Time: 01/21/2024 4:18 PM  Performed by: Hedy Jarred, CRNAPre-anesthesia Checklist: Patient identified, Emergency Drugs available, Suction available and Patient being monitored Patient Re-evaluated:Patient Re-evaluated prior to induction Oxygen Delivery Method: Circle System Utilized Preoxygenation: Pre-oxygenation with 100% oxygen Induction Type: IV induction Ventilation: Mask ventilation without difficulty Laryngoscope Size: Mac and 3 Grade View: Grade I Tube type: Oral Tube size: 7.0 mm Number of attempts: 1 Airway Equipment and Method: Stylet and Oral airway Placement Confirmation: ETT inserted through vocal cords under direct vision, positive ETCO2 and breath sounds checked- equal and bilateral Secured at: 21 cm Tube secured with: Tape Dental Injury: Teeth and Oropharynx as per pre-operative assessment

## 2024-01-21 NOTE — Op Note (Addendum)
 Megan Powell PROCEDURE DATE: 01/21/2024  PREOPERATIVE DIAGNOSIS: Abnormal uterine bleeding, pelvic pain, ovarian cyst POSTOPERATIVE DIAGNOSIS: The same PROCEDURE: Total laparoscopic hysterectomy, bilateral salpingectomy, cystoscopy SURGEON:  Dr. Kieth Carolin ASSISTANT:  Dr. Hobert Quarry.  An experienced assistant was required given the standard of surgical care given the complexity of the case.  This assistant was needed for exposure, dissection, suctioning, retraction, instrument exchange, and for overall help during the procedure.  INDICATIONS: 33 y.o. [id number]  here for definitive surgical management secondary to the indications listed under preoperative diagnoses; please see preoperative note for further details.  Risks of surgery were discussed with the patient including but not limited to: bleeding which may require transfusion or reoperation; infection which may require antibiotics; injury to bowel, bladder, ureters or other surrounding organs; need for additional procedures; thromboembolic phenomenon, incisional problems and other postoperative/anesthesia complications. Written informed consent was obtained.    FINDINGS: Mildly enlarged uterus, tubes with post-surgical changes (Filshie clips), and left ovary with endometrioma. Left ovary was adherent to the posterior uterine wall with filmy adhesions to the rectum Endometriosis on uterine specimen. Allen-Masterson window in posterior cul de sac. Bilateral ureteral jets noted on cystoscopic exam  ANESTHESIA:    General INTRAVENOUS FLUIDS:1300  ml ESTIMATED BLOOD LOSS:150 ml URINE OUTPUT: 100 ml  SPECIMENS: Uterus, cervix, bilateral fallopian tubes COMPLICATIONS: None immediate  PROCEDURE IN DETAIL:  The patient received intravenous antibiotics and had sequential compression devices applied to her lower extremities while in the preoperative area.  She was then taken to the operating room where general anesthesia was  administered and was found to be adequate.  She was placed in the dorsal lithotomy position, and was prepped and draped in a sterile manner.    After an adequate timeout was performed, a Rumi uterine manipulator was placed at this time (10cm stem, 4cm cup).  A Foley catheter was inserted into her bladder and attached to constant drainage.   Attention turned to patient's abdomen. A 5mm skin incision made with the scalpel in the umbilicus. Using the natural hernia immediately below the umbilical stalk, the peritoneum was entered bluntly with an S retractor. A 5mm balloon port was introduced. Intraperitoneal entry was again confirmed with the laparoscope and the abdomen was insufflated. Inspection below the point of entry revealed no evidence of injury. Patient placed in steep trendelenburg. Left lateral, right lateral, and suprapubic 5 mm ports were placed under direct visualization.   The pelvis was then carefully examined with the above findings noted. Blunt dissection was used to start developing a plane between the left ovary/endometrioma and the posterior uterus. The endometrioma was ruptured at this point and was drained. Sharp dissection was performed to lyse filmy adhesions between the bowel epiploica and the ovary. The left ovary was successfully freed.   The left fallopian tube was then separated from the underlying mesosalpinx and the uterine attachments using the Ligasure. The round ligament was then clamped and transected with the Ligasure. The anterior & posterior leaves of the broad ligament were separated and we began to develop the bladder flap. A window was then made in the posterior leaf of the broad to isolate the uteroovarian ligament, drop the left ureter further away from the area of dissection, and further skeletonize the uterine artery. The uteroovarian ligament was then clamped, sealed, and transected with the Ligasure. Attention was turned to the right side where the procedure was  repeated - fallopian tube removed, round ligament sealed & transected and anterior & posterior leaves of  the broad were developed. There was no anatomical abnormalities on the right side and the ureter could be seen well away from areas of dissection. The uteroovarian ligament was clamped, sealed and transected with the Ligasure. The posterior leaf was further developed, the right uterine artery was skeletonized and the ureter dropped well away from the operative field. The bladder flap was then further developed with blunt dissection & the Ligasure. At this point, attention was turned to the uterine vessels, which were clamped and ligated with the Ligasure. Attention was then turned to the cervicovaginal junction, and the bovie hook was used to transect the cervix from the surrounding vagina using the ring of the Rumi as a guide. This was done circumferentially allowing total hysterectomy.  The uterus was then removed from the vagina and the vaginal cuff incision was then closed with 0 V-loc in a running fashion.  Overall excellent hemostasis was noted. The abdominal pressure was reduced and hemostasis was confirmed.     Attention was turned to cystoscopy which confirmed bilateral ureteral jets.  No stitches were visualized in the bladder during cystoscopy.  All trocars were removed under direct visualization, and the abdomen was desufflated. All skin incisions were closed with 4-0 Vicryl subcuticular stitches and Dermabond. The patient tolerated the procedures well.    All instruments, needles, and sponge counts were correct x 2. The patient was taken to the recovery room awake, extubated and in stable condition.   Kieth Carolin, MD Obstetrician & Gynecologist, [facility] for [employer], [facility]

## 2024-01-21 NOTE — Transfer of Care (Signed)
 Immediate Anesthesia Transfer of Care Note  Patient: Megan Powell  Procedure(s) Performed: HYSTERECTOMY, TOTAL, LAPAROSCOPIC CYSTOSCOPY SALPINGECTOMY, BILATERAL, LAPAROSCOPIC (Bilateral: Uterus)  Patient Location: PACU  Anesthesia Type:General  Level of Consciousness: drowsy  Airway & Oxygen Therapy: Patient Spontanous Breathing and Patient connected to nasal cannula oxygen  Post-op Assessment: Report given to RN, Post -op Vital signs reviewed and stable, and Patient moving all extremities  Post vital signs: Reviewed and stable  Last Vitals:  Vitals Value Taken Time  BP 125/69 01/21/24 19:15  Temp    Pulse 68 01/21/24 19:18  Resp 16 01/21/24 19:18  SpO2 100 % 01/21/24 19:18  Vitals shown include unfiled device data.  Last Pain:  Vitals:   01/21/24 0916  TempSrc: Oral  PainSc: 0-No pain      Patients Stated Pain Goal: 8 (01/21/24 0916)  Complications: No notable events documented.

## 2024-01-21 NOTE — Anesthesia Postprocedure Evaluation (Signed)
 Anesthesia Post Note  Patient: Megan Powell  Procedure(s) Performed: HYSTERECTOMY, TOTAL, LAPAROSCOPIC CYSTOSCOPY SALPINGECTOMY, BILATERAL, LAPAROSCOPIC (Bilateral: Uterus)     Patient location during evaluation: PACU Anesthesia Type: General Level of consciousness: awake and alert Pain management: pain level controlled Vital Signs Assessment: post-procedure vital signs reviewed and stable Respiratory status: spontaneous breathing, nonlabored ventilation, respiratory function stable and patient connected to nasal cannula oxygen Cardiovascular status: blood pressure returned to baseline and stable Postop Assessment: no apparent nausea or vomiting Anesthetic complications: no   No notable events documented.  Last Vitals:  Vitals:   01/21/24 1945 01/21/24 2000  BP: 124/75 125/73  Pulse: (!) 59 (!) 58  Resp: 11 15  Temp:    SpO2: 100% 98%    Last Pain:  Vitals:   01/21/24 2003  TempSrc:   PainSc: 6                  Epifanio Lamar BRAVO

## 2024-01-21 NOTE — H&P (Signed)
 PRE OPERATIVE GYNECOLOGY VISIT  Subjective:  Megan Powell is a 33 y.o. [id number] with LMP 01/07/24 presenting for scheduled TLH/BS/cysto for AUB/pelvic pain, ovarian cyst  Heavy, painful cycles progressively worsening since 2021. Cycles regular but bleeding can last for 10-11 days. Will pass clots and change tampon q2 hours. Has tried ibuprofen , heat, and lysteda  without improvement. Declines hormonal management and desires definitive management with hysterectomy.   Her workup includes the following: - CBC 11/26/22 Hgb 13.4 - Pap 11/26/22 NILM/HPV neg - TSH 11/26/22 1.56 - PRL 09/28/22 10.4 - Total T 09/28/22 36 - Pelvic US  01/23/23 10 x 7 x 5cm uterus with 1.6cm EL with debris/clot, no masses, 3.5cm stable cyst with internal echos - SHG 03/20/23 normal uterus/ovaries; no lesions but thickened irregular endometrium with posterior being larger than anterior - EMB benign  Past Medical History:  Diagnosis Date   Abnormal uterine bleeding (AUB)    Acne    ADHD (attention deficit hyperactivity disorder)    History of gestational hypertension    Mild binge-eating disorder    Rh negative status during pregnancy 04/24/2019   Past Surgical History:  Procedure Laterality Date   TUBAL LIGATION N/A 04/25/2019   Procedure: POST PARTUM TUBAL LIGATION;  Surgeon: Eveline Lynwood MATSU, MD;  Location: [facility];  Service: Gynecology;  Laterality: N/A;      WITH FILSHIE CLIPS   WISDOM TOOTH EXTRACTION  2016   No current facility-administered medications on file prior to encounter.   Current Outpatient Medications on File Prior to Encounter  Medication Sig Dispense Refill   ibuprofen  (ADVIL ) 200 MG tablet Take 400 mg by mouth every 6 (six) hours as needed.     tretinoin  (RETIN-A ) 0.05 % cream Apply topically at bedtime. (Patient taking differently: Apply 1 Application topically 2 (two) times a week. BEDTIME) 45 g 0   No Known Allergies OB History     Gravida  4   Para  4   Term  4   Preterm       AB      Living  4      SAB      IAB      Ectopic      Multiple  0   Live Births  4          Social History   Socioeconomic History   Marital status: Married    Spouse name: Not on file   Number of children: Not on file   Years of education: Not on file   Highest education level: Master's degree (e.g., MA, MS, MEng, MEd, MSW, MBA)  Occupational History   Occupation: homemaker  Tobacco Use   Smoking status: Never   Smokeless tobacco: Never  Vaping Use   Vaping status: Never Used  Substance and Sexual Activity   Alcohol use: No    Alcohol/week: 0.0 standard drinks of alcohol   Drug use: Never   Sexual activity: Yes    Partners: Male    Birth control/protection: Surgical    Comment: tubal ligation  Other Topics Concern   Not on file  Social History Narrative   Not on file   Social Drivers of Health   Financial Resource Strain: Low Risk  (01/03/2024)   Overall Financial Resource Strain (CARDIA)    Difficulty of Paying Living Expenses: Not hard at all  Food Insecurity: No Food Insecurity (01/03/2024)   Hunger Vital Sign    Worried About Running Out of Food in the Last Year:  Never true    Ran Out of Food in the Last Year: Never true  Transportation Needs: No Transportation Needs (01/03/2024)   PRAPARE - Administrator, Civil Service (Medical): No    Lack of Transportation (Non-Medical): No  Physical Activity: Sufficiently Active (01/03/2024)   Exercise Vital Sign    Days of Exercise per Week: 5 days    Minutes of Exercise per Session: 60 min  Stress: No Stress Concern Present (01/03/2024)   [employer] of Occupational Health - Occupational Stress Questionnaire    Feeling of Stress: Only a little  Social Connections: Moderately Integrated (01/03/2024)   Social Connection and Isolation Panel    Frequency of Communication with Friends and Family: Three times a week    Frequency of Social Gatherings with Friends and Family: Once a week     Attends Religious Services: 1 to 4 times per year    Active Member of [employer] or Organizations: No    Attends Engineer, Structural: Not on file    Marital Status: Married  Recent Concern: Social Connections - Moderately Isolated (10/28/2023)   Social Connection and Isolation Panel    Frequency of Communication with Friends and Family: Twice a week    Frequency of Social Gatherings with Friends and Family: Twice a week    Attends Religious Services: Never    Database Administrator or Organizations: No    Attends Engineer, Structural: Not on file    Marital Status: Married  Intimate Partner Violence: Not on file   Objective:   Vitals:   01/14/24 1552 01/17/24 1043 01/21/24 0916  BP:   (!) 141/86  Pulse:   94  Resp:   17  Temp:   98.1 F (36.7 C)  TempSrc:   Oral  SpO2:   99%  Weight: 78.9 kg 78.4 kg 78.4 kg  Height: 5' 4 (1.626 m) 5' 4 (1.626 m) 5' 4 (1.626 m)   General:  Alert, oriented and cooperative. Patient is in no acute distress.  Skin: Skin is warm and dry. No rash noted.   Cardiovascular: Normal heart rate noted  Respiratory: Normal respiratory effort, no problems with respiration noted  Abdomen: Soft, non-tender, non-distended    Assessment and Plan:  Megan Powell is a 33 y.o. with AUB/pelvic pain and ovarian cyst presenting for scheduled TLH/BS/cysto, possible ovarian cystectomy   AUB, ovarian cyst - Diagnosis: AUB/dysmenorrhea - Planned surgery: total laparoscopic hysterectomy, bilateral salpingectomy, cystoscopy, possible ovarian cystectomy - Risks of surgery include but are not limited to:  Bleeding - Can bleed enough to need transfusion or need for additional surgeries I.e. conversation to open surgery.  Infection - The vagina can develop cuff infection Injury to surrounding organs/tissues (i.e. bowel/bladder/ureters) - discussed how each of these is managed I.e. bladder injury requires catheter for 10-14 days after surgery Need for  additional procedures - Would be specific to a complication or injury Wound complications - No specific wound unless converted to open surgery or laparoscopic Hospital re-admission - In the event of a delayed complication being recognized I.e. ureteral injury discussed Conversion to open surgery - reviewed some complications may necessitate or it may be the only way to complete the surgery.   VTE - Discussed risk of blood clots following delivery Incomplete resolution of symptoms - We discussed alternatives to hysterectomy including birth control options that will work better to regulate bleeding, I.e. IUD, endometrial ablation. We reviewed that hysterectomy is the highest risk option. She  desires definitive management.  - We discussed postop restrictions, precautions and expectations - Ancef  for pre op abx, pain per ERAS - To OR when ready  Kieth JAYSON Carolin, MD

## 2024-01-21 NOTE — Discharge Instructions (Signed)
 Post-surgical Instructions, Outpatient Surgery  You may expect to feel dizzy, weak, and drowsy for as long as 24 hours after receiving the medicine that made you sleep (anesthetic). For the first 24 hours after your surgery:   Do not drive a car, ride a bicycle, participate in physical activities, or take public transportation until you are done taking narcotic pain medicines  Do not drink alcohol or take tranquilizers.  Do not take medicine that has not been prescribed by your physicians.  Do not sign important papers or make important decisions while on narcotic pain medicines.  Have a responsible person with you.   CARE OF INCISION If you have a bandage, you may remove it in one day.  If there are steri-strips or dermabond, just let this loosen on its own.  You may shower on the first day after your surgery.  Do not sit in a tub bath for one week. Avoid heavy lifting (more than 10 pounds/4.5 kilograms), pushing, or pulling.  Avoid activities that may risk injury to your incisions.   PAIN MANAGEMENT Ibuprofen  600mg .  (This is the same as 3-200mg  over the counter tablets of Motrin  or ibuprofen .)  Take this every 6 hours or as needed for cramping.   Acetaminophen  1000mg  (This is the same as 2-500mg  over the counter extra strength tylenol ). Take this every 6 hours for the first 3 days or as needed afterwards for pain Oxycodone  5mg  For more severe pain, take one or two tablets every four to six hours as needed for pain control.  (Remember that narcotic pain medications increase your risk of constipation.  If this becomes a problem, you may take an over the counter laxative like miralax.)  DO'S AND DON'T'S Do not take a tub bath for 4 weeks.  You may shower on the first day after your surgery Do not do any heavy lifting for four weeks.  This increases the chance of bleeding. Do move around as you feel able.  Stairs are fine.  You may begin to exercise again as you feel able.  Do not lift any  weights for two weeks. Do not put anything in the vagina for twelve weeks--no tampons, intercourse, or douching.    REGULAR MEDIATIONS/VITAMINS: You may restart all of your regular medications as prescribed. You may restart all of your vitamins as you normally take them.    PLEASE CALL OR SEEK MEDICAL CARE IF: You have persistent nausea and vomiting.  You have trouble eating or drinking.  You have an oral temperature above 100.5.  You have constipation that is not helped by adjusting diet or increasing fluid intake. Pain medicines are a common cause of constipation.  You have heavy vaginal bleeding You have redness or drainage from your incision(s) or there is increasing pain or tenderness near or in the surgical site.

## 2024-01-22 ENCOUNTER — Encounter (HOSPITAL_COMMUNITY): Payer: Self-pay | Admitting: Obstetrics and Gynecology

## 2024-01-22 NOTE — Addendum Note (Signed)
 Addendum  created 01/22/24 0008 by Darlyn Rush, MD   Attestation recorded in Intraprocedure, Intraprocedure Attestations filed

## 2024-01-28 ENCOUNTER — Ambulatory Visit: Payer: Self-pay | Admitting: Obstetrics and Gynecology

## 2024-01-28 LAB — SURGICAL PATHOLOGY

## 2024-01-29 LAB — GENECONNECT MOLECULAR SCREEN: Genetic Analysis Overall Interpretation: NEGATIVE

## 2024-02-14 ENCOUNTER — Encounter: Payer: Self-pay | Admitting: Medical

## 2024-02-17 ENCOUNTER — Encounter: Payer: Self-pay | Admitting: Obstetrics and Gynecology

## 2024-02-17 ENCOUNTER — Ambulatory Visit: Admitting: Obstetrics and Gynecology

## 2024-02-17 VITALS — BP 136/82 | HR 84 | Ht 64.0 in | Wt 166.0 lb

## 2024-02-17 DIAGNOSIS — Z09 Encounter for follow-up examination after completed treatment for conditions other than malignant neoplasm: Secondary | ICD-10-CM

## 2024-02-17 NOTE — Progress Notes (Signed)
" ° °  POST OPERATIVE GYNECOLOGY VISIT  Subjective:  Megan Powell is a 33 y.o. [id number] 4wk s/p TLH/BS/cysto for AUB/pelvic pain here for post op appt  Doing great. Hasn't needed anything for pain after the first 2 weeks. Eating/drinking normally, ambulating normally & has to remind herself to stick with post op restrictions since she has been feeling so good. Ovulated and didn't have her usual ovulation pain!   Path reviewed & benign Intra op images limited to specimen only unfortunately.  Left sided endometrioma was noted at time of surgery. Endometrioma was drained during surgery.  Objective:   Vitals:   02/17/24 1425  BP: 136/82  Pulse: 84  Weight: 166 lb (75.3 kg)  Height: 5' 4 (1.626 m)   General:  Alert, oriented and cooperative. Patient is in no acute distress.  Skin: Skin is warm and dry. No rash noted.   Cardiovascular: Normal heart rate noted  Respiratory: Normal respiratory effort, no problems with respiration noted  Abdomen: Soft, non-tender, non-distended. 4 lap sites well healed    Assessment and Plan:  Megan Powell is a 33 y.o. 4wk s/p TLH/BS/cysto  - Recovering well - Continue pelvic rest x 12 weeks total post op - OK to resume normal activities otherwise - Pap smears are no longer indicated - Reviewed diagnosis of endometriosis based on intra op findings. Discussed remaining on medications for hormonal suppression due to risk of recurrent pain, but she would like to manage expectantly for now  - Return to care PRN  Kieth JAYSON Carolin, MD  "

## 2024-03-06 NOTE — Telephone Encounter (Signed)
 All claims have been reprocessed.

## 2024-03-16 ENCOUNTER — Telehealth: Admitting: Medical-Surgical

## 2024-03-16 ENCOUNTER — Encounter: Payer: Self-pay | Admitting: Medical-Surgical

## 2024-03-16 DIAGNOSIS — F900 Attention-deficit hyperactivity disorder, predominantly inattentive type: Secondary | ICD-10-CM | POA: Diagnosis not present

## 2024-03-16 MED ORDER — LISDEXAMFETAMINE DIMESYLATE 60 MG PO CAPS: 60.0000 mg | ORAL_CAPSULE | Freq: Every day | ORAL | 0 refills | Status: AC

## 2024-03-16 NOTE — Progress Notes (Signed)
 Virtual Visit via Video Note  I connected with Megan Powell on 03/16/24 at  9:10 AM EST by a video enabled telemedicine application and verified that I am speaking with the correct person using two identifiers.   I discussed the limitations of evaluation and management by telemedicine and the availability of in person appointments. The patient expressed understanding and agreed to proceed.  Patient location: home Provider locations: office  Subjective:    CC: ADHD follow up  HPI: Pleasant 34 year old female presenting via MyChart video visit for follow up on ADHD. She reports taking a two week break from the Vyvanse  but now is back on it at 60mg  daily. Tolerating well without side effects, appetite suppression, or sleep disturbance. Feels that the medication is very helpful at this dose and does not desire a dose change.   Past medical history, Surgical history, Family history not pertinant except as noted below, Social history, Allergies, and medications have been entered into the medical record, reviewed, and corrections made.   Review of Systems: See HPI for pertinent positives and negatives.   Objective:    General: Speaking clearly in complete sentences without any shortness of breath.  Alert and oriented x3.  Normal judgment. No apparent acute distress.  Impression and Recommendations:    1. Attention deficit hyperactivity disorder (ADHD), predominantly inattentive type (Primary) Stable with well controlled symptoms. Continue Vyvanse  at 60mg  daily.  - lisdexamfetamine  (VYVANSE ) 60 MG capsule; Take 1 capsule (60 mg total) by mouth daily.  Dispense: 30 capsule; Refill: 0 - lisdexamfetamine  (VYVANSE ) 60 MG capsule; Take 1 capsule (60 mg total) by mouth daily.  Dispense: 30 capsule; Refill: 0 - lisdexamfetamine  (VYVANSE ) 60 MG capsule; Take 1 capsule (60 mg total) by mouth daily.  Dispense: 30 capsule; Refill: 0  I discussed the assessment and treatment plan with the patient. The  patient was provided an opportunity to ask questions and all were answered. The patient agreed with the plan and demonstrated an understanding of the instructions.   The patient was advised to call back or seek an in-person evaluation if the symptoms worsen or if the condition fails to improve as anticipated.  Return in about 6 months (around 09/13/2024) for ADHD follow up.  Zada FREDRIK Palin, DNP, APRN, FNP-BC [facility]

## 2024-03-20 ENCOUNTER — Ambulatory Visit: Admitting: Medical-Surgical
